# Patient Record
Sex: Female | Born: 1935 | Race: White | Hispanic: No | Smoking: Never smoker
Health system: Southern US, Community
[De-identification: ages and names within clinical notes are randomized; demographics above are authoritative.]

## PROBLEM LIST (undated history)

## (undated) DIAGNOSIS — Z952 Presence of prosthetic heart valve: Secondary | ICD-10-CM

## (undated) DIAGNOSIS — Z950 Presence of cardiac pacemaker: Secondary | ICD-10-CM

## (undated) DIAGNOSIS — Z8673 Personal history of transient ischemic attack (TIA), and cerebral infarction without residual deficits: Secondary | ICD-10-CM

## (undated) DIAGNOSIS — E785 Hyperlipidemia, unspecified: Secondary | ICD-10-CM

## (undated) DIAGNOSIS — I10 Essential (primary) hypertension: Secondary | ICD-10-CM

## (undated) HISTORY — PX: BREAST BIOPSY: SHX20

## (undated) HISTORY — PX: TONSILLECTOMY: SUR1361

## (undated) HISTORY — DX: Essential (primary) hypertension: I10

---

## 2014-01-18 DIAGNOSIS — M76899 Other specified enthesopathies of unspecified lower limb, excluding foot: Secondary | ICD-10-CM | POA: Diagnosis not present

## 2014-03-09 DIAGNOSIS — D259 Leiomyoma of uterus, unspecified: Secondary | ICD-10-CM | POA: Diagnosis not present

## 2014-04-21 DIAGNOSIS — E785 Hyperlipidemia, unspecified: Secondary | ICD-10-CM | POA: Diagnosis not present

## 2014-04-21 DIAGNOSIS — Z8673 Personal history of transient ischemic attack (TIA), and cerebral infarction without residual deficits: Secondary | ICD-10-CM | POA: Diagnosis not present

## 2014-04-21 DIAGNOSIS — Z79899 Other long term (current) drug therapy: Secondary | ICD-10-CM | POA: Diagnosis not present

## 2014-04-21 DIAGNOSIS — E559 Vitamin D deficiency, unspecified: Secondary | ICD-10-CM | POA: Diagnosis not present

## 2014-04-21 DIAGNOSIS — I129 Hypertensive chronic kidney disease with stage 1 through stage 4 chronic kidney disease, or unspecified chronic kidney disease: Secondary | ICD-10-CM | POA: Diagnosis not present

## 2014-04-21 DIAGNOSIS — D649 Anemia, unspecified: Secondary | ICD-10-CM | POA: Diagnosis not present

## 2014-04-21 DIAGNOSIS — N183 Chronic kidney disease, stage 3 (moderate): Secondary | ICD-10-CM | POA: Diagnosis not present

## 2014-07-07 DIAGNOSIS — Z1231 Encounter for screening mammogram for malignant neoplasm of breast: Secondary | ICD-10-CM | POA: Diagnosis not present

## 2014-07-13 DIAGNOSIS — R922 Inconclusive mammogram: Secondary | ICD-10-CM | POA: Diagnosis not present

## 2014-08-18 DIAGNOSIS — R110 Nausea: Secondary | ICD-10-CM | POA: Diagnosis not present

## 2014-08-18 DIAGNOSIS — K5900 Constipation, unspecified: Secondary | ICD-10-CM | POA: Diagnosis not present

## 2014-08-18 DIAGNOSIS — S80862A Insect bite (nonvenomous), left lower leg, initial encounter: Secondary | ICD-10-CM | POA: Diagnosis not present

## 2014-08-18 DIAGNOSIS — R109 Unspecified abdominal pain: Secondary | ICD-10-CM | POA: Diagnosis not present

## 2014-08-18 DIAGNOSIS — R1084 Generalized abdominal pain: Secondary | ICD-10-CM | POA: Diagnosis not present

## 2014-08-18 DIAGNOSIS — S80861A Insect bite (nonvenomous), right lower leg, initial encounter: Secondary | ICD-10-CM | POA: Diagnosis not present

## 2014-08-18 DIAGNOSIS — W57XXXA Bitten or stung by nonvenomous insect and other nonvenomous arthropods, initial encounter: Secondary | ICD-10-CM | POA: Diagnosis not present

## 2014-10-24 DIAGNOSIS — Z79899 Other long term (current) drug therapy: Secondary | ICD-10-CM | POA: Diagnosis not present

## 2014-10-24 DIAGNOSIS — E559 Vitamin D deficiency, unspecified: Secondary | ICD-10-CM | POA: Diagnosis not present

## 2014-10-24 DIAGNOSIS — Z6832 Body mass index (BMI) 32.0-32.9, adult: Secondary | ICD-10-CM | POA: Diagnosis not present

## 2014-10-24 DIAGNOSIS — Z0000 Encounter for general adult medical examination without abnormal findings: Secondary | ICD-10-CM | POA: Diagnosis not present

## 2014-10-24 DIAGNOSIS — R7309 Other abnormal glucose: Secondary | ICD-10-CM | POA: Diagnosis not present

## 2014-10-24 DIAGNOSIS — Z23 Encounter for immunization: Secondary | ICD-10-CM | POA: Diagnosis not present

## 2014-10-24 DIAGNOSIS — Z136 Encounter for screening for cardiovascular disorders: Secondary | ICD-10-CM | POA: Diagnosis not present

## 2014-10-24 DIAGNOSIS — N183 Chronic kidney disease, stage 3 (moderate): Secondary | ICD-10-CM | POA: Diagnosis not present

## 2014-10-24 DIAGNOSIS — E669 Obesity, unspecified: Secondary | ICD-10-CM | POA: Diagnosis not present

## 2014-10-24 DIAGNOSIS — I129 Hypertensive chronic kidney disease with stage 1 through stage 4 chronic kidney disease, or unspecified chronic kidney disease: Secondary | ICD-10-CM | POA: Diagnosis not present

## 2014-10-24 DIAGNOSIS — E785 Hyperlipidemia, unspecified: Secondary | ICD-10-CM | POA: Diagnosis not present

## 2014-10-24 DIAGNOSIS — D649 Anemia, unspecified: Secondary | ICD-10-CM | POA: Diagnosis not present

## 2014-10-26 DIAGNOSIS — H6123 Impacted cerumen, bilateral: Secondary | ICD-10-CM | POA: Diagnosis not present

## 2014-10-27 DIAGNOSIS — Z1211 Encounter for screening for malignant neoplasm of colon: Secondary | ICD-10-CM | POA: Diagnosis not present

## 2015-03-13 DIAGNOSIS — N952 Postmenopausal atrophic vaginitis: Secondary | ICD-10-CM | POA: Diagnosis not present

## 2015-03-13 DIAGNOSIS — R938 Abnormal findings on diagnostic imaging of other specified body structures: Secondary | ICD-10-CM | POA: Diagnosis not present

## 2015-03-13 DIAGNOSIS — D259 Leiomyoma of uterus, unspecified: Secondary | ICD-10-CM | POA: Diagnosis not present

## 2015-03-13 DIAGNOSIS — D252 Subserosal leiomyoma of uterus: Secondary | ICD-10-CM | POA: Diagnosis not present

## 2015-04-13 DIAGNOSIS — I6523 Occlusion and stenosis of bilateral carotid arteries: Secondary | ICD-10-CM | POA: Diagnosis not present

## 2015-04-19 DIAGNOSIS — I6523 Occlusion and stenosis of bilateral carotid arteries: Secondary | ICD-10-CM

## 2015-04-19 DIAGNOSIS — Z7982 Long term (current) use of aspirin: Secondary | ICD-10-CM | POA: Diagnosis not present

## 2015-04-19 DIAGNOSIS — E782 Mixed hyperlipidemia: Secondary | ICD-10-CM

## 2015-04-19 DIAGNOSIS — I10 Essential (primary) hypertension: Secondary | ICD-10-CM | POA: Diagnosis not present

## 2015-04-19 DIAGNOSIS — Z8673 Personal history of transient ischemic attack (TIA), and cerebral infarction without residual deficits: Secondary | ICD-10-CM

## 2015-04-19 HISTORY — DX: Personal history of transient ischemic attack (TIA), and cerebral infarction without residual deficits: Z86.73

## 2015-04-19 HISTORY — DX: Occlusion and stenosis of bilateral carotid arteries: I65.23

## 2015-04-19 HISTORY — DX: Mixed hyperlipidemia: E78.2

## 2015-04-25 DIAGNOSIS — Z9181 History of falling: Secondary | ICD-10-CM | POA: Diagnosis not present

## 2015-04-25 DIAGNOSIS — I129 Hypertensive chronic kidney disease with stage 1 through stage 4 chronic kidney disease, or unspecified chronic kidney disease: Secondary | ICD-10-CM | POA: Diagnosis not present

## 2015-04-25 DIAGNOSIS — Z1389 Encounter for screening for other disorder: Secondary | ICD-10-CM | POA: Diagnosis not present

## 2015-04-25 DIAGNOSIS — Z6832 Body mass index (BMI) 32.0-32.9, adult: Secondary | ICD-10-CM | POA: Diagnosis not present

## 2015-04-25 DIAGNOSIS — N183 Chronic kidney disease, stage 3 (moderate): Secondary | ICD-10-CM | POA: Diagnosis not present

## 2015-04-25 DIAGNOSIS — E559 Vitamin D deficiency, unspecified: Secondary | ICD-10-CM | POA: Diagnosis not present

## 2015-04-25 DIAGNOSIS — Z0000 Encounter for general adult medical examination without abnormal findings: Secondary | ICD-10-CM | POA: Diagnosis not present

## 2015-05-09 DIAGNOSIS — K219 Gastro-esophageal reflux disease without esophagitis: Secondary | ICD-10-CM | POA: Diagnosis not present

## 2015-05-09 DIAGNOSIS — N183 Chronic kidney disease, stage 3 (moderate): Secondary | ICD-10-CM | POA: Diagnosis not present

## 2015-05-09 DIAGNOSIS — I639 Cerebral infarction, unspecified: Secondary | ICD-10-CM | POA: Diagnosis not present

## 2015-05-09 DIAGNOSIS — I10 Essential (primary) hypertension: Secondary | ICD-10-CM | POA: Diagnosis not present

## 2015-05-09 DIAGNOSIS — R011 Cardiac murmur, unspecified: Secondary | ICD-10-CM | POA: Diagnosis not present

## 2015-05-09 DIAGNOSIS — E669 Obesity, unspecified: Secondary | ICD-10-CM | POA: Diagnosis not present

## 2015-05-09 DIAGNOSIS — I129 Hypertensive chronic kidney disease with stage 1 through stage 4 chronic kidney disease, or unspecified chronic kidney disease: Secondary | ICD-10-CM | POA: Diagnosis not present

## 2015-05-09 DIAGNOSIS — E785 Hyperlipidemia, unspecified: Secondary | ICD-10-CM | POA: Diagnosis not present

## 2015-05-11 DIAGNOSIS — R011 Cardiac murmur, unspecified: Secondary | ICD-10-CM | POA: Diagnosis not present

## 2015-05-11 DIAGNOSIS — I350 Nonrheumatic aortic (valve) stenosis: Secondary | ICD-10-CM | POA: Diagnosis not present

## 2015-05-11 DIAGNOSIS — I351 Nonrheumatic aortic (valve) insufficiency: Secondary | ICD-10-CM | POA: Diagnosis not present

## 2015-07-10 DIAGNOSIS — Z1231 Encounter for screening mammogram for malignant neoplasm of breast: Secondary | ICD-10-CM | POA: Diagnosis not present

## 2015-10-10 DIAGNOSIS — Z1389 Encounter for screening for other disorder: Secondary | ICD-10-CM | POA: Diagnosis not present

## 2015-10-10 DIAGNOSIS — N183 Chronic kidney disease, stage 3 (moderate): Secondary | ICD-10-CM | POA: Diagnosis not present

## 2015-10-10 DIAGNOSIS — I639 Cerebral infarction, unspecified: Secondary | ICD-10-CM | POA: Diagnosis not present

## 2015-10-10 DIAGNOSIS — E559 Vitamin D deficiency, unspecified: Secondary | ICD-10-CM | POA: Diagnosis not present

## 2015-10-10 DIAGNOSIS — Z79899 Other long term (current) drug therapy: Secondary | ICD-10-CM | POA: Diagnosis not present

## 2015-10-10 DIAGNOSIS — R5383 Other fatigue: Secondary | ICD-10-CM | POA: Diagnosis not present

## 2015-10-10 DIAGNOSIS — R8290 Unspecified abnormal findings in urine: Secondary | ICD-10-CM | POA: Diagnosis not present

## 2015-10-10 DIAGNOSIS — E785 Hyperlipidemia, unspecified: Secondary | ICD-10-CM | POA: Diagnosis not present

## 2015-10-10 DIAGNOSIS — R269 Unspecified abnormalities of gait and mobility: Secondary | ICD-10-CM | POA: Diagnosis not present

## 2015-10-10 DIAGNOSIS — M810 Age-related osteoporosis without current pathological fracture: Secondary | ICD-10-CM | POA: Diagnosis not present

## 2015-10-10 DIAGNOSIS — K219 Gastro-esophageal reflux disease without esophagitis: Secondary | ICD-10-CM | POA: Diagnosis not present

## 2015-10-10 DIAGNOSIS — I129 Hypertensive chronic kidney disease with stage 1 through stage 4 chronic kidney disease, or unspecified chronic kidney disease: Secondary | ICD-10-CM | POA: Diagnosis not present

## 2015-10-10 DIAGNOSIS — Z23 Encounter for immunization: Secondary | ICD-10-CM | POA: Diagnosis not present

## 2015-10-23 DIAGNOSIS — R2689 Other abnormalities of gait and mobility: Secondary | ICD-10-CM | POA: Diagnosis not present

## 2015-10-23 DIAGNOSIS — M6281 Muscle weakness (generalized): Secondary | ICD-10-CM | POA: Diagnosis not present

## 2015-10-25 DIAGNOSIS — M6281 Muscle weakness (generalized): Secondary | ICD-10-CM | POA: Diagnosis not present

## 2015-10-25 DIAGNOSIS — R2689 Other abnormalities of gait and mobility: Secondary | ICD-10-CM | POA: Diagnosis not present

## 2015-10-30 DIAGNOSIS — M6281 Muscle weakness (generalized): Secondary | ICD-10-CM | POA: Diagnosis not present

## 2015-10-30 DIAGNOSIS — R8290 Unspecified abnormal findings in urine: Secondary | ICD-10-CM | POA: Diagnosis not present

## 2015-10-30 DIAGNOSIS — R2689 Other abnormalities of gait and mobility: Secondary | ICD-10-CM | POA: Diagnosis not present

## 2015-10-31 DIAGNOSIS — Z6834 Body mass index (BMI) 34.0-34.9, adult: Secondary | ICD-10-CM | POA: Diagnosis not present

## 2015-10-31 DIAGNOSIS — E785 Hyperlipidemia, unspecified: Secondary | ICD-10-CM | POA: Diagnosis not present

## 2015-10-31 DIAGNOSIS — Z0000 Encounter for general adult medical examination without abnormal findings: Secondary | ICD-10-CM | POA: Diagnosis not present

## 2015-10-31 DIAGNOSIS — Z136 Encounter for screening for cardiovascular disorders: Secondary | ICD-10-CM | POA: Diagnosis not present

## 2015-10-31 DIAGNOSIS — I10 Essential (primary) hypertension: Secondary | ICD-10-CM | POA: Diagnosis not present

## 2015-11-01 DIAGNOSIS — M6281 Muscle weakness (generalized): Secondary | ICD-10-CM | POA: Diagnosis not present

## 2015-11-01 DIAGNOSIS — R2689 Other abnormalities of gait and mobility: Secondary | ICD-10-CM | POA: Diagnosis not present

## 2015-11-06 DIAGNOSIS — M6281 Muscle weakness (generalized): Secondary | ICD-10-CM | POA: Diagnosis not present

## 2015-11-06 DIAGNOSIS — N390 Urinary tract infection, site not specified: Secondary | ICD-10-CM | POA: Diagnosis not present

## 2015-11-06 DIAGNOSIS — R2689 Other abnormalities of gait and mobility: Secondary | ICD-10-CM | POA: Diagnosis not present

## 2015-11-08 DIAGNOSIS — R2689 Other abnormalities of gait and mobility: Secondary | ICD-10-CM | POA: Diagnosis not present

## 2015-11-08 DIAGNOSIS — M6281 Muscle weakness (generalized): Secondary | ICD-10-CM | POA: Diagnosis not present

## 2015-12-08 DIAGNOSIS — M85851 Other specified disorders of bone density and structure, right thigh: Secondary | ICD-10-CM | POA: Diagnosis not present

## 2015-12-08 DIAGNOSIS — M810 Age-related osteoporosis without current pathological fracture: Secondary | ICD-10-CM | POA: Diagnosis not present

## 2016-03-01 DIAGNOSIS — N183 Chronic kidney disease, stage 3 (moderate): Secondary | ICD-10-CM | POA: Diagnosis not present

## 2016-03-01 DIAGNOSIS — K219 Gastro-esophageal reflux disease without esophagitis: Secondary | ICD-10-CM | POA: Diagnosis not present

## 2016-03-01 DIAGNOSIS — M810 Age-related osteoporosis without current pathological fracture: Secondary | ICD-10-CM | POA: Diagnosis not present

## 2016-03-01 DIAGNOSIS — E559 Vitamin D deficiency, unspecified: Secondary | ICD-10-CM | POA: Diagnosis not present

## 2016-03-01 DIAGNOSIS — I639 Cerebral infarction, unspecified: Secondary | ICD-10-CM | POA: Diagnosis not present

## 2016-03-01 DIAGNOSIS — E785 Hyperlipidemia, unspecified: Secondary | ICD-10-CM | POA: Diagnosis not present

## 2016-03-01 DIAGNOSIS — I129 Hypertensive chronic kidney disease with stage 1 through stage 4 chronic kidney disease, or unspecified chronic kidney disease: Secondary | ICD-10-CM | POA: Diagnosis not present

## 2016-03-06 DIAGNOSIS — E875 Hyperkalemia: Secondary | ICD-10-CM | POA: Diagnosis not present

## 2016-04-18 DIAGNOSIS — I6523 Occlusion and stenosis of bilateral carotid arteries: Secondary | ICD-10-CM | POA: Diagnosis not present

## 2016-04-29 DIAGNOSIS — E785 Hyperlipidemia, unspecified: Secondary | ICD-10-CM | POA: Diagnosis not present

## 2016-04-29 DIAGNOSIS — I10 Essential (primary) hypertension: Secondary | ICD-10-CM | POA: Diagnosis not present

## 2016-05-09 DIAGNOSIS — Z8673 Personal history of transient ischemic attack (TIA), and cerebral infarction without residual deficits: Secondary | ICD-10-CM | POA: Diagnosis not present

## 2016-05-09 DIAGNOSIS — I6523 Occlusion and stenosis of bilateral carotid arteries: Secondary | ICD-10-CM | POA: Diagnosis not present

## 2016-06-06 DIAGNOSIS — E559 Vitamin D deficiency, unspecified: Secondary | ICD-10-CM | POA: Diagnosis not present

## 2016-06-06 DIAGNOSIS — R0602 Shortness of breath: Secondary | ICD-10-CM | POA: Diagnosis not present

## 2016-06-06 DIAGNOSIS — R5383 Other fatigue: Secondary | ICD-10-CM | POA: Diagnosis not present

## 2016-06-06 DIAGNOSIS — R7303 Prediabetes: Secondary | ICD-10-CM | POA: Diagnosis not present

## 2016-06-06 DIAGNOSIS — N183 Chronic kidney disease, stage 3 (moderate): Secondary | ICD-10-CM | POA: Diagnosis not present

## 2016-06-06 DIAGNOSIS — I129 Hypertensive chronic kidney disease with stage 1 through stage 4 chronic kidney disease, or unspecified chronic kidney disease: Secondary | ICD-10-CM | POA: Diagnosis not present

## 2016-06-06 DIAGNOSIS — I350 Nonrheumatic aortic (valve) stenosis: Secondary | ICD-10-CM | POA: Diagnosis not present

## 2016-06-06 DIAGNOSIS — E785 Hyperlipidemia, unspecified: Secondary | ICD-10-CM | POA: Diagnosis not present

## 2016-07-09 ENCOUNTER — Encounter: Payer: Self-pay | Admitting: Cardiology

## 2016-07-09 ENCOUNTER — Ambulatory Visit (INDEPENDENT_AMBULATORY_CARE_PROVIDER_SITE_OTHER): Payer: Medicare Other | Admitting: Cardiology

## 2016-07-09 DIAGNOSIS — I6930 Unspecified sequelae of cerebral infarction: Secondary | ICD-10-CM

## 2016-07-09 DIAGNOSIS — E785 Hyperlipidemia, unspecified: Secondary | ICD-10-CM

## 2016-07-09 DIAGNOSIS — I350 Nonrheumatic aortic (valve) stenosis: Secondary | ICD-10-CM | POA: Diagnosis not present

## 2016-07-09 DIAGNOSIS — I10 Essential (primary) hypertension: Secondary | ICD-10-CM | POA: Diagnosis not present

## 2016-07-09 DIAGNOSIS — R0609 Other forms of dyspnea: Secondary | ICD-10-CM | POA: Diagnosis not present

## 2016-07-09 HISTORY — DX: Nonrheumatic aortic (valve) stenosis: I35.0

## 2016-07-09 HISTORY — DX: Essential (primary) hypertension: I10

## 2016-07-09 HISTORY — DX: Hyperlipidemia, unspecified: E78.5

## 2016-07-09 NOTE — Patient Instructions (Addendum)
Medication Instructions:  Your physician recommends that you continue on your current medications as directed. Please refer to the Current Medication list given to you today.   Labwork: None     Testing/Procedures: Your physician has requested that you have an echocardiogram. Echocardiography is a painless test that uses sound waves to create images of your heart. It provides your doctor with information about the size and shape of your heart and how well your heart's chambers and valves are working. This procedure takes approximately one hour. There are no restrictions for this procedure.    Follow-Up: Your physician recommends that you schedule a follow-up appointment in: 3 weeks     Any Other Special Instructions Will Be Listed Below (If Applicable).     If you need a refill on your cardiac medications before your next appointment, please call your pharmacy.  Echocardiogram An echocardiogram, or echocardiography, uses sound waves (ultrasound) to produce an image of your heart. The echocardiogram is simple, painless, obtained within a short period of time, and offers valuable information to your health care provider. The images from an echocardiogram can provide information such as:  Evidence of coronary artery disease (CAD).  Heart size.  Heart muscle function.  Heart valve function.  Aneurysm detection.  Evidence of a past heart attack.  Fluid buildup around the heart.  Heart muscle thickening.  Assess heart valve function.  Tell a health care provider about:  Any allergies you have.  All medicines you are taking, including vitamins, herbs, eye drops, creams, and over-the-counter medicines.  Any problems you or family members have had with anesthetic medicines.  Any blood disorders you have.  Any surgeries you have had.  Any medical conditions you have.  Whether you are pregnant or may be pregnant. What happens before the procedure? No special  preparation is needed. Eat and drink normally. What happens during the procedure?  In order to produce an image of your heart, gel will be applied to your chest and a wand-like tool (transducer) will be moved over your chest. The gel will help transmit the sound waves from the transducer. The sound waves will harmlessly bounce off your heart to allow the heart images to be captured in real-time motion. These images will then be recorded.  You may need an IV to receive a medicine that improves the quality of the pictures. What happens after the procedure? You may return to your normal schedule including diet, activities, and medicines, unless your health care provider tells you otherwise. This information is not intended to replace advice given to you by your health care provider. Make sure you discuss any questions you have with your health care provider. Document Released: 12/22/1999 Document Revised: 08/12/2015 Document Reviewed: 08/31/2012 Elsevier Interactive Patient Education  2017 [employer].

## 2016-07-09 NOTE — Progress Notes (Signed)
Cardiology Consultation:    Date:  07/09/2016   ID:  Trish Fountain, DOB 1936/01/07, MRN [id number]  PCP:  Maylon Cos, NP  Cardiologist:  Jenne Campus, MD   Referring MD: Maylon Cos, NP   Chief Complaint  Patient presents with  . Fatigue  . Shortness of Breath  Increased shortness of breath, history of aortic stenosis  History of Present Illness:    Robin Sutton is a 81 y.o. female who is being seen today for the evaluation of Shortness of breath at the request of Maylon Cos, NP. She's been complaining of having some shortness of breath that is gradually getting worse for the last 6 months. She is down grading no symptoms over her daughter was with her in the room clearly stated that her shortness of breath significantly worsened. As having the chest pain, tightness, squeezing, pressure burning in the chest, no dizziness or passing out. She can walk in [place] from front to the back very slowly but without any symptoms. Apparently she was diagnosed with aortic stenosis last year and estimation was moderate at the time. Obviously now concern is about potentially having severe aortic stenosis. I asked her to avoid any strenuous exercise. Will get echocardiogram to assess that.  Past Medical History:  Diagnosis Date  . Hypertension     Past Surgical History:  Procedure Laterality Date  . BREAST BIOPSY    . TONSILLECTOMY      Current Medications: Current Meds  Medication Sig  . aspirin 325 MG tablet Take 325 mg by mouth daily.  Marland Kitchen lisinopril (PRINIVIL,ZESTRIL) 10 MG tablet Take 10 mg by mouth daily.  Marland Kitchen omeprazole (PRILOSEC) 20 MG capsule Take 20 mg by mouth daily.  . polyethylene glycol powder (GLYCOLAX/MIRALAX) powder Take 17 g by mouth as needed for constipation.  . pravastatin (PRAVACHOL) 40 MG tablet Take 40 mg by mouth daily.  . Vitamin D, Ergocalciferol, (DRISDOL) 50000 units CAPS capsule Take 50,000 Units by mouth once a week.     Allergies:    Patient has no known allergies.   Social History   Social History  . Marital status: Widowed    Spouse name: N/A  . Number of children: N/A  . Years of education: N/A   Social History Main Topics  . Smoking status: Never Smoker  . Smokeless tobacco: Never Used  . Alcohol use No  . Drug use: No  . Sexual activity: Not Asked   Other Topics Concern  . None   Social History Narrative  . None     Family History: The patient's family history includes Diabetes in her maternal grandfather; Heart disease in her brother, maternal grandfather, and mother; Hypertension in her brother and mother; Kidney failure in her father; Stroke in her mother. ROS:   Please see the history of present illness.     All other systems reviewed and are negative.    Recent Labs: No results found for requested labs within last 8760 hours.  Recent Lipid Panel No results found for: CHOL, TRIG, HDL, CHOLHDL, VLDL, LDLCALC, LDLDIRECT  Physical Exam:    VS:  BP 120/64   Pulse 76   Resp 12   Ht 5\' 3"  (1.6 m)   Wt 187 lb (84.8 kg)   BMI 33.13 kg/m     Wt Readings from Last 3 Encounters:  07/09/16 187 lb (84.8 kg)     GEN:  Well nourished, well developed in no acute distress HEENT: Normal NECK: No JVD;  No carotid bruits LYMPHATICS: No lymphadenopathy CARDIAC: RRR, There is systolic ejection murmur grade 3/6 best heard at the right upper portion of the sternum, S3 still present, radiation is to worsen next, there is also a holosystolic murmur best heart left border disturbance grade 1-2/6., no rubs, no gallops RESPIRATORY:  Clear to auscultation without rales, wheezing or rhonchi  ABDOMEN: Soft, non-tender, non-distended MUSCULOSKELETAL:  No edema; No deformity  SKIN: Warm and dry NEUROLOGIC:  Alert and oriented x 3 PSYCHIATRIC:  Normal affect   ASSESSMENT:    1. Nonrheumatic aortic valve stenosis   2. Dyspnea on exertion   3. Dyslipidemia   4. Essential hypertension   5. Late effect of  cerebrovascular accident (CVA)    PLAN:    In order of problems listed above:  1. Aortic stenosis: Was moderate last year S2 is still present however her symptoms that she develops a really very concerning. I asked her to avoid any strenuous exercise. Will get echocardiogram to assess the valve. I'm oriented to ridge significant aortic stenosis and she will require some intervention. 2. Essential hypertension: On appropriate medications which I will continue. 3. Dyslipidemia. LDL last assessment was 06/06/2016 was 117. She is ulnar pravastatin most likely will increase the dose of the medications which were tomorrow fact stopped. 4. Late effect of CVA she did have a stroke about 6 years ago affected the right side of her body. Her however quite nicely. The etiology of CVAs and clear. Oversee atrial fibrillation should be considered. Will look at the size of the left atrium and see if she will quite any monitoring device trended them and if she does have any evidence of atrial fibrillation.  Lytic into my office referral because of increased shortness of breath she does have aortic stenosis more she may have significant aortic stenosis obviously echocardiogram will be down to assess this issue. I will see her back in THE next 3-4 weeks. Office leave her aortic stenosis critical will see her much quicker.   Medication Adjustments/Labs and Tests Ordered: Current medicines are reviewed at length with the patient today.  Concerns regarding medicines are outlined above.  No orders of the defined types were placed in this encounter.  No orders of the defined types were placed in this encounter.   Signed, Jenne Campus, MD  07/09/2016 10:38 AM    [facility]

## 2016-07-16 DIAGNOSIS — Z1231 Encounter for screening mammogram for malignant neoplasm of breast: Secondary | ICD-10-CM | POA: Diagnosis not present

## 2016-07-18 ENCOUNTER — Ambulatory Visit (HOSPITAL_BASED_OUTPATIENT_CLINIC_OR_DEPARTMENT_OTHER)
Admission: RE | Admit: 2016-07-18 | Discharge: 2016-07-18 | Disposition: A | Discharge status: Home / Self Care | Payer: Medicare Other | Source: Ambulatory Visit | Attending: Cardiology | Admitting: Cardiology

## 2016-07-18 DIAGNOSIS — I351 Nonrheumatic aortic (valve) insufficiency: Secondary | ICD-10-CM | POA: Diagnosis not present

## 2016-07-18 DIAGNOSIS — I350 Nonrheumatic aortic (valve) stenosis: Secondary | ICD-10-CM | POA: Diagnosis not present

## 2016-07-18 NOTE — Progress Notes (Signed)
  Echocardiogram 2D Echocardiogram has been performed.  Donata Clay 07/18/2016, 2:13 PM

## 2016-07-31 ENCOUNTER — Ambulatory Visit (INDEPENDENT_AMBULATORY_CARE_PROVIDER_SITE_OTHER): Payer: Medicare Other | Admitting: Cardiology

## 2016-07-31 ENCOUNTER — Encounter: Payer: Self-pay | Admitting: Cardiology

## 2016-07-31 VITALS — BP 110/60 | HR 60 | Resp 10 | Ht 63.0 in | Wt 187.8 lb

## 2016-07-31 DIAGNOSIS — I10 Essential (primary) hypertension: Secondary | ICD-10-CM

## 2016-07-31 DIAGNOSIS — R0609 Other forms of dyspnea: Secondary | ICD-10-CM

## 2016-07-31 DIAGNOSIS — I350 Nonrheumatic aortic (valve) stenosis: Secondary | ICD-10-CM

## 2016-07-31 DIAGNOSIS — I6930 Unspecified sequelae of cerebral infarction: Secondary | ICD-10-CM | POA: Diagnosis not present

## 2016-07-31 NOTE — Patient Instructions (Signed)
Medication Instructions:  Your physician recommends that you continue on your current medications as directed. Please refer to the Current Medication list given to you today.  Labwork: None   Testing/Procedures: None   Follow-Up: Your physician recommends that you schedule a follow-up appointment in: 1 month with Dr. Agustin Cree.   Any Other Special Instructions Will Be Listed Below (If Applicable).     If you need a refill on your cardiac medications before your next appointment, please call your pharmacy.

## 2016-07-31 NOTE — Progress Notes (Signed)
Cardiology Office Note:    Date:  07/31/2016   ID:  Robin Sutton, DOB 1935-07-20, MRN [id number]  PCP:  Maylon Cos, NP  Cardiologist:  Jenne Campus, MD    Referring MD: Maylon Cos, NP   Chief Complaint  Patient presents with  . 3 week follow up  I'm still having shortness of breath  History of Present Illness:    Robin Sutton is a 81 y.o. female  with severe aortic stenosis. She did have echocardiogram which confirmed the diagnosis. She still does have exertional shortness of breath but no chest pain no passing out. She was in the room with her daughter. I explained to her what the options are option being open heart surgery to fix the valve, potentially trans-angiographic valve replacement, she told me straight that she does not want to have anything done about that. I told her that survival of people with aortic stenosis which is critical is about 3-5 years from the moment that the developed symptoms. She told me that she's had to reduce outcome. I asked her to think about it and let me know in about a month how she wanted to proceed. I also told her there is not much medication Y secondary to help her with this.  Past Medical History:  Diagnosis Date  . Hypertension     Past Surgical History:  Procedure Laterality Date  . BREAST BIOPSY    . TONSILLECTOMY      Current Medications: Current Meds  Medication Sig  . aspirin 325 MG tablet Take 325 mg by mouth daily.  Marland Kitchen lisinopril (PRINIVIL,ZESTRIL) 10 MG tablet Take 10 mg by mouth daily.  Marland Kitchen omeprazole (PRILOSEC) 20 MG capsule Take 20 mg by mouth daily.  . polyethylene glycol powder (GLYCOLAX/MIRALAX) powder Take 17 g by mouth as needed for constipation.  . pravastatin (PRAVACHOL) 40 MG tablet Take 40 mg by mouth daily.  . Vitamin D, Ergocalciferol, (DRISDOL) 50000 units CAPS capsule Take 50,000 Units by mouth once a week.     Allergies:   Patient has no known allergies.   Social History   Social  History  . Marital status: Widowed    Spouse name: N/A  . Number of children: N/A  . Years of education: N/A   Social History Main Topics  . Smoking status: Never Smoker  . Smokeless tobacco: Never Used  . Alcohol use No  . Drug use: No  . Sexual activity: Not Asked   Other Topics Concern  . None   Social History Narrative  . None     Family History: The patient's family history includes Diabetes in her maternal grandfather; Heart disease in her brother, maternal grandfather, and mother; Hypertension in her brother and mother; Kidney failure in her father; Stroke in her mother. ROS:   Please see the history of present illness.    All 14 point review of systems negative except as described per history of present illness  EKGs/Labs/Other Studies Reviewed:      Recent Labs: No results found for requested labs within last 8760 hours.  Recent Lipid Panel No results found for: CHOL, TRIG, HDL, CHOLHDL, VLDL, LDLCALC, LDLDIRECT  Physical Exam:    VS:  BP 110/60   Pulse 60   Resp 10   Ht 5\' 3"  (1.6 m)   Wt 187 lb 12.8 oz (85.2 kg)   BMI 33.27 kg/m     Wt Readings from Last 3 Encounters:  07/31/16 187 lb 12.8 oz (85.2 kg)  07/09/16 187 lb (84.8 kg)     GEN:  Well nourished, well developed in no acute distress HEENT: Normal NECK: No JVD; No carotid bruits LYMPHATICS: No lymphadenopathy CARDIAC: RRR, Systolic ejection murmur grade 3/6 best heard right upper portion of the sternum, no rubs, no gallops RESPIRATORY:  Clear to auscultation without rales, wheezing or rhonchi  ABDOMEN: Soft, non-tender, non-distended MUSCULOSKELETAL:  No edema; No deformity  SKIN: Warm and dry LOWER EXTREMITIES: no swelling NEUROLOGIC:  Alert and oriented x 3 PSYCHIATRIC:  Normal affect   ASSESSMENT:    1. Nonrheumatic aortic valve stenosis    PLAN:    In order of problems listed above:  1. Severe aortic stenosis: Patient refusing surgery. Again I will continue conversation about  this. I'll bring bring her back in my office in about one month. That she will avoid excess exercise. 2. Essential hypertension, on appropriate medications which I'll continue. 3. Dyspnea on exertion most likely caused by severe aortic stenosis. 4. Late effects of CVA. No new findings.   Medication Adjustments/Labs and Tests Ordered: Current medicines are reviewed at length with the patient today.  Concerns regarding medicines are outlined above.  No orders of the defined types were placed in this encounter.  Medication changes: No orders of the defined types were placed in this encounter.   Signed, Park Liter, MD, [facility] 07/31/2016 1:53 PM    [place]

## 2016-08-19 ENCOUNTER — Telehealth: Payer: Self-pay

## 2016-08-19 NOTE — Telephone Encounter (Signed)
Advised pt we would discuss at the upcoming f/u.

## 2016-08-19 NOTE — Telephone Encounter (Signed)
Patient called to set up operation discussed at last ov , please call back to do so.cn

## 2016-09-02 ENCOUNTER — Ambulatory Visit (INDEPENDENT_AMBULATORY_CARE_PROVIDER_SITE_OTHER): Payer: Medicare Other | Admitting: Cardiology

## 2016-09-02 ENCOUNTER — Encounter: Payer: Self-pay | Admitting: Cardiology

## 2016-09-02 ENCOUNTER — Ambulatory Visit (HOSPITAL_BASED_OUTPATIENT_CLINIC_OR_DEPARTMENT_OTHER)
Admission: RE | Admit: 2016-09-02 | Discharge: 2016-09-02 | Disposition: A | Discharge status: Home / Self Care | Payer: Medicare Other | Source: Ambulatory Visit | Attending: Cardiology | Admitting: Cardiology

## 2016-09-02 VITALS — BP 116/64 | HR 84 | Resp 14 | Ht 63.0 in | Wt 186.0 lb

## 2016-09-02 DIAGNOSIS — I350 Nonrheumatic aortic (valve) stenosis: Secondary | ICD-10-CM

## 2016-09-02 DIAGNOSIS — E785 Hyperlipidemia, unspecified: Secondary | ICD-10-CM

## 2016-09-02 DIAGNOSIS — R0602 Shortness of breath: Secondary | ICD-10-CM | POA: Diagnosis not present

## 2016-09-02 DIAGNOSIS — R0609 Other forms of dyspnea: Secondary | ICD-10-CM | POA: Diagnosis not present

## 2016-09-02 DIAGNOSIS — I10 Essential (primary) hypertension: Secondary | ICD-10-CM | POA: Diagnosis not present

## 2016-09-02 DIAGNOSIS — I6930 Unspecified sequelae of cerebral infarction: Secondary | ICD-10-CM | POA: Diagnosis not present

## 2016-09-02 DIAGNOSIS — R918 Other nonspecific abnormal finding of lung field: Secondary | ICD-10-CM | POA: Insufficient documentation

## 2016-09-02 DIAGNOSIS — Z01810 Encounter for preprocedural cardiovascular examination: Secondary | ICD-10-CM | POA: Insufficient documentation

## 2016-09-02 DIAGNOSIS — I700 Atherosclerosis of aorta: Secondary | ICD-10-CM | POA: Insufficient documentation

## 2016-09-02 NOTE — Patient Instructions (Addendum)
Medication Instructions:  Your physician recommends that you continue on your current medications as directed. Please refer to the Current Medication list given to you today.   Labwork: Your physician recommends that you return for lab work in: today. CBC, CMP, pt/inr   Testing/Procedures: A chest x-ray takes a picture of the organs and structures inside the chest, including the heart, lungs, and blood vessels. This test can show several things, including, whether the heart is enlarges; whether fluid is building up in the lungs; and whether pacemaker / defibrillator leads are still in place.  Your physician has requested that you have a cardiac catheterization. Cardiac catheterization is used to diagnose and/or treat various heart conditions. Doctors may recommend this procedure for a number of different reasons. The most common reason is to evaluate chest pain. Chest pain can be a symptom of coronary artery disease (CAD), and cardiac catheterization can show whether plaque is narrowing or blocking your heart's arteries. This procedure is also used to evaluate the valves, as well as measure the blood flow and oxygen levels in different parts of your heart. For further information please visit [facility]. Please follow instruction sheet, as given.   Follow-Up: Your physician recommends that you schedule a follow-up appointment in: 1 month.  Any Other Special Instructions Will Be Listed Below (If Applicable).     If you need a refill on your cardiac medications before your next appointment, please call your pharmacy.

## 2016-09-02 NOTE — Progress Notes (Signed)
Cardiology Office Note:    Date:  09/02/2016   ID:  Marisa Hage, DOB [birth year], MRN [id number]  PCP:  Maylon Cos, NP  Cardiologist:  Jenne Campus, MD    Referring MD: Maylon Cos, NP   Chief Complaint  Patient presents with  . 1 month follow up  She's here to tell about the aortic stenosis  History of Present Illness:    Robin Sutton is a 81 y.o. female  with significant aortic stenosis, latest echocardiogram showed mean gradient of 47. I met with her month ago we talked about options in this situation. She was unable to make a decision last time therefore I wanted to go home and think about it. Today she is coming to the decision. She would like to proceed with evaluation for aortic valve replacement. Told her that the first step is to cardiac catheterization which we will remeasure gradient across the aortic valve and more importantly check her coronary arteries. Based on that will know what her options will have. We talked about mechanical versus biological  Valve. We talked about cardiac catheterization, I expected all risks benefits as well as alternatives. We also talked about TAVR and hybrid procedure.  Past Medical History:  Diagnosis Date  . Hypertension     Past Surgical History:  Procedure Laterality Date  . BREAST BIOPSY    . TONSILLECTOMY      Current Medications: Current Meds  Medication Sig  . aspirin 325 MG tablet Take 325 mg by mouth daily.  Marland Kitchen lisinopril (PRINIVIL,ZESTRIL) 10 MG tablet Take 10 mg by mouth daily.  Marland Kitchen omeprazole (PRILOSEC) 20 MG capsule Take 20 mg by mouth daily.  . polyethylene glycol powder (GLYCOLAX/MIRALAX) powder Take 17 g by mouth as needed for constipation.  . pravastatin (PRAVACHOL) 40 MG tablet Take 40 mg by mouth daily.  . Vitamin D, Ergocalciferol, (DRISDOL) 50000 units CAPS capsule Take 50,000 Units by mouth once a week.     Allergies:   Patient has no known allergies.   Social History   Social History    . Marital status: Widowed    Spouse name: N/A  . Number of children: N/A  . Years of education: N/A   Social History Main Topics  . Smoking status: Never Smoker  . Smokeless tobacco: Never Used  . Alcohol use No  . Drug use: No  . Sexual activity: Not Asked   Other Topics Concern  . None   Social History Narrative  . None     Family History: The patient's family history includes Diabetes in her maternal grandfather; Heart disease in her brother, maternal grandfather, and mother; Hypertension in her brother and mother; Kidney failure in her father; Stroke in her mother. ROS:   Please see the history of present illness.    All 14 point review of systems negative except as described per history of present illness  EKGs/Labs/Other Studies Reviewed:      Recent Labs: No results found for requested labs within last 8760 hours.  Recent Lipid Panel No results found for: CHOL, TRIG, HDL, CHOLHDL, VLDL, LDLCALC, LDLDIRECT  Physical Exam:    VS:  BP 116/64   Pulse 84   Resp 14   Ht _0  (1.6 m)   Wt 186 lb (84.4 kg)   BMI 32.95 kg/m     Wt Readings from Last 3 Encounters:  09/02/16 186 lb (84.4 kg)  07/31/16 187 lb 12.8 oz (85.2 kg)  07/09/16 187 lb (84.8 kg)  GEN:  Well nourished, well developed in no acute distress HEENT: Normal NECK: No JVD; No carotid bruits LYMPHATICS: No lymphadenopathy CARDIAC: RRR, Systolic murmur grade 3/6 with radiation towards the neck, no rubs, no gallops RESPIRATORY:  Clear to auscultation without rales, wheezing or rhonchi  ABDOMEN: Soft, non-tender, non-distended MUSCULOSKELETAL:  No edema; No deformity  SKIN: Warm and dry LOWER EXTREMITIES: no swelling NEUROLOGIC:  Alert and oriented x 3 PSYCHIATRIC:  Normal affect   ASSESSMENT:    1. Nonrheumatic aortic valve stenosis   2. Essential hypertension   3. Dyslipidemia   4. Late effect of cerebrovascular accident (CVA)   5. Dyspnea on exertion    PLAN:    In order of  problems listed above:  1. Nonrheumatic aortic stenosis which appears to be severe. She agreed to have a cardiac catheterization to be evaluated for aortic valve replacement. 2. Essential hypertension: Blood pressure well-controlled continue present management. 3. Dyslipidemia: On pravastatin which I'll continue. 4. Late effects of CVA only minimal changes. 5. Dyspnea on exertion most likely related to aortic stenosis   Medication Adjustments/Labs and Tests Ordered: Current medicines are reviewed at length with the patient today.  Concerns regarding medicines are outlined above.  No orders of the defined types were placed in this encounter.  Medication changes: No orders of the defined types were placed in this encounter.   Signed, Park Liter, MD, [facility] 09/02/2016 1:54 PM    [place]

## 2016-09-03 ENCOUNTER — Telehealth: Payer: Self-pay

## 2016-09-03 DIAGNOSIS — R9389 Abnormal findings on diagnostic imaging of other specified body structures: Secondary | ICD-10-CM

## 2016-09-03 LAB — CBC WITH DIFFERENTIAL/PLATELET
BASOS ABS: 0 10*3/uL (ref 0.0–0.2)
Basos: 0 %
EOS (ABSOLUTE): 0.3 10*3/uL (ref 0.0–0.4)
Eos: 5 %
HEMOGLOBIN: 13.2 g/dL (ref 11.1–15.9)
Hematocrit: 39.3 % (ref 34.0–46.6)
IMMATURE GRANS (ABS): 0 10*3/uL (ref 0.0–0.1)
Immature Granulocytes: 0 %
LYMPHS ABS: 1.4 10*3/uL (ref 0.7–3.1)
LYMPHS: 26 %
MCH: 31.1 pg (ref 26.6–33.0)
MCHC: 33.6 g/dL (ref 31.5–35.7)
MCV: 93 fL (ref 79–97)
MONOCYTES: 11 %
Monocytes Absolute: 0.6 10*3/uL (ref 0.1–0.9)
Neutrophils Absolute: 3.1 10*3/uL (ref 1.4–7.0)
Neutrophils: 58 %
Platelets: 184 10*3/uL (ref 150–379)
RBC: 4.25 x10E6/uL (ref 3.77–5.28)
RDW: 13.8 % (ref 12.3–15.4)
WBC: 5.4 10*3/uL (ref 3.4–10.8)

## 2016-09-03 LAB — COMPREHENSIVE METABOLIC PANEL
A/G RATIO: 2.1 (ref 1.2–2.2)
ALBUMIN: 4.5 g/dL (ref 3.5–4.8)
ALT: 19 IU/L (ref 0–32)
AST: 24 IU/L (ref 0–40)
Alkaline Phosphatase: 70 IU/L (ref 39–117)
BUN / CREAT RATIO: 19 (ref 12–28)
BUN: 18 mg/dL (ref 8–27)
Bilirubin Total: 0.4 mg/dL (ref 0.0–1.2)
CALCIUM: 9.4 mg/dL (ref 8.7–10.3)
CO2: 25 mmol/L (ref 20–29)
CREATININE: 0.94 mg/dL (ref 0.57–1.00)
Chloride: 102 mmol/L (ref 96–106)
GFR calc Af Amer: 67 mL/min/{1.73_m2} (ref >59)
GFR, EST NON AFRICAN AMERICAN: 58 mL/min/{1.73_m2} — AB (ref >59)
GLOBULIN, TOTAL: 2.1 g/dL (ref 1.5–4.5)
Glucose: 119 mg/dL — ABNORMAL HIGH (ref 65–99)
Potassium: 4.9 mmol/L (ref 3.5–5.2)
SODIUM: 142 mmol/L (ref 134–144)
Total Protein: 6.6 g/dL (ref 6.0–8.5)

## 2016-09-03 NOTE — Telephone Encounter (Signed)
Advised pt that Dr. Agustin Cree would like repeat C-Xray on Friday of this week.

## 2016-09-03 NOTE — Telephone Encounter (Signed)
-----   Message from Park Liter, MD sent at 09/03/2016 11:30 AM EDT ----- cxr showed round density in rt base, needs to have cxr repeated in few days

## 2016-09-06 ENCOUNTER — Ambulatory Visit (HOSPITAL_BASED_OUTPATIENT_CLINIC_OR_DEPARTMENT_OTHER)
Admission: RE | Admit: 2016-09-06 | Discharge: 2016-09-06 | Disposition: A | Discharge status: Home / Self Care | Payer: Medicare Other | Source: Ambulatory Visit | Attending: Cardiology | Admitting: Cardiology

## 2016-09-06 DIAGNOSIS — E785 Hyperlipidemia, unspecified: Secondary | ICD-10-CM | POA: Diagnosis not present

## 2016-09-06 DIAGNOSIS — R938 Abnormal findings on diagnostic imaging of other specified body structures: Secondary | ICD-10-CM | POA: Diagnosis not present

## 2016-09-06 DIAGNOSIS — R9389 Abnormal findings on diagnostic imaging of other specified body structures: Secondary | ICD-10-CM

## 2016-09-06 DIAGNOSIS — E559 Vitamin D deficiency, unspecified: Secondary | ICD-10-CM | POA: Diagnosis not present

## 2016-09-06 DIAGNOSIS — Z6832 Body mass index (BMI) 32.0-32.9, adult: Secondary | ICD-10-CM | POA: Diagnosis not present

## 2016-09-06 DIAGNOSIS — I129 Hypertensive chronic kidney disease with stage 1 through stage 4 chronic kidney disease, or unspecified chronic kidney disease: Secondary | ICD-10-CM | POA: Diagnosis not present

## 2016-09-06 DIAGNOSIS — N183 Chronic kidney disease, stage 3 (moderate): Secondary | ICD-10-CM | POA: Diagnosis not present

## 2016-09-06 DIAGNOSIS — Z9181 History of falling: Secondary | ICD-10-CM | POA: Diagnosis not present

## 2016-09-06 DIAGNOSIS — R0602 Shortness of breath: Secondary | ICD-10-CM | POA: Diagnosis not present

## 2016-09-06 DIAGNOSIS — Z1211 Encounter for screening for malignant neoplasm of colon: Secondary | ICD-10-CM | POA: Diagnosis not present

## 2016-09-06 DIAGNOSIS — I350 Nonrheumatic aortic (valve) stenosis: Secondary | ICD-10-CM | POA: Diagnosis not present

## 2016-09-06 DIAGNOSIS — Z1389 Encounter for screening for other disorder: Secondary | ICD-10-CM | POA: Diagnosis not present

## 2016-09-06 DIAGNOSIS — R7303 Prediabetes: Secondary | ICD-10-CM | POA: Diagnosis not present

## 2016-09-07 DIAGNOSIS — Z1211 Encounter for screening for malignant neoplasm of colon: Secondary | ICD-10-CM | POA: Diagnosis not present

## 2016-09-10 ENCOUNTER — Telehealth: Payer: Self-pay

## 2016-09-10 NOTE — Telephone Encounter (Signed)
Left detailed message per DPR.    Patient contacted pre-catheterization at [facility] scheduled for:  09/11/2016 @ 1300 Verified arrival time and place:  NT @ 1100 Confirmed AM meds to be taken pre-cath with sip of water: Take ASA 325  Patient must have responsible person to drive home post procedure and observe patient for 24 hours Addl concerns:  Left this nurse name and # if any further questions

## 2016-09-11 ENCOUNTER — Encounter (HOSPITAL_COMMUNITY)
Admission: RE | Disposition: A | Discharge status: Home / Self Care | Payer: Self-pay | Source: Ambulatory Visit | Attending: Cardiovascular Disease

## 2016-09-11 ENCOUNTER — Ambulatory Visit (HOSPITAL_COMMUNITY)
Admission: RE | Admit: 2016-09-11 | Discharge: 2016-09-11 | Disposition: A | Discharge status: Home / Self Care | Payer: Medicare Other | Source: Ambulatory Visit | Attending: Cardiovascular Disease | Admitting: Cardiovascular Disease

## 2016-09-11 DIAGNOSIS — I10 Essential (primary) hypertension: Secondary | ICD-10-CM | POA: Diagnosis not present

## 2016-09-11 DIAGNOSIS — I350 Nonrheumatic aortic (valve) stenosis: Secondary | ICD-10-CM | POA: Diagnosis not present

## 2016-09-11 DIAGNOSIS — Z8249 Family history of ischemic heart disease and other diseases of the circulatory system: Secondary | ICD-10-CM | POA: Insufficient documentation

## 2016-09-11 DIAGNOSIS — E785 Hyperlipidemia, unspecified: Secondary | ICD-10-CM | POA: Insufficient documentation

## 2016-09-11 DIAGNOSIS — I2584 Coronary atherosclerosis due to calcified coronary lesion: Secondary | ICD-10-CM | POA: Diagnosis not present

## 2016-09-11 DIAGNOSIS — Z7982 Long term (current) use of aspirin: Secondary | ICD-10-CM | POA: Diagnosis not present

## 2016-09-11 DIAGNOSIS — I2510 Atherosclerotic heart disease of native coronary artery without angina pectoris: Secondary | ICD-10-CM | POA: Insufficient documentation

## 2016-09-11 HISTORY — PX: RIGHT/LEFT HEART CATH AND CORONARY ANGIOGRAPHY: CATH118266

## 2016-09-11 LAB — POCT I-STAT 3, ART BLOOD GAS (G3+)
Bicarbonate: 24.3 mmol/L (ref 20.0–28.0)
O2 Saturation: 91 %
PCO2 ART: 38.8 mmHg (ref 32.0–48.0)
PH ART: 7.404 (ref 7.350–7.450)
TCO2: 25 mmol/L (ref 22–32)
pO2, Arterial: 61 mmHg — ABNORMAL LOW (ref 83.0–108.0)

## 2016-09-11 LAB — POCT I-STAT 3, VENOUS BLOOD GAS (G3P V)
ACID-BASE DEFICIT: 1 mmol/L (ref 0.0–2.0)
Bicarbonate: 23.3 mmol/L (ref 20.0–28.0)
O2 SAT: 66 %
PCO2 VEN: 36.7 mmHg — AB (ref 44.0–60.0)
PH VEN: 7.411 (ref 7.250–7.430)
PO2 VEN: 34 mmHg (ref 32.0–45.0)
TCO2: 24 mmol/L (ref 22–32)

## 2016-09-11 LAB — PROTIME-INR
INR: 1.11
PROTHROMBIN TIME: 14.2 s (ref 11.4–15.2)

## 2016-09-11 SURGERY — RIGHT/LEFT HEART CATH AND CORONARY ANGIOGRAPHY
Anesthesia: LOCAL

## 2016-09-11 MED ORDER — HEPARIN SODIUM (PORCINE) 1000 UNIT/ML IJ SOLN
INTRAMUSCULAR | Status: AC
  Filled 2016-09-11: qty 1

## 2016-09-11 MED ORDER — LIDOCAINE HCL (PF) 1 % IJ SOLN
INTRAMUSCULAR | Status: AC
  Filled 2016-09-11: qty 30

## 2016-09-11 MED ORDER — MIDAZOLAM HCL 2 MG/2ML IJ SOLN
INTRAMUSCULAR | Status: DC | PRN
  Administered 2016-09-11: 1 mg via INTRAVENOUS

## 2016-09-11 MED ORDER — HEPARIN (PORCINE) IN NACL 2-0.9 UNIT/ML-% IJ SOLN
INTRAMUSCULAR | Status: AC | PRN
  Administered 2016-09-11: 1000 mL

## 2016-09-11 MED ORDER — HEPARIN SODIUM (PORCINE) 1000 UNIT/ML IJ SOLN
INTRAMUSCULAR | Status: DC | PRN
  Administered 2016-09-11: 4000 [IU] via INTRAVENOUS

## 2016-09-11 MED ORDER — SODIUM CHLORIDE 0.9 % WEIGHT BASED INFUSION: 1.0000 mL/kg/h | INTRAVENOUS | Status: DC

## 2016-09-11 MED ORDER — ASPIRIN 81 MG PO CHEW: 324.0000 mg | CHEWABLE_TABLET | ORAL | Status: DC

## 2016-09-11 MED ORDER — VERAPAMIL HCL 2.5 MG/ML IV SOLN
INTRAVENOUS | Status: AC
  Filled 2016-09-11: qty 2

## 2016-09-11 MED ORDER — ONDANSETRON HCL 4 MG/2ML IJ SOLN: 4.0000 mg | Freq: Four times a day (QID) | INTRAMUSCULAR | Status: DC | PRN

## 2016-09-11 MED ORDER — SODIUM CHLORIDE 0.9 % IV SOLN: 250.0000 mL | INTRAVENOUS | Status: DC | PRN

## 2016-09-11 MED ORDER — IODIXANOL 320 MG/ML IV SOLN
INTRAVENOUS | Status: DC | PRN
  Administered 2016-09-11: 60 mL via INTRA_ARTERIAL

## 2016-09-11 MED ORDER — SODIUM CHLORIDE 0.9% FLUSH: 3.0000 mL | INTRAVENOUS | Status: DC | PRN

## 2016-09-11 MED ORDER — HEPARIN (PORCINE) IN NACL 2-0.9 UNIT/ML-% IJ SOLN
INTRAMUSCULAR | Status: AC
  Filled 2016-09-11: qty 1000

## 2016-09-11 MED ORDER — LIDOCAINE HCL (PF) 1 % IJ SOLN
INTRAMUSCULAR | Status: DC | PRN
  Administered 2016-09-11 (×2): 2 mL

## 2016-09-11 MED ORDER — IOPAMIDOL (ISOVUE-370) INJECTION 76%
INTRAVENOUS | Status: AC
  Filled 2016-09-11: qty 100

## 2016-09-11 MED ORDER — MIDAZOLAM HCL 2 MG/2ML IJ SOLN
INTRAMUSCULAR | Status: AC
  Filled 2016-09-11: qty 2

## 2016-09-11 MED ORDER — SODIUM CHLORIDE 0.9 % WEIGHT BASED INFUSION
3.0000 mL/kg/h | INTRAVENOUS | Status: AC
  Administered 2016-09-11: 3 mL/kg/h via INTRAVENOUS

## 2016-09-11 MED ORDER — FENTANYL CITRATE (PF) 100 MCG/2ML IJ SOLN
INTRAMUSCULAR | Status: DC | PRN
  Administered 2016-09-11: 25 ug via INTRAVENOUS

## 2016-09-11 MED ORDER — ACETAMINOPHEN 325 MG PO TABS: 650.0000 mg | ORAL_TABLET | ORAL | Status: DC | PRN

## 2016-09-11 MED ORDER — FENTANYL CITRATE (PF) 100 MCG/2ML IJ SOLN
INTRAMUSCULAR | Status: AC
  Filled 2016-09-11: qty 2

## 2016-09-11 MED ORDER — SODIUM CHLORIDE 0.9% FLUSH: 3.0000 mL | Freq: Two times a day (BID) | INTRAVENOUS | Status: DC

## 2016-09-11 MED ORDER — VERAPAMIL HCL 2.5 MG/ML IV SOLN
INTRAVENOUS | Status: DC | PRN
  Administered 2016-09-11: 8 mL via INTRA_ARTERIAL

## 2016-09-11 SURGICAL SUPPLY — 13 items
CATH BALLN WEDGE 5F 110CM (CATHETERS) ×2 IMPLANT
CATH IMPULSE 5F ANG/FL3.5 (CATHETERS) ×2 IMPLANT
CATH LAUNCHER 5F RADR (CATHETERS) ×1 IMPLANT
CATHETER LAUNCHER 5F RADR (CATHETERS) ×2
DEVICE RAD COMP TR BAND LRG (VASCULAR PRODUCTS) ×2 IMPLANT
GLIDESHEATH SLEND SS 6F .021 (SHEATH) ×2 IMPLANT
GUIDEWIRE INQWIRE 1.5J.035X260 (WIRE) ×1 IMPLANT
INQWIRE 1.5J .035X260CM (WIRE) ×2
KIT HEART LEFT (KITS) ×2 IMPLANT
PACK CARDIAC CATHETERIZATION (CUSTOM PROCEDURE TRAY) ×2 IMPLANT
SHEATH GLIDE SLENDER 4/5FR (SHEATH) ×2 IMPLANT
TRANSDUCER W/STOPCOCK (MISCELLANEOUS) ×2 IMPLANT
TUBING CIL FLEX 10 FLL-RA (TUBING) ×2 IMPLANT

## 2016-09-11 NOTE — Discharge Instructions (Signed)

## 2016-09-11 NOTE — Interval H&P Note (Signed)
History and Physical Interval Note:  09/11/2016 2:54 PM  Robin Sutton  has presented today for surgery, with the diagnosis of aortic stenosis  The various methods of treatment have been discussed with the patient and family. After consideration of risks, benefits and other options for treatment, the patient has consented to  Procedure(s): RIGHT/LEFT HEART CATH AND CORONARY ANGIOGRAPHY (N/A) as a surgical intervention .  The patient's history has been reviewed, patient examined, no change in status, stable for surgery.  I have reviewed the patient's chart and labs.  Questions were answered to the patient's satisfaction.     Sherren Mocha

## 2016-09-11 NOTE — H&P (View-Only) (Signed)
Cardiology Office Note:    Date:  09/02/2016   ID:  Robin Sutton, DOB [birth year], MRN [id number]  PCP:  Maylon Cos, NP  Cardiologist:  Jenne Campus, MD    Referring MD: Maylon Cos, NP   Chief Complaint  Patient presents with  . 1 month follow up  She's here to tell about the aortic stenosis  History of Present Illness:    Robin Sutton is a 81 y.o. female  with significant aortic stenosis, latest echocardiogram showed mean gradient of 47. I met with her month ago we talked about options in this situation. She was unable to make a decision last time therefore I wanted to go home and think about it. Today she is coming to the decision. She would like to proceed with evaluation for aortic valve replacement. Told her that the first step is to cardiac catheterization which we will remeasure gradient across the aortic valve and more importantly check her coronary arteries. Based on that will know what her options will have. We talked about mechanical versus biological  Valve. We talked about cardiac catheterization, I expected all risks benefits as well as alternatives. We also talked about TAVR and hybrid procedure.  Past Medical History:  Diagnosis Date  . Hypertension     Past Surgical History:  Procedure Laterality Date  . BREAST BIOPSY    . TONSILLECTOMY      Current Medications: Current Meds  Medication Sig  . aspirin 325 MG tablet Take 325 mg by mouth daily.  Marland Kitchen lisinopril (PRINIVIL,ZESTRIL) 10 MG tablet Take 10 mg by mouth daily.  Marland Kitchen omeprazole (PRILOSEC) 20 MG capsule Take 20 mg by mouth daily.  . polyethylene glycol powder (GLYCOLAX/MIRALAX) powder Take 17 g by mouth as needed for constipation.  . pravastatin (PRAVACHOL) 40 MG tablet Take 40 mg by mouth daily.  . Vitamin D, Ergocalciferol, (DRISDOL) 50000 units CAPS capsule Take 50,000 Units by mouth once a week.     Allergies:   Patient has no known allergies.   Social History   Social History    . Marital status: Widowed    Spouse name: N/A  . Number of children: N/A  . Years of education: N/A   Social History Main Topics  . Smoking status: Never Smoker  . Smokeless tobacco: Never Used  . Alcohol use No  . Drug use: No  . Sexual activity: Not Asked   Other Topics Concern  . None   Social History Narrative  . None     Family History: The patient's family history includes Diabetes in her maternal grandfather; Heart disease in her brother, maternal grandfather, and mother; Hypertension in her brother and mother; Kidney failure in her father; Stroke in her mother. ROS:   Please see the history of present illness.    All 14 point review of systems negative except as described per history of present illness  EKGs/Labs/Other Studies Reviewed:      Recent Labs: No results found for requested labs within last 8760 hours.  Recent Lipid Panel No results found for: CHOL, TRIG, HDL, CHOLHDL, VLDL, LDLCALC, LDLDIRECT  Physical Exam:    VS:  BP 116/64   Pulse 84   Resp 14   Ht _0  (1.6 m)   Wt 186 lb (84.4 kg)   BMI 32.95 kg/m     Wt Readings from Last 3 Encounters:  09/02/16 186 lb (84.4 kg)  07/31/16 187 lb 12.8 oz (85.2 kg)  07/09/16 187 lb (84.8 kg)  GEN:  Well nourished, well developed in no acute distress HEENT: Normal NECK: No JVD; No carotid bruits LYMPHATICS: No lymphadenopathy CARDIAC: RRR, Systolic murmur grade 3/6 with radiation towards the neck, no rubs, no gallops RESPIRATORY:  Clear to auscultation without rales, wheezing or rhonchi  ABDOMEN: Soft, non-tender, non-distended MUSCULOSKELETAL:  No edema; No deformity  SKIN: Warm and dry LOWER EXTREMITIES: no swelling NEUROLOGIC:  Alert and oriented x 3 PSYCHIATRIC:  Normal affect   ASSESSMENT:    1. Nonrheumatic aortic valve stenosis   2. Essential hypertension   3. Dyslipidemia   4. Late effect of cerebrovascular accident (CVA)   5. Dyspnea on exertion    PLAN:    In order of  problems listed above:  1. Nonrheumatic aortic stenosis which appears to be severe. She agreed to have a cardiac catheterization to be evaluated for aortic valve replacement. 2. Essential hypertension: Blood pressure well-controlled continue present management. 3. Dyslipidemia: On pravastatin which I'll continue. 4. Late effects of CVA only minimal changes. 5. Dyspnea on exertion most likely related to aortic stenosis   Medication Adjustments/Labs and Tests Ordered: Current medicines are reviewed at length with the patient today.  Concerns regarding medicines are outlined above.  No orders of the defined types were placed in this encounter.  Medication changes: No orders of the defined types were placed in this encounter.   Signed, Park Liter, MD, [facility] 09/02/2016 1:54 PM    [place]

## 2016-09-12 ENCOUNTER — Encounter (HOSPITAL_COMMUNITY): Payer: Self-pay | Admitting: Cardiovascular Disease

## 2016-09-12 ENCOUNTER — Other Ambulatory Visit: Payer: Self-pay

## 2016-09-12 DIAGNOSIS — I350 Nonrheumatic aortic (valve) stenosis: Secondary | ICD-10-CM

## 2016-09-19 ENCOUNTER — Encounter (HOSPITAL_COMMUNITY): Payer: Self-pay

## 2016-09-19 ENCOUNTER — Ambulatory Visit (HOSPITAL_COMMUNITY)
Admission: RE | Admit: 2016-09-19 | Discharge: 2016-09-19 | Disposition: A | Discharge status: Home / Self Care | Payer: Medicare Other | Source: Ambulatory Visit | Attending: Cardiovascular Disease | Admitting: Cardiovascular Disease

## 2016-09-19 ENCOUNTER — Ambulatory Visit (HOSPITAL_COMMUNITY): Payer: Medicare Other

## 2016-09-19 ENCOUNTER — Ambulatory Visit (HOSPITAL_COMMUNITY): Admission: RE | Admit: 2016-09-19 | Payer: Medicare Other | Source: Ambulatory Visit

## 2016-09-19 ENCOUNTER — Encounter (HOSPITAL_COMMUNITY): Payer: Medicare Other

## 2016-09-19 DIAGNOSIS — Z952 Presence of prosthetic heart valve: Secondary | ICD-10-CM | POA: Diagnosis not present

## 2016-09-19 DIAGNOSIS — I700 Atherosclerosis of aorta: Secondary | ICD-10-CM | POA: Diagnosis not present

## 2016-09-19 DIAGNOSIS — I6523 Occlusion and stenosis of bilateral carotid arteries: Secondary | ICD-10-CM | POA: Diagnosis not present

## 2016-09-19 DIAGNOSIS — I350 Nonrheumatic aortic (valve) stenosis: Secondary | ICD-10-CM

## 2016-09-19 DIAGNOSIS — D259 Leiomyoma of uterus, unspecified: Secondary | ICD-10-CM | POA: Diagnosis not present

## 2016-09-19 LAB — VAS US CAROTID
LCCAPSYS: 109 cm/s
LEFT ECA DIAS: -12 cm/s
LEFT VERTEBRAL DIAS: 6 cm/s
LICAPSYS: -55 cm/s
Left CCA dist dias: 9 cm/s
Left CCA dist sys: 54 cm/s
Left CCA prox dias: 13 cm/s
Left ICA dist dias: -12 cm/s
Left ICA dist sys: -50 cm/s
Left ICA prox dias: -17 cm/s
RCCADSYS: -60 cm/s
RIGHT CCA MID DIAS: -17 cm/s
RIGHT ECA DIAS: -12 cm/s
RIGHT VERTEBRAL DIAS: 8 cm/s
Right CCA prox dias: -15 cm/s
Right CCA prox sys: -83 cm/s

## 2016-09-19 MED ORDER — IOPAMIDOL (ISOVUE-370) INJECTION 76%
INTRAVENOUS | Status: AC
  Administered 2016-09-19: 100 mL
  Filled 2016-09-19: qty 100

## 2016-09-19 NOTE — Progress Notes (Signed)
VASCULAR LAB PRELIMINARY  PRELIMINARY  PRELIMINARY  PRELIMINARY  Carotid duplex completed.    Preliminary report:  Bilateral:  1-39% ICA stenosis.  Vertebral artery flow is antegrade.     Hairston,Robert, RVT, RDCS 09/19/2016, 4:56 PM

## 2016-09-20 ENCOUNTER — Encounter (HOSPITAL_COMMUNITY): Payer: Medicare Other

## 2016-09-24 ENCOUNTER — Encounter (HOSPITAL_COMMUNITY): Payer: Medicare Other

## 2016-09-24 ENCOUNTER — Other Ambulatory Visit (HOSPITAL_COMMUNITY): Payer: Medicare Other

## 2016-09-25 ENCOUNTER — Institutional Professional Consult (permissible substitution) (INDEPENDENT_AMBULATORY_CARE_PROVIDER_SITE_OTHER): Payer: Medicare Other | Admitting: Thoracic Surgery (Cardiothoracic Vascular Surgery)

## 2016-09-25 ENCOUNTER — Encounter: Payer: Self-pay | Admitting: Physical Therapy

## 2016-09-25 ENCOUNTER — Ambulatory Visit (HOSPITAL_COMMUNITY)
Admission: RE | Admit: 2016-09-25 | Discharge: 2016-09-25 | Disposition: A | Discharge status: Home / Self Care | Payer: Medicare Other | Source: Ambulatory Visit | Attending: Cardiovascular Disease | Admitting: Cardiovascular Disease

## 2016-09-25 ENCOUNTER — Ambulatory Visit: Payer: Medicare Other | Attending: Nurse Practitioner | Admitting: Physical Therapy

## 2016-09-25 ENCOUNTER — Encounter: Payer: Self-pay | Admitting: Thoracic Surgery (Cardiothoracic Vascular Surgery)

## 2016-09-25 VITALS — BP 130/71 | HR 79 | Resp 20 | Ht 63.0 in | Wt 183.0 lb

## 2016-09-25 DIAGNOSIS — R2689 Other abnormalities of gait and mobility: Secondary | ICD-10-CM | POA: Insufficient documentation

## 2016-09-25 DIAGNOSIS — I350 Nonrheumatic aortic (valve) stenosis: Secondary | ICD-10-CM | POA: Diagnosis not present

## 2016-09-25 LAB — PULMONARY FUNCTION TEST
DL/VA % PRED: 91 %
DL/VA: 4.29 ml/min/mmHg/L
DLCO unc % pred: 69 %
DLCO unc: 15.95 ml/min/mmHg
FEF 25-75 POST: 1.92 L/s
FEF 25-75 Pre: 1.59 L/sec
FEF2575-%Change-Post: 21 %
FEF2575-%PRED-POST: 136 %
FEF2575-%Pred-Pre: 112 %
FEV1-%CHANGE-POST: 3 %
FEV1-%PRED-PRE: 101 %
FEV1-%Pred-Post: 105 %
FEV1-PRE: 1.92 L
FEV1-Post: 1.99 L
FEV1FVC-%Change-Post: 7 %
FEV1FVC-%PRED-PRE: 102 %
FEV6-%Change-Post: -2 %
FEV6-%Pred-Post: 101 %
FEV6-%Pred-Pre: 104 %
FEV6-POST: 2.44 L
FEV6-PRE: 2.5 L
FEV6FVC-%Change-Post: 0 %
FEV6FVC-%PRED-POST: 106 %
FEV6FVC-%Pred-Pre: 105 %
FVC-%Change-Post: -3 %
FVC-%PRED-POST: 95 %
FVC-%PRED-PRE: 99 %
FVC-POST: 2.44 L
FVC-PRE: 2.53 L
PRE FEV6/FVC RATIO: 99 %
Post FEV1/FVC ratio: 82 %
Post FEV6/FVC ratio: 100 %
Pre FEV1/FVC ratio: 76 %
RV % pred: 92 %
RV: 2.13 L
TLC % PRED: 93 %
TLC: 4.57 L

## 2016-09-25 MED ORDER — ALBUTEROL SULFATE (2.5 MG/3ML) 0.083% IN NEBU
2.5000 mg | INHALATION_SOLUTION | Freq: Once | RESPIRATORY_TRACT | Status: AC
  Administered 2016-09-25: 2.5 mg via RESPIRATORY_TRACT

## 2016-09-25 NOTE — Therapy (Signed)
[place], [place], [address] Phone: [phone]   Fax:  [phone]  Physical Therapy Evaluation  Patient Details  Name: Robin Sutton MRN: [id number] Date of Birth: [birth year] Referring Provider: Dr. Sherren Mocha  Encounter Date: 09/25/2016      PT End of Session - 09/25/16 1508    Visit Number 1   PT Start Time 1426   PT Stop Time 2706   PT Time Calculation (min) 31 min      Past Medical History:  Diagnosis Date  . Hypertension   . Stroke [facility])     Past Surgical History:  Procedure Laterality Date  . BREAST BIOPSY    . RIGHT/LEFT HEART CATH AND CORONARY ANGIOGRAPHY N/A 09/11/2016   Procedure: RIGHT/LEFT HEART CATH AND CORONARY ANGIOGRAPHY;  Surgeon: Sherren Mocha, MD;  Location: [facility];  Service: Cardiovascular;  Laterality: N/A;  . TONSILLECTOMY      There were no vitals filed for this visit.       Subjective Assessment - 09/25/16 1428    Subjective Pt reports her daughter has noticed increased shortness of breath from the patient over the past 2-3 months. Pt does report noticing more fatigue and difficulty working in the yard and is unable to walk the distance to the mailbox (~2400 ft round trip)   Patient Stated Goals be able to walk to the mailbox and return to yardwork   Currently in Pain? No/denies            [facility] PT Assessment - 09/25/16 0001      Assessment   Medical Diagnosis severe aortic stenosis   Referring Provider Dr. Sherren Mocha   Onset Date/Surgical Date --  last 2-3 months     Precautions   Precautions None     Restrictions   Weight Bearing Restrictions No     Balance Screen   Has the patient fallen in the past 6 months Yes   How many times? 1   Has the patient had a decrease in activity level because of a fear of falling?  No   Is the patient reluctant to leave their home because of a fear of falling?  No     Home Environment   Living Environment Private residence   Living Arrangements Alone   Type of [place] entrance   [place] Two level;Laundry or work area in basement     Prior Function   Level of Independence Independent with community mobility without device     Posture/Postural Control   Posture/Postural Control Postural limitations   Postural Limitations Rounded Shoulders;Forward head  mild     ROM / Strength   AROM / PROM / Strength AROM;Strength     AROM   Overall AROM Comments grossly WNL     Strength   Overall Strength Comments grossly 5/5 except R side mild weakness from past CVA   Strength Assessment Site Hand   Right/Left hand Right;Left   Right Hand Grip (lbs) 47  R hand dominant   Left Hand Grip (lbs) 40     Ambulation/Gait   Gait Comments Pt wearing flats/sandals and not tennis shoes for eval. Pt demonstrates mild lateral trunk sway with gait. Gait distance is limited by 30% for age/gender and pt reports mild SOB with 6 minute walk.           [facility] Pre-Surgical Assessment - 09/25/16 0001    5 Meter Walk  Test- trial 1 6 sec   5 Meter Walk Test- trial 2 7 sec.    5 Meter Walk Test- trial 3 5 sec.   5 meter walk test average 6 sec   4 Stage Balance Test tolerated for:  6 sec.   4 Stage Balance Test Position 4   Sit To Stand Test- trial 1 14 sec.   ADL/IADL Independent with: Bathing;Meal prep;Dressing;Finances   ADL/IADL Needs Assistance with: Yard work   ADL/IADL Fraility Index Vulnerable   6 Minute Walk- Baseline yes   BP (mmHg) 118/70   HR (bpm) 76   02 Sat (%RA) 95 %   Modified Borg Scale for Dyspnea 0- Nothing at all   Perceived Rate of Exertion (Borg) 6-   6 Minute Walk Post Test yes   BP (mmHg) 139/76   HR (bpm) 124   02 Sat (%RA) 95 %   Modified Borg Scale for Dyspnea 2- Mild shortness of breath   Perceived Rate of Exertion (Borg) 12-   Aerobic Endurance Distance Walked 1080          Objective measurements completed on  examination: See above findings.                               Plan - 09/30/16 1508    Clinical Impression Statement see below   PT Frequency One time visit     Clinical Impression Statement: Pt is a 81 yo female presenting to OP PT for evaluation prior to possible TAVR surgery due to severe aortic stenosis. Pt reports onset of shortness of breath with activity approximately 2-3 months ago. Symptoms are limiting her ability to walk to her mailbox and do yardowrk. Pt presents with good ROM and strength, good balance and is not at high fall risk 4 stage balance test, ood walking speed and fair aerobic endurance per 6 minute walk test. Pt ambulated a total of 1080 feet in 6 minute walk. Based on the Short Physical Performance Battery, patient has a frailty rating of 10/12 with </= 5/12 considered frail.   Patient demonstrated the following deficits and impairments:     Visit Diagnosis: Other abnormalities of gait and mobility      G-Codes - 2016/09/30 1508    Functional Assessment Tool Used (Outpatient Only) 6 minute walk 1080'   Functional Limitation Mobility: Walking and moving around   Mobility: Walking and Moving Around Current Status [phone]) At least 20 percent but less than 40 percent impaired, limited or restricted   Mobility: Walking and Moving Around Goal Status [phone]) At least 20 percent but less than 40 percent impaired, limited or restricted   Mobility: Walking and Moving Around Discharge Status [phone]) At least 20 percent but less than 40 percent impaired, limited or restricted       Problem List Patient Active Problem List   Diagnosis Date Noted  . Severe aortic stenosis 07/09/2016  . Dyspnea on exertion 07/09/2016  . Dyslipidemia 07/09/2016  . Essential hypertension 07/09/2016  . Late effect of cerebrovascular accident (CVA) 07/09/2016    Romualdo Bolk, PT September 30, 2016, 3:09 PM  [facility] [address] [place], [place], [address] Phone: [phone]   Fax:  [phone]  Name: Robin Sutton MRN: [id number] Date of Birth: 12-Nov-1935

## 2016-09-25 NOTE — Progress Notes (Addendum)
[facility] CONSULTATION REPORT  Referring Provider is Sherren Mocha, MD  Primary Cardiologist is Park Liter, MD PCP is Maylon Cos, NP  Chief Complaint  Patient presents with  . Aortic Stenosis    Surgical eval for TAVR, review all studies    HPI:  Patient is a 81 year old female with history of aortic stenosis, hypertension, previous stroke, and chronic diastolic congestive heart failure referred for surgical consultation to discuss treatment options for management of severe symptomatic aortic stenosis. The patient states that she has known about the presence of a heart murmur for several years. 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 She still has very mild right-sided weakness but overall recovered uneventfully. She has remained entirely functionally independent although she admits that she lives a somewhat sedentary lifestyle. Over the past several months she has developed symptoms of exertional shortness breath and fatigue. She was seen in follow-up by her primary care physician and noted to have a prominent murmur on physical exam. She was referred to Dr. Agustin Cree and underwent transthoracic echocardiogram that revealed severe aortic stenosis with preserved left ventricular systolic function. She subsequently underwent left and right heart catheterization by Dr. Burt Knack confirming the presence of at least moderate aortic stenosis with mild nonobstructive coronary artery disease. CT angiography was performed and cardiothoracic surgical consultation was requested.  The patient has been widowed for 4 years having lost her husband to Parkinson's disease. She lives alone [place], relatively close to [place], [place].  She has been retired from the [employer] for more than 15 years. She has remained functionally independent  and without significant limitations, although she admits to some mild residual right-sided weakness related to the stroke she suffered 9 years ago.  Over the past 3-6 months she has developed exertional shortness of breath and fatigue. She denies any resting shortness of breath but she does get more short of breath lying flat in bed.  She has never had any chest pain or chest tightness. She denies PND, dizzy spells, or syncope. She has had mild lower extremity edema. She admits to living a fairly sedentary lifestyle. She denies any significant balance issues and she does not use a cane or walker for ambulation, although she admits that she stumbled and had one fall within the past year.  Past Medical History:  Diagnosis Date  . Hypertension   . Stroke [facility])     Past Surgical History:  Procedure Laterality Date  . BREAST BIOPSY    . RIGHT/LEFT HEART CATH AND CORONARY ANGIOGRAPHY N/A 09/11/2016   Procedure: RIGHT/LEFT HEART CATH AND CORONARY ANGIOGRAPHY;  Surgeon: Sherren Mocha, MD;  Location: [place] [facility];  Service: Cardiovascular;  Laterality: N/A;  . TONSILLECTOMY      Family History  Problem Relation Age of Onset  . Hypertension Mother   . Heart disease Mother   . Stroke Mother   . Kidney failure Father   . Hypertension Brother   . Heart disease Brother   . Heart disease Maternal Grandfather   . Diabetes Maternal Grandfather     Social History   Social History  . Marital status: Widowed    Spouse name: N/A  . Number of children: N/A  . Years of education: N/A   Occupational History  . Not on file.   Social History Main Topics  . Smoking status: Never Smoker  . Smokeless  tobacco: Never Used  . Alcohol use No  . Drug use: No  . Sexual activity: Not on file   Other Topics Concern  . Not on file   Social History Narrative  . No narrative on file    Current Outpatient Prescriptions  Medication Sig Dispense Refill  . aspirin 325 MG tablet Take 325 mg by mouth  daily.    Marland Kitchen lisinopril (PRINIVIL,ZESTRIL) 10 MG tablet Take 10 mg by mouth every evening.   1  . polyethylene glycol powder (GLYCOLAX/MIRALAX) powder Take 17 g by mouth as needed for constipation.    . pravastatin (PRAVACHOL) 40 MG tablet Take 40 mg by mouth every evening.   1  . Vitamin D, Ergocalciferol, (DRISDOL) 50000 units CAPS capsule Take 50,000 Units by mouth every Monday.   1   No current facility-administered medications for this visit.     No Known Allergies    Review of Systems:   General:  normal appetite, decreased energy, no weight gain, no weight loss, no fever  Cardiac:  no chest pain with exertion, no chest pain at rest, +SOB with exertion, no resting SOB, no PND, + orthopnea, no palpitations, no arrhythmia, no atrial fibrillation, + LE edema, no dizzy spells, no syncope  Respiratory:  + exertional shortness of breath, no home oxygen, no productive cough, no dry cough, no bronchitis, no wheezing, no hemoptysis, no asthma, no pain with inspiration or cough, no sleep apnea, no CPAP at night  GI:   no difficulty swallowing, no reflux, no frequent heartburn, no hiatal hernia, no abdominal pain, occasional constipation, no diarrhea, no hematochezia, no hematemesis, no melena  GU:   no dysuria,  no frequency, no urinary tract infection, no hematuria, no kidney stones, no kidney disease  Vascular:  no pain suggestive of claudication, no pain in feet, no leg cramps, + varicose veins, no DVT, no non-healing foot ulcer  Neuro:   + stroke, no TIA's, no seizures, no headaches, no temporary blindness one eye,  no slurred speech, no peripheral neuropathy, no chronic pain, very mild instability of gait, no memory/cognitive dysfunction  Musculoskeletal: mild arthritis, no joint swelling, no myalgias, no difficulty walking, normal mobility   Skin:   no rash, no itching, no skin infections, no pressure sores or ulcerations  Psych:   no anxiety, no depression, no nervousness, no unusual recent  stress  Eyes:   no blurry vision, no floaters, no recent vision changes, + wears glasses or contacts  ENT:   no hearing loss, no loose or painful teeth, upper dentures, last saw dentist approx 1 year ago  Hematologic:  no easy bruising, no abnormal bleeding, no clotting disorder, no frequent epistaxis  Endocrine:  no diabetes, does not check CBG's at home           Physical Exam:   BP 130/71   Pulse 79   Resp 20   Ht _0  (1.6 m)   Wt 183 lb (83 kg)   SpO2 97% Comment: RA  BMI 32.42 kg/m   General:  Mildly obese,  well-appearing  HEENT:  Unremarkable   Neck:   no JVD, no bruits, no adenopathy   Chest:   clear to auscultation, symmetrical breath sounds, no wheezes, no rhonchi   CV:   RRR, grade III/VI crescendo/decrescendo murmur heard best at sternal border,  no diastolic murmur  Abdomen:  soft, non-tender, no masses   Extremities:  warm, well-perfused, pulses palpable, trace bilateral LE edema  Rectal/GU  Deferred  Neuro:   Grossly non-focal and symmetrical throughout  Skin:   Clean and dry, no rashes, no breakdown   Diagnostic Tests:  Transthoracic Echocardiography  Patient:    Robin Sutton, Robin Sutton MR #:       [id number] Study Date: 07/18/2016 Gender:     F Age:        75 Height:     160 cm Weight:     84.8 kg BSA:        1.98 m^2 Pt. Status: Room:   ATTENDING    Sherryl Barters     Park Liter  REFERRING    Kras[facility], [place], Inpatient  cc:  ------------------------------------------------------------------- LV EF: 65% -   70%  ------------------------------------------------------------------- Indications:      Aortic stenosis 424.1.  ------------------------------------------------------------------- History:   PMH:  Increased shortness of breath.  Risk factors: Hypertension. Dyslipidemia.  ------------------------------------------------------------------- Study  Conclusions  - Left ventricle: The cavity size was normal. Systolic function was   vigorous. The estimated ejection fraction was in the range of 65%   to 70%. Doppler parameters are consistent with abnormal left   ventricular relaxation (grade 1 diastolic dysfunction). - Aortic valve: AV is thickened, calcified with restricted motion.   Peak and mean gradients through the valve are 81 and 47 mm Hg   respectively consistent with sevrere AS. There was moderate   regurgitation.  ------------------------------------------------------------------- Study data:  No prior study was available for comparison.  Study status:  Routine.  Procedure:  The patient reported no pain pre or post test. Transthoracic echocardiography. Image quality was adequate.  Study completion:  There were no complications. Transthoracic echocardiography.  M-mode, complete 2D, spectral Doppler, and color Doppler.  Birthdate:  Patient birthdate: 1935/07/25.  Age:  Patient is 81 yr old.  Sex:  Gender: female. BMI: 33.1 kg/m^2.  Blood pressure:     120/64  Patient status: Outpatient.  Study date:  Study date: 07/18/2016. Study time: 01:18 PM.  Location:  Echo laboratory.  -------------------------------------------------------------------  ------------------------------------------------------------------- Left ventricle:  The cavity size was normal. Systolic function was vigorous. The estimated ejection fraction was in the range of 65% to 70%. Doppler parameters are consistent with abnormal left ventricular relaxation (grade 1 diastolic dysfunction).  ------------------------------------------------------------------- Aortic valve:  AV is thickened, calcified with restricted motion. Peak and mean gradients through the valve are 81 and 47 mm Hg respectively consistent with sevrere AS.  Doppler:  There was moderate regurgitation.    VTI ratio of LVOT to aortic valve: 0.37. Valve area (VTI): 0.94 cm^2. Indexed  valve area (VTI): 0.48 cm^2/m^2. Peak velocity ratio of LVOT to aortic valve: 0.41. Valve area (Vmax): 1.05 cm^2. Indexed valve area (Vmax): 0.53 cm^2/m^2. Mean velocity ratio of LVOT to aortic valve: 0.35. Valve area (Vmean): 0.9 cm^2. Indexed valve area (Vmean): 0.45 cm^2/m^2. Mean gradient (S): 43 mm Hg. Peak gradient (S): 76 mm Hg.  ------------------------------------------------------------------- Mitral valve:   Mildly thickened leaflets .  Doppler:  There was trivial regurgitation.    Peak gradient (D): 4 mm Hg.  ------------------------------------------------------------------- Left atrium:  The atrium was normal in size.  ------------------------------------------------------------------- Right ventricle:  The cavity size was normal. Wall thickness was normal. Systolic function was normal.  ------------------------------------------------------------------- Pulmonic valve:    Structurally normal valve.   Cusp separation was normal.  Doppler:  Transvalvular velocity was within the normal range. There was trivial regurgitation.  ------------------------------------------------------------------- Tricuspid valve:   Structurally normal valve.  Leaflet separation was normal.  Doppler:  Transvalvular velocity was within the normal range. There was no regurgitation.  ------------------------------------------------------------------- Right atrium:  The atrium was normal in size.  ------------------------------------------------------------------- Measurements   Left ventricle                           Value          Reference  LV ID, ED, PLAX chordal          (L)     40.4  mm       43 - 52  LV ID, ES, PLAX chordal                  26.5  mm       23 - 38  LV fx shortening, PLAX chordal           34    %        >=29  LV PW thickness, ED                      11.5  mm       ----------  IVS/LV PW ratio, ED                      0.91           <=1.3  Stroke volume, 2D                         94    ml       ----------  Stroke volume/bsa, 2D                    48    ml/m^2   ----------  LV e&', lateral                           6.74  cm/s     ----------  LV E/e&', lateral                         14.11          ----------  LV e&', medial                            6.64  cm/s     ----------  LV E/e&', medial                          14.32          ----------  LV e&', average                           6.69  cm/s     ----------  LV E/e&', average                         14.22          ----------    Ventricular septum                       Value          Reference  IVS thickness, ED  10.5  mm       ----------    LVOT                                     Value          Reference  LVOT ID, S                               18    mm       ----------  LVOT area                                2.54  cm^2     ----------  LVOT peak velocity, S                    180   cm/s     ----------  LVOT mean velocity, S                    109   cm/s     ----------  LVOT VTI, S                              37    cm       ----------  LVOT peak gradient, S                    13    mm Hg    ----------    Aortic valve                             Value          Reference  Aortic valve peak velocity, S            435   cm/s     ----------  Aortic valve mean velocity, S            309   cm/s     ----------  Aortic valve VTI, S                      99.7  cm       ----------  Aortic mean gradient, S                  43    mm Hg    ----------  Aortic peak gradient, S                  76    mm Hg    ----------  VTI ratio, LVOT/AV                       0.37           ----------  Aortic valve area, VTI                   0.94  cm^2     ----------  Aortic valve area/bsa, VTI               0.48  cm^2/m^2 ----------  Velocity ratio, peak, LVOT/AV            0.41           ----------  Aortic valve area, peak velocity         1.05  cm^2     ----------  Aortic valve area/bsa,  peak              0.53  cm^2/m^2 ----------  velocity  Velocity ratio, mean, LVOT/AV            0.35           ----------  Aortic valve area, mean velocity         0.9   cm^2     ----------  Aortic valve area/bsa, mean              0.45  cm^2/m^2 ----------  velocity  Aortic regurg pressure half-time         493   ms       ----------    Aorta                                    Value          Reference  Aortic root ID, ED                       29    mm       ----------    Left atrium                              Value          Reference  LA ID, A-P, ES                           44    mm       ----------  LA ID/bsa, A-P                   (H)     2.23  cm/m^2   <=2.2  LA volume, S                             84.1  ml       ----------  LA volume/bsa, S                         42.6  ml/m^2   ----------  LA volume, ES, 1-p A4C                   79.5  ml       ----------  LA volume/bsa, ES, 1-p A4C               40.2  ml/m^2   ----------  LA volume, ES, 1-p A2C                   81.2  ml       ----------  LA volume/bsa, ES, 1-p A2C               41.1  ml/m^2   ----------    Mitral valve                             Value          Reference  Mitral E-wave peak velocity  95.1  cm/s     ----------  Mitral A-wave peak velocity              123   cm/s     ----------  Mitral deceleration time         (H)     320   ms       150 - 230  Mitral peak gradient, D                  4     mm Hg    ----------  Mitral E/A ratio, peak                   0.8            ----------    Pulmonary arteries                       Value          Reference  PA pressure, S, DP                       27    mm Hg    <=30    Tricuspid valve                          Value          Reference  Tricuspid regurg peak velocity           246   cm/s     ----------  Tricuspid peak RV-RA gradient            24    mm Hg    ----------    Right atrium                             Value          Reference  RA ID, S-I, ES,  A4C              (H)     50.9  mm       34 - 49  RA area, ES, A4C                 (H)     20.8  cm^2     8.3 - 19.5  RA volume, ES, A/L                       64.6  ml       ----------  RA volume/bsa, ES, A/L                   32.7  ml/m^2   ----------    Systemic veins                           Value          Reference  Estimated CVP                            3     mm Hg    ----------    Right ventricle                          Value  Reference  RV ID, minor axis, ED, A4C base          38.4  mm       ----------  TAPSE                                    29.1  mm       ----------  RV pressure, S, DP                       27    mm Hg    <=30  RV s&', lateral, S                        15.4  cm/s     ----------  Legend: (L)  and  (H)  mark values outside specified reference range.  ------------------------------------------------------------------- Prepared and Electronically Authenticated by  Dorris Carnes, M.D. 2018-07-12T14:36:38    RIGHT/LEFT HEART CATH AND CORONARY ANGIOGRAPHY  Conclusion   1. Mild-moderate diffuse nonobstructive CAD (left dominant) 2. Moderate aortic stenosis by cath data with a mean transvalvular gradient of 25 mmHg and AVA 1.3 square cm 3. Normal right heart hemodynamics  Plan: echo data reviewed and is diagnostic of severe aortic stenosis with a peak transaortic velocity of 4.5 m/s and mean gradient 47 mmHg. Will refer to cardiac surgery for multidisciplinary valve team evaluation.   Indications   Severe aortic stenosis [I35.0 (ICD-10-CM)]  Procedural Details/Technique   Technical Details INDICATION: Severe symptomatic aortic stenosis  PROCEDURAL DETAILS: There was an indwelling IV in a right antecubital vein. Using normal sterile technique, the IV was changed out for a 5 Fr brachial sheath over a 0.018 inch wire. The right wrist was then prepped, draped, and anesthetized with 1% lidocaine. Using the modified Seldinger technique a 5/6 French  Slender sheath was placed in the right radial artery. Intra-arterial verapamil was administered through the radial artery sheath. IV heparin was administered after a JR4 catheter was advanced into the central aorta. A Swan-Ganz catheter was used for the right heart catheterization. Standard protocol was followed for recording of right heart pressures and sampling of oxygen saturations. Fick cardiac output was calculated. Standard Judkins catheters were used for selective coronary angiography. The right subclavian was very tortuous and catheter manipulation was difficult. The RCA was selectively engaged with a 5 Fr Easy-Rad guide catheter. The LCA was engaged with a JR 3.5 diagnostic catheter. The aortic valve was crossed with a JR4 catheter directing a J-wire into the LV. Pullback pressure was recorded. There were no immediate procedural complications. The patient was transferred to the post catheterization recovery area for further monitoring.     Estimated blood loss <50 mL.  During this procedure the patient was administered the following to achieve and maintain moderate conscious sedation: Versed 1 mg, Fentanyl 25 mcg, while the patient's heart rate, blood pressure, and oxygen saturation were continuously monitored. The period of conscious sedation was 39 minutes, of which I was present face-to-face 100% of this time.    Coronary Findings   Dominance: Left  Left Anterior Descending  Prox LAD lesion, 40% stenosed. The lesion is eccentric. The lesion is calcified. Nonobstructive proximal LAD lesion.  Left Circumflex  Mid Cx lesion, 30% stenosed.  First Obtuse Marginal Branch  1st Mrg lesion, 40% stenosed. The lesion is calcified.  Right Coronary Artery  Vessel is small.  Left  Heart   Aortic Valve There is moderate aortic valve stenosis. The aortic valve is calcified. There is restricted aortic valve motion. Mean gradient 25 mmHg, AVA 1.3 square cm    Coronary Diagrams   Diagnostic Diagram        Implants     No implant documentation for this case.  PACS Images   Show images for CARDIAC CATHETERIZATION   Link to Procedure Log   Procedure Log    Hemo Data    Most Recent Value  Fick Cardiac Output 5.54 L/min  Fick Cardiac Output Index 2.96 (L/min)/BSA  Aortic Mean Gradient 25 mmHg  Aortic Peak Gradient 27 mmHg  Aortic Valve Area 1.30  Aortic Value Area Index 0.7 cm2/BSA  RA A Wave 12 mmHg  RA V Wave 9 mmHg  RA Mean 8 mmHg  RV Systolic Pressure 32 mmHg  RV Diastolic Pressure 5 mmHg  RV EDP 12 mmHg  PA Systolic Pressure 28 mmHg  PA Diastolic Pressure 10 mmHg  PA Mean 19 mmHg  PW A Wave 18 mmHg  PW V Wave 15 mmHg  PW Mean 14 mmHg  AO Systolic Pressure 875 mmHg  AO Diastolic Pressure 59 mmHg  AO Mean 84 mmHg  LV Systolic Pressure 643 mmHg  LV Diastolic Pressure 9 mmHg  LV EDP 17 mmHg  Arterial Occlusion Pressure Extended Systolic Pressure 329 mmHg  Arterial Occlusion Pressure Extended Diastolic Pressure 60 mmHg  Arterial Occlusion Pressure Extended Mean Pressure 87 mmHg  Left Ventricular Apex Extended Systolic Pressure 518 mmHg  Left Ventricular Apex Extended Diastolic Pressure 8 mmHg  Left Ventricular Apex Extended EDP Pressure 19 mmHg  QP/QS 1  TPVR Index 6.41 HRUI  TSVR Index 28.35 HRUI  PVR SVR Ratio 0.07  TPVR/TSVR Ratio 0.23     Cardiac TAVR CT  TECHNIQUE: The patient was scanned on a Siemens 841 slice Force scanner. A 100 kV retrospective scan was triggered in the ascending thoracic aorta at 140 HU's. Gantry rotation speed was 250 msecs and collimation was .6 mm. No beta blockade or nitro were given. The 3D data set was reconstructed in 5% intervals of the R-R cycle. Systolic and diastolic phases were analyzed on a dedicated work station using MPR, MIP and VRT modes. The patient received 80 cc of contrast.  FINDINGS: Aortic Valve: Tri leaflet, thickened and calcified with restricted motion  Aorta: Moderate mixed plaque in the  aortic arch and descending thoracic aorta. Some calcium in the intervalvular fibrosa and MAC  Sinotubular Junction:  25 mm  Ascending Thoracic Aorta:  36 mm  Aortic Arch:  27 mm  Descending Thoracic Aorta:  23 mm  Sinus of Valsalva Measurements:  Non-coronary:  28.4 mm  Right -coronary:  27.5 mm  Left -coronary:  27.4 mm  Coronary Artery Height above Annulus:  Left Main:  10.8 mm above annulus  Right Coronary:  11.9 mm above annulus  Virtual Basal Annulus Measurements:  Maximum/Minimum Diameter:  21.4 mm x 16.8 mm  Perimeter:  62 mm  Area:  308 mm2  Coronary Arteries:  Sufficient height above annulus for deployment  Optimum Fluoroscopic Angle for Delivery: LAO 33 degrees Cranial 5 degrees  IMPRESSION: 1) Calcified tri leaflet aortic valve with annular area of 308 mm2 suitable for a 20 mm Sapien 3 valve and perimeter of 62 mm suitable for a 23 mm Evolut Pro valve  2) Optimum angiographic angle for deployment LAO 33 degrees Cranial 5 degrees  3) No LAA thrombus  4) Aortic root  3.6 cm  5) Coronary arteries sufficient height above annulus for deployment  Jenkins Rouge   Electronically Signed   By: Jenkins Rouge M.D.   On: 09/19/2016 17:38    CT ANGIOGRAPHY CHEST, ABDOMEN AND PELVIS  TECHNIQUE: Multidetector CT imaging through the chest, abdomen and pelvis was performed using the standard protocol during bolus administration of intravenous contrast. Multiplanar reconstructed images and MIPs were obtained and reviewed to evaluate the vascular anatomy.  CONTRAST:  100 mL of Isovue 370.  COMPARISON:  No priors.  FINDINGS: CTA CHEST FINDINGS  Cardiovascular: Heart size is mildly enlarged. There is no significant pericardial fluid, thickening or pericardial calcification. There is aortic atherosclerosis, as well as atherosclerosis of the great vessels of the mediastinum and the coronary arteries, including calcified  atherosclerotic plaque in the left main, left anterior descending and left circumflex coronary arteries. Severe thickening calcification of the aortic valve. Calcification of the mitral annulus.  Mediastinum/Lymph Nodes: No pathologically enlarged mediastinal or hilar lymph nodes. Please note that accurate exclusion of hilar adenopathy is limited on noncontrast CT scans. Small hiatal hernia. No axillary lymphadenopathy.  Lungs/Pleura: 3 x 9 mm (mean diameter of 6 mm) subpleural nodule in the posterior aspect of the right lower lobe (axial image 68 of series 14). No other larger more suspicious appearing pulmonary nodules or masses. No acute consolidative airspace disease. No pleural effusions.  Musculoskeletal/Soft Tissues: There are no aggressive appearing lytic or blastic lesions noted in the visualized portions of the skeleton.  CTA ABDOMEN AND PELVIS FINDINGS  Hepatobiliary: Diffuse low attenuation throughout the hepatic parenchyma, strongly suggestive of hepatic steatosis (difficult to say for certain on today's contrast enhanced examination). No definite cystic or solid hepatic lesions are identified. No intra or extrahepatic biliary ductal dilatation. Gallbladder is unremarkable in appearance.  Pancreas: No pancreatic mass. No pancreatic ductal dilatation. No pancreatic or peripancreatic fluid or inflammatory changes.  Spleen: Unremarkable.  Adrenals/Urinary Tract: Bilateral kidneys and bilateral adrenal glands are normal in appearance. No hydroureteronephrosis. Urinary bladder is normal in appearance.  Stomach/Bowel: Normal appearance of the stomach. No pathologic dilatation of small bowel or colon. A few scattered colonic diverticulae are noted, without surrounding inflammatory changes to suggest an acute diverticulitis at this time. Normal appendix.  Vascular/Lymphatic: Aortic atherosclerosis, with vascular findings and measurements pertinent to  potential TAVR procedure, as detailed below. No aneurysm or dissection noted in the abdominal or pelvic vasculature. The celiac axis, superior mesenteric artery and inferior mesenteric artery are all patent without hemodynamically significant stenosis. Renal arteries are patent bilaterally (2 on the right and 1 on the left) without definite hemodynamically significant stenoses. No lymphadenopathy noted in the abdomen or pelvis.  Reproductive: 4.5 x 4.9 x 4.3 cm heterogeneously enhancing lesion in the right side of the uterine body, incompletely characterized but likely to represent a a leiomyoma. Ovaries are atrophic.  Other: No significant volume of ascites.  No pneumoperitoneum.  Musculoskeletal: There are no aggressive appearing lytic or blastic lesions noted in the visualized portions of the skeleton.  VASCULAR MEASUREMENTS PERTINENT TO TAVR:  AORTA:  Minimal Aortic Diameter -  14 x 14 mm  Severity of Aortic Calcification -  mild  RIGHT PELVIS:  Right Common Iliac Artery -  Minimal Diameter - 100. x 10.4 mm  Tortuosity - mild  Calcification - mild  Right External Iliac Artery -  Minimal Diameter - 8.3 x 7.7 mm  Tortuosity - severe  Calcification - mild  Right Common Femoral Artery -  Minimal Diameter -  7.7 x 8.7 mm  Tortuosity - mild  Calcification - mild  LEFT PELVIS:  Left Common Iliac Artery -  Minimal Diameter - 9.9 x 9.2 mm  Tortuosity - mild  Calcification - mild  Left External Iliac Artery -  Minimal Diameter - 8.2 x 7.9 mm  Tortuosity - severe  Calcification - minimal  Left Common Femoral Artery -  Minimal Diameter - 8.5 x 8.7 mm  Tortuosity - mild  Calcification - none  Review of the MIP images confirms the above findings.  IMPRESSION: 1. Vascular findings and measurements pertinent to potential TAVR procedure, as detailed above. This patient does have suitable pelvic arterial access  bilaterally. 2. Severe thickening calcification of the aortic valve, compatible with the reported clinical history of severe aortic stenosis. 3. Aortic atherosclerosis, in addition to left main and 2 vessel coronary artery disease. Assessment for potential risk factor modification, dietary therapy or pharmacologic therapy may be warranted, if clinically indicated. 4. Probable fibroid in the uterus, as discussed above. 5. Additional incidental findings, as above. Aortic Atherosclerosis (ICD10-I70.0).   Electronically Signed   By: Vinnie Langton M.D.   On: 09/20/2016 08:21     STS Risk Calculator Procedure: AV Replacement  Risk of Mortality: 2.283%  Morbidity or Mortality: 16.689%  Long Length of Stay: 7.878%  Short Length of Stay: 29.93%  Permanent Stroke: 2.871%  Prolonged Ventilation: 11.752%  DSW Infection: 0.252%  Renal Failure: 3.79%  Reoperation: 6.917%      Impression:  Patient has stage D severe symptomatic aortic stenosis. She describes a 3-6 month history of symptoms of exertional shortness of breath and fatigue consistent with chronic diastolic congestive heart failure, New [place] Heart Association functional class II. I personally reviewed the patient's recent transthoracic echocardiogram, diagnostic cardiac catheterization, and CT angiograms. Echocardiogram reveals a trileaflet aortic valve with moderate to severe thickening and calcification involving 2 of the 3 leaflets.  The third leaflet is only mildly thickened with mildly restricted mobility. Peak velocity across the aortic valve measured 4.3 m/s corresponding to mean transvalvular gradient estimated 43 mmHg. The DVI was 0.41, owing to the fact that the patient has relatively small sized aortic root. Left ventricular systolic function is hyperdynamic. Diagnostic cardiac catheterization is notable for the absence of significant coronary artery disease.  Overall I agree the patient would likely benefit from  aortic valve replacement, although the functional significance of her aortic stenosis is compounded by the presence of relatively small aortic root. Risks associated with conventional surgery would likely be at least moderately elevated and likely considerably higher than that predicted using STS risk calculator, because aortic root enlargement or replacement would be necessary to avoid leaving the patient with patient prosthesis mismatch.  Cardiac-gated CTA of the heart reveals anatomical characteristics consistent with aortic stenosis suitable for treatment by transcatheter aortic valve replacement without any significant complicating features other than the relatively small size of her aortic root, and CTA of the aorta and iliac vessels demonstrate what appears to be adequate pelvic vascular access to facilitate a transfemoral approach.  I favor proceeding with transcatheter aortic valve replacement using a supra-annular valve to minimize the chances of leaving the patient with significant patient prosthesis mismatch.    Plan:  The patient and her daughter were counseled at length regarding treatment alternatives for management of severe symptomatic aortic stenosis. Alternative approaches such as conventional aortic valve replacement, transcatheter aortic valve replacement, and palliative medical therapy were compared and contrasted at length.  The risks associated with  conventional surgical aortic valve replacement were been discussed in detail, as were expectations for post-operative convalescence.  Issues specific to transcatheter aortic valve replacement were discussed including questions about long term valve durability, the potential for paravalvular leak, possible increased risk of need for permanent pacemaker placement, and other technical complications related to the procedure itself.  Long-term prognosis with medical therapy was discussed. This discussion was placed in the context of the patient's  own specific clinical presentation and past medical history.  All of their questions been addressed.  The patient hopes to proceed with surgery as soon as practical. We tentatively plan for transcatheter aortic valve replacement via transfemoral approach on 10/08/2016.  Following the decision to proceed with transcatheter aortic valve replacement, a discussion has been held regarding what types of management strategies would be attempted intraoperatively in the event of life-threatening complications, including whether or not the patient would be considered a candidate for the use of cardiopulmonary bypass and/or conversion to open sternotomy for attempted surgical intervention.  The patient would desire to be considered candidate for emergency sternotomy if necessary. The patient has been advised of a variety of complications that might develop including but not limited to risks of death, stroke, paravalvular leak, aortic dissection or other major vascular complications, aortic annulus rupture, device embolization, cardiac rupture or perforation, mitral regurgitation, acute myocardial infarction, arrhythmia, heart block or bradycardia requiring permanent pacemaker placement, congestive heart failure, respiratory failure, renal failure, pneumonia, infection, other late complications related to structural valve deterioration or migration, or other complications that might ultimately cause a temporary or permanent loss of functional independence or other long term morbidity.  The patient provides full informed consent for the procedure as described and all questions were answered.   I spent in excess of 90 minutes during the conduct of this office consultation and >50% of this time involved direct face-to-face encounter with the patient for counseling and/or coordination of their care.   Valentina Gu. Roxy Manns, MD 09/25/2016 3:56 PM

## 2016-09-25 NOTE — Patient Instructions (Signed)
Continue all previous medications without any changes at this time  

## 2016-09-26 ENCOUNTER — Other Ambulatory Visit: Payer: Self-pay

## 2016-09-26 DIAGNOSIS — I350 Nonrheumatic aortic (valve) stenosis: Secondary | ICD-10-CM

## 2016-10-01 ENCOUNTER — Telehealth: Payer: Self-pay | Admitting: Cardiology

## 2016-10-01 NOTE — Telephone Encounter (Signed)
Dr. Agustin Cree has s/w the dentist personally.

## 2016-10-01 NOTE — Telephone Encounter (Signed)
Is having 7 teeth extracted and wants to know if she needs to be on an antibiotic

## 2016-10-02 ENCOUNTER — Encounter (HOSPITAL_COMMUNITY): Payer: Self-pay

## 2016-10-02 ENCOUNTER — Other Ambulatory Visit (HOSPITAL_COMMUNITY): Payer: Medicare Other

## 2016-10-02 ENCOUNTER — Encounter (HOSPITAL_COMMUNITY)
Admission: RE | Admit: 2016-10-02 | Discharge: 2016-10-02 | Disposition: A | Discharge status: Home / Self Care | Payer: Medicare Other | Source: Ambulatory Visit | Attending: Thoracic Surgery (Cardiothoracic Vascular Surgery) | Admitting: Thoracic Surgery (Cardiothoracic Vascular Surgery)

## 2016-10-02 ENCOUNTER — Institutional Professional Consult (permissible substitution) (INDEPENDENT_AMBULATORY_CARE_PROVIDER_SITE_OTHER): Payer: Medicare Other | Admitting: Surgery

## 2016-10-02 ENCOUNTER — Ambulatory Visit (HOSPITAL_COMMUNITY)
Admission: RE | Admit: 2016-10-02 | Discharge: 2016-10-02 | Disposition: A | Discharge status: Home / Self Care | Payer: Medicare Other | Source: Ambulatory Visit | Attending: Cardiovascular Disease | Admitting: Cardiovascular Disease

## 2016-10-02 ENCOUNTER — Encounter: Payer: Self-pay | Admitting: Surgery

## 2016-10-02 VITALS — BP 122/65 | HR 65 | Resp 16 | Ht 63.0 in | Wt 183.0 lb

## 2016-10-02 DIAGNOSIS — Z8673 Personal history of transient ischemic attack (TIA), and cerebral infarction without residual deficits: Secondary | ICD-10-CM | POA: Diagnosis not present

## 2016-10-02 DIAGNOSIS — I5032 Chronic diastolic (congestive) heart failure: Secondary | ICD-10-CM | POA: Insufficient documentation

## 2016-10-02 DIAGNOSIS — I110 Hypertensive heart disease with heart failure: Secondary | ICD-10-CM | POA: Insufficient documentation

## 2016-10-02 DIAGNOSIS — Z79899 Other long term (current) drug therapy: Secondary | ICD-10-CM | POA: Insufficient documentation

## 2016-10-02 DIAGNOSIS — Z0183 Encounter for blood typing: Secondary | ICD-10-CM | POA: Diagnosis not present

## 2016-10-02 DIAGNOSIS — Z01818 Encounter for other preprocedural examination: Secondary | ICD-10-CM | POA: Insufficient documentation

## 2016-10-02 DIAGNOSIS — I350 Nonrheumatic aortic (valve) stenosis: Secondary | ICD-10-CM

## 2016-10-02 DIAGNOSIS — Z01812 Encounter for preprocedural laboratory examination: Secondary | ICD-10-CM | POA: Diagnosis not present

## 2016-10-02 DIAGNOSIS — Z8249 Family history of ischemic heart disease and other diseases of the circulatory system: Secondary | ICD-10-CM | POA: Diagnosis not present

## 2016-10-02 DIAGNOSIS — Z823 Family history of stroke: Secondary | ICD-10-CM | POA: Diagnosis not present

## 2016-10-02 DIAGNOSIS — R05 Cough: Secondary | ICD-10-CM | POA: Diagnosis not present

## 2016-10-02 DIAGNOSIS — Z841 Family history of disorders of kidney and ureter: Secondary | ICD-10-CM | POA: Insufficient documentation

## 2016-10-02 LAB — COMPREHENSIVE METABOLIC PANEL
ALBUMIN: 4.1 g/dL (ref 3.5–5.0)
ALT: 16 U/L (ref 14–54)
ANION GAP: 10 (ref 5–15)
AST: 27 U/L (ref 15–41)
Alkaline Phosphatase: 58 U/L (ref 38–126)
BILIRUBIN TOTAL: 0.9 mg/dL (ref 0.3–1.2)
BUN: 19 mg/dL (ref 6–20)
CHLORIDE: 108 mmol/L (ref 101–111)
CO2: 20 mmol/L — AB (ref 22–32)
Calcium: 9.4 mg/dL (ref 8.9–10.3)
Creatinine, Ser: 0.9 mg/dL (ref 0.44–1.00)
GFR calc Af Amer: >60 mL/min (ref >60)
GFR calc non Af Amer: 60 mL/min — ABNORMAL LOW (ref >60)
GLUCOSE: 210 mg/dL — AB (ref 65–99)
POTASSIUM: 4 mmol/L (ref 3.5–5.1)
SODIUM: 138 mmol/L (ref 135–145)
Total Protein: 6.1 g/dL — ABNORMAL LOW (ref 6.5–8.1)

## 2016-10-02 LAB — BLOOD GAS, ARTERIAL
Acid-base deficit: 0.8 mmol/L (ref 0.0–2.0)
BICARBONATE: 22.7 mmol/L (ref 20.0–28.0)
Drawn by: 421801
FIO2: 21
O2 Saturation: 96.9 %
PATIENT TEMPERATURE: 98.6
PO2 ART: 85.8 mmHg (ref 83.0–108.0)
pCO2 arterial: 33.5 mmHg (ref 32.0–48.0)
pH, Arterial: 7.446 (ref 7.350–7.450)

## 2016-10-02 LAB — PROTIME-INR
INR: 1.09
PROTHROMBIN TIME: 14 s (ref 11.4–15.2)

## 2016-10-02 LAB — CBC
HEMATOCRIT: 39.9 % (ref 36.0–46.0)
Hemoglobin: 12.8 g/dL (ref 12.0–15.0)
MCH: 30.2 pg (ref 26.0–34.0)
MCHC: 32.1 g/dL (ref 30.0–36.0)
MCV: 94.1 fL (ref 78.0–100.0)
Platelets: 121 10*3/uL — ABNORMAL LOW (ref 150–400)
RBC: 4.24 MIL/uL (ref 3.87–5.11)
RDW: 13.4 % (ref 11.5–15.5)
WBC: 5.8 10*3/uL (ref 4.0–10.5)

## 2016-10-02 LAB — URINALYSIS, ROUTINE W REFLEX MICROSCOPIC
Bilirubin Urine: NEGATIVE
GLUCOSE, UA: 150 mg/dL — AB
Hgb urine dipstick: NEGATIVE
KETONES UR: 20 mg/dL — AB
LEUKOCYTES UA: NEGATIVE
NITRITE: NEGATIVE
PROTEIN: NEGATIVE mg/dL
Specific Gravity, Urine: 1.027 (ref 1.005–1.030)
pH: 5 (ref 5.0–8.0)

## 2016-10-02 LAB — ABO/RH: ABO/RH(D): O POS

## 2016-10-02 LAB — SURGICAL PCR SCREEN
MRSA, PCR: NEGATIVE
STAPHYLOCOCCUS AUREUS: NEGATIVE

## 2016-10-02 LAB — TYPE AND SCREEN
ABO/RH(D): O POS
Antibody Screen: NEGATIVE

## 2016-10-02 LAB — APTT: aPTT: 26 seconds (ref 24–36)

## 2016-10-02 LAB — HEMOGLOBIN A1C
Hgb A1c MFr Bld: 5.3 % (ref 4.8–5.6)
Mean Plasma Glucose: 105.41 mg/dL

## 2016-10-02 MED ORDER — CHLORHEXIDINE GLUCONATE 4 % EX LIQD: 60.0000 mL | Freq: Once | CUTANEOUS | Status: DC

## 2016-10-02 NOTE — Progress Notes (Addendum)
TCY:ELYH, Azzie Glatter, NP  Cardiologist: Dr. Burt Knack  EKG: 09/11/2016 in [facility]  Stress test: pt denies   ECHO: 07/18/16 in [facility]  Cardiac Cath: 09/11/16 in [facility]  Chest x-ray: 09/06/16 in [facility] and today

## 2016-10-02 NOTE — Pre-Procedure Instructions (Addendum)
Robin Sutton  10/02/2016      [place], [place] [address] Phone: [phone] Fax: [phone]    Your procedure is scheduled on October 08, 2016.  Report to [facility] Admitting at 800 AM.  Call this number if you have problems the morning of surgery:  [phone]   Remember:  Do not eat food or drink liquids after midnight October 07, 2016.  Take these medicines the morning of surgery with A SIP OF WATER (none).  7 days prior to surgery STOP taking any Aspirin (unless otherwise instructed by your surgeon), Aleve, Naproxen, Ibuprofen, Motrin, Advil, Goody's, BC's, all herbal medications, fish oil, and all vitamins   Do not wear jewelry, make-up or nail polish.  Do not wear lotions, powders, or perfumes, or deoderant.  Do not shave 48 hours prior to surgery.    Do not bring valuables to the hospital.  [facility] is not responsible for any belongings or valuables.  Contacts, dentures or bridgework may not be worn into surgery.  Leave your suitcase in the car.  After surgery it may be brought to your room.  For patients admitted to the hospital, discharge time will be determined by your treatment team.  Patients discharged the day of surgery will not be allowed to drive home.   Special instructions:   [place]- Preparing For Surgery  Before surgery, you can play an important role. Because skin is not sterile, your skin needs to be as free of germs as possible. You can reduce the number of germs on your skin by washing with CHG (chlorahexidine gluconate) Soap before surgery.  CHG is an antiseptic cleaner which kills germs and bonds with the skin to continue killing germs even after washing.  Please do not use if you have an allergy to CHG or antibacterial soaps. If your skin becomes reddened/irritated stop using the CHG.  Do not shave (including legs and underarms) for at least 48  hours prior to first CHG shower. It is OK to shave your face.  Please follow these instructions carefully.   1. Shower the NIGHT BEFORE SURGERY and the MORNING OF SURGERY with CHG.   2. If you chose to wash your hair, wash your hair first as usual with your normal shampoo.  3. After you shampoo, rinse your hair and body thoroughly to remove the shampoo.  4. Use CHG as you would any other liquid soap. You can apply CHG directly to the skin and wash gently with a scrungie or a clean washcloth.   5. Apply the CHG Soap to your body ONLY FROM THE NECK DOWN.  Do not use on open wounds or open sores. Avoid contact with your eyes, ears, mouth and genitals (private parts). Wash genitals (private parts) with your normal soap.  6. Wash thoroughly, paying special attention to the area where your surgery will be performed.  7. Thoroughly rinse your body with warm water from the neck down.  8. DO NOT shower/wash with your normal soap after using and rinsing off the CHG Soap.  9. Pat yourself dry with a CLEAN TOWEL.   10. Wear CLEAN PAJAMAS   11. Place CLEAN SHEETS on your bed the night of your first shower and DO NOT SLEEP WITH PETS.    Day of Surgery: Do not apply any deodorants/lotions. Please wear clean clothes to the hospital/surgery center.  Please read over the following fact sheets that you were given. Pain Booklet, Coughing and Deep Breathing, MRSA Information and Surgical Site Infection Prevention

## 2016-10-02 NOTE — Progress Notes (Signed)
Patient ID: Robin Sutton, female   DOB: October 14, 1935, 81 y.o.   MRN: [id number]  [place] [facility]GERY CONSULTATION REPORT  Referring Provider is Sherren Mocha, MD PCP is Maylon Cos, NP Primary Cardiologist is Jenne Campus, MD  Chief Complaint  Patient presents with  . Aortic Stenosis    2nd TAVR eval...for scheduled 10/09/14 surgery...DENTAL COMPLETE.Marland KitchenON ANTX    HPI:  The patient is a 81 year old woman with hypertension, remote stroke without residual deficit, aortic stenosis and chronic diastolic heart failure. Over the past several months she has developed exertional shortness of breath and fatigue. Her daughter is with her today and has noticed more than the patient has. She says that she has no energy and although she is still independent she can't do much activity. She was seen by her PCP and noted to have a prominent heart murmur and was referred to Dr. Agustin Cree. She had an echo showing severe aortic stenosis with a mean gradient of 47 mm Hg and a peak of 81 mm Hg. There was moderate AI. Cardiac cath showed mild to moderate difffuse non-obstructive CAD with a mean gradient of 25 mm Hg and a peak of 27 mm Hg with a calculated AVA of 1.3 cm2.   Past Medical History:  Diagnosis Date  . Hypertension   . Stroke [facility])     Past Surgical History:  Procedure Laterality Date  . BREAST BIOPSY    . RIGHT/LEFT HEART CATH AND CORONARY ANGIOGRAPHY N/A 09/11/2016   Procedure: RIGHT/LEFT HEART CATH AND CORONARY ANGIOGRAPHY;  Surgeon: Sherren Mocha, MD;  Location: [facility] [facility];  Service: Cardiovascular;  Laterality: N/A;  . TONSILLECTOMY      Family History  Problem Relation Age of Onset  . Hypertension Mother   . Heart disease Mother   . Stroke Mother   . Kidney failure Father   . Hypertension Brother   . Heart disease Brother   . Heart disease Maternal Grandfather   . Diabetes Maternal Grandfather      Social History   Social History  . Marital status: Widowed    Spouse name: N/A  . Number of children: N/A  . Years of education: N/A   Occupational History  . Not on file.   Social History Main Topics  . Smoking status: Never Smoker  . Smokeless tobacco: Never Used  . Alcohol use No  . Drug use: No  . Sexual activity: Not on file   Other Topics Concern  . Not on file   Social History Narrative  . No narrative on file    Current Outpatient Prescriptions  Medication Sig Dispense Refill  . AMOXICILLIN PO Take 1 tablet by mouth 3 (three) times daily. X 10 days    . aspirin 325 MG tablet Take 325 mg by mouth daily.    Marland Kitchen lisinopril (PRINIVIL,ZESTRIL) 10 MG tablet Take 10 mg by mouth every evening.   1  . polyethylene glycol powder (GLYCOLAX/MIRALAX) powder Take 17 g by mouth as needed for constipation.    . pravastatin (PRAVACHOL) 40 MG tablet Take 40 mg by mouth every evening.   1  . Vitamin D, Ergocalciferol, (DRISDOL) 50000 units CAPS capsule Take 50,000 Units by mouth every Monday.   1   No current facility-administered medications for this visit.    Facility-Administered Medications Ordered in Other Visits  Medication Dose Route Frequency Provider Last Rate Last Dose  . chlorhexidine (HIBICLENS) 4 %  liquid 4 application  60 mL Topical Once Sherren Mocha, MD       And  . Derrill Memo ON 10/03/2016] chlorhexidine (HIBICLENS) 4 % liquid 4 application  60 mL Topical Once Sherren Mocha, MD        Allergies  Allergen Reactions  . Adhesive [Tape] Dermatitis    Skin Irritation      Review of Systems:  General:                      normal appetite, decreased energy, no weight gain, no weight loss, no fever             Cardiac:                       no chest pain with exertion, no chest pain at rest, +SOB with exertion, no resting SOB, no PND, + orthopnea, no palpitations, no arrhythmia, no atrial fibrillation, + LE edema, no dizzy spells, no syncope              Respiratory:                 + exertional shortness of breath, no home oxygen, no productive cough, no dry cough, no bronchitis, no wheezing, no hemoptysis, no asthma, no pain with inspiration or cough, no sleep apnea, no CPAP at night             GI:                               no difficulty swallowing, no reflux, no frequent heartburn, no hiatal hernia, no abdominal pain, occasional constipation, no diarrhea, no hematochezia, no hematemesis, no melena             GU:                              no dysuria,  no frequency, no urinary tract infection, no hematuria, no kidney stones, no kidney disease             Vascular:                     no pain suggestive of claudication, no pain in feet, no leg cramps, + varicose veins, no DVT, no non-healing foot ulcer             Neuro:                         + stroke, no TIA's, no seizures, no headaches, no temporary blindness one eye,  no slurred speech, no peripheral neuropathy, no chronic pain, very mild instability of gait, no memory/cognitive dysfunction             Musculoskeletal:         mild arthritis, no joint swelling, no myalgias, no difficulty walking, normal mobility              Skin:                            no rash, no itching, no skin infections, no pressure sores or ulcerations             Psych:  no anxiety, no depression, no nervousness, no unusual recent stress             Eyes:                           no blurry vision, no floaters, no recent vision changes, + wears glasses or contacts             ENT:                            no hearing loss, no loose or painful teeth, upper dentures, just had the remainder of her teeth extracted earlier this week.             Hematologic:               no easy bruising, no abnormal bleeding, no clotting disorder, no frequent epistaxis             Endocrine:                   no diabetes, does not check CBG's at home                             Physical Exam:   BP  122/65 (BP Location: Right Arm, Patient Position: Sitting, Cuff Size: Large)   Pulse 65   Resp 16   Ht _0  (1.6 m)   Wt 183 lb (83 kg)   LMP  (Exact Date)   SpO2 96% Comment: ON RA  BMI 32.42 kg/m   General:  Elderly but  well-appearing  HEENT:  [place]/AT, PERLA, EOMI, lower gums swollen from recent extractions, some ecchymosis of jaw from extractions.  Neck:   no JVD, no bruits, no adenopathy or thyromegaly  Chest:   clear to auscultation, symmetrical breath sounds, no wheezes, no rhonchi   CV:   RRR, grade III/VI crescendo/decrescendo murmur heard best at RSB,  no diastolic murmur  Abdomen:  soft, non-tender, no masses or organomegaly  Extremities:  warm, well-perfused, pulses palpable in feet, no LE edema  Rectal/GU  Deferred  Neuro:   Grossly non-focal and symmetrical throughout  Skin:   Clean and dry, no rashes, no breakdown   Diagnostic Tests:    [facility]                       [place], [place] [address]                            [phone]  ------------------------------------------------------------------- Transthoracic Echocardiography  Patient:    Ayari, Liwanag MR #:       [id number] Study Date: 07/18/2016 Gender:     F Age:        11 Height:     160 cm Weight:     84.8 kg BSA:        1.98 m^2 Pt. Status: Room:   ATTENDING    Sherryl Barters     Krasowski, [place], Inpatient  cc:  ------------------------------------------------------------------- LV  EF: 65% -   70%  ------------------------------------------------------------------- Indications:      Aortic stenosis 424.1.  ------------------------------------------------------------------- History:   PMH:  Increased shortness of breath.  Risk factors: Hypertension.  Dyslipidemia.  ------------------------------------------------------------------- Study Conclusions  - Left ventricle: The cavity size was normal. Systolic function was   vigorous. The estimated ejection fraction was in the range of 65%   to 70%. Doppler parameters are consistent with abnormal left   ventricular relaxation (grade 1 diastolic dysfunction). - Aortic valve: AV is thickened, calcified with restricted motion.   Peak and mean gradients through the valve are 81 and 47 mm Hg   respectively consistent with sevrere AS. There was moderate   regurgitation.  ------------------------------------------------------------------- Study data:  No prior study was available for comparison.  Study status:  Routine.  Procedure:  The patient reported no pain pre or post test. Transthoracic echocardiography. Image quality was adequate.  Study completion:  There were no complications. Transthoracic echocardiography.  M-mode, complete 2D, spectral Doppler, and color Doppler.  Birthdate:  Patient birthdate: 11/29/1935.  Age:  Patient is 81 yr old.  Sex:  Gender: female. BMI: 33.1 kg/m^2.  Blood pressure:     120/64  Patient status: Outpatient.  Study date:  Study date: 07/18/2016. Study time: 01:18 PM.  Location:  Echo laboratory.  -------------------------------------------------------------------  ------------------------------------------------------------------- Left ventricle:  The cavity size was normal. Systolic function was vigorous. The estimated ejection fraction was in the range of 65% to 70%. Doppler parameters are consistent with abnormal left ventricular relaxation (grade 1 diastolic dysfunction).  ------------------------------------------------------------------- Aortic valve:  AV is thickened, calcified with restricted motion. Peak and mean gradients through the valve are 81 and 47 mm Hg respectively consistent with sevrere AS.  Doppler:  There was moderate  regurgitation.    VTI ratio of LVOT to aortic valve: 0.37. Valve area (VTI): 0.94 cm^2. Indexed valve area (VTI): 0.48 cm^2/m^2. Peak velocity ratio of LVOT to aortic valve: 0.41. Valve area (Vmax): 1.05 cm^2. Indexed valve area (Vmax): 0.53 cm^2/m^2. Mean velocity ratio of LVOT to aortic valve: 0.35. Valve area (Vmean): 0.9 cm^2. Indexed valve area (Vmean): 0.45 cm^2/m^2. Mean gradient (S): 43 mm Hg. Peak gradient (S): 76 mm Hg.  ------------------------------------------------------------------- Mitral valve:   Mildly thickened leaflets .  Doppler:  There was trivial regurgitation.    Peak gradient (D): 4 mm Hg.  ------------------------------------------------------------------- Left atrium:  The atrium was normal in size.  ------------------------------------------------------------------- Right ventricle:  The cavity size was normal. Wall thickness was normal. Systolic function was normal.  ------------------------------------------------------------------- Pulmonic valve:    Structurally normal valve.   Cusp separation was normal.  Doppler:  Transvalvular velocity was within the normal range. There was trivial regurgitation.  ------------------------------------------------------------------- Tricuspid valve:   Structurally normal valve.   Leaflet separation was normal.  Doppler:  Transvalvular velocity was within the normal range. There was no regurgitation.  ------------------------------------------------------------------- Right atrium:  The atrium was normal in size.  ------------------------------------------------------------------- Measurements   Left ventricle                           Value          Reference  LV ID, ED, PLAX chordal          (L)     40.4  mm       43 - 52  LV ID, ES, PLAX chordal  26.5  mm       23 - 38  LV fx shortening, PLAX chordal           34    %        >=29  LV PW thickness, ED                      11.5  mm        ----------  IVS/LV PW ratio, ED                      0.91           <=1.3  Stroke volume, 2D                        94    ml       ----------  Stroke volume/bsa, 2D                    48    ml/m^2   ----------  LV e&', lateral                           6.74  cm/s     ----------  LV E/e&', lateral                         14.11          ----------  LV e&', medial                            6.64  cm/s     ----------  LV E/e&', medial                          14.32          ----------  LV e&', average                           6.69  cm/s     ----------  LV E/e&', average                         14.22          ----------    Ventricular septum                       Value          Reference  IVS thickness, ED                        10.5  mm       ----------    LVOT                                     Value          Reference  LVOT ID, S                               18    mm       ----------  LVOT area  2.54  cm^2     ----------  LVOT peak velocity, S                    180   cm/s     ----------  LVOT mean velocity, S                    109   cm/s     ----------  LVOT VTI, S                              37    cm       ----------  LVOT peak gradient, S                    13    mm Hg    ----------    Aortic valve                             Value          Reference  Aortic valve peak velocity, S            435   cm/s     ----------  Aortic valve mean velocity, S            309   cm/s     ----------  Aortic valve VTI, S                      99.7  cm       ----------  Aortic mean gradient, S                  43    mm Hg    ----------  Aortic peak gradient, S                  76    mm Hg    ----------  VTI ratio, LVOT/AV                       0.37           ----------  Aortic valve area, VTI                   0.94  cm^2     ----------  Aortic valve area/bsa, VTI               0.48  cm^2/m^2 ----------  Velocity ratio, peak, LVOT/AV            0.41            ----------  Aortic valve area, peak velocity         1.05  cm^2     ----------  Aortic valve area/bsa, peak              0.53  cm^2/m^2 ----------  velocity  Velocity ratio, mean, LVOT/AV            0.35           ----------  Aortic valve area, mean velocity         0.9   cm^2     ----------  Aortic valve area/bsa, mean              0.45  cm^2/m^2 ----------  velocity  Aortic regurg pressure half-time  493   ms       ----------    Aorta                                    Value          Reference  Aortic root ID, ED                       29    mm       ----------    Left atrium                              Value          Reference  LA ID, A-P, ES                           44    mm       ----------  LA ID/bsa, A-P                   (H)     2.23  cm/m^2   <=2.2  LA volume, S                             84.1  ml       ----------  LA volume/bsa, S                         42.6  ml/m^2   ----------  LA volume, ES, 1-p A4C                   79.5  ml       ----------  LA volume/bsa, ES, 1-p A4C               40.2  ml/m^2   ----------  LA volume, ES, 1-p A2C                   81.2  ml       ----------  LA volume/bsa, ES, 1-p A2C               41.1  ml/m^2   ----------    Mitral valve                             Value          Reference  Mitral E-wave peak velocity              95.1  cm/s     ----------  Mitral A-wave peak velocity              123   cm/s     ----------  Mitral deceleration time         (H)     320   ms       150 - 230  Mitral peak gradient, D                  4     mm Hg    ----------  Mitral E/A ratio, peak  0.8            ----------    Pulmonary arteries                       Value          Reference  PA pressure, S, DP                       27    mm Hg    <=30    Tricuspid valve                          Value          Reference  Tricuspid regurg peak velocity           246   cm/s     ----------  Tricuspid peak RV-RA gradient            24    mm Hg     ----------    Right atrium                             Value          Reference  RA ID, S-I, ES, A4C              (H)     50.9  mm       34 - 49  RA area, ES, A4C                 (H)     20.8  cm^2     8.3 - 19.5  RA volume, ES, A/L                       64.6  ml       ----------  RA volume/bsa, ES, A/L                   32.7  ml/m^2   ----------    Systemic veins                           Value          Reference  Estimated CVP                            3     mm Hg    ----------    Right ventricle                          Value          Reference  RV ID, minor axis, ED, A4C base          38.4  mm       ----------  TAPSE                                    29.1  mm       ----------  RV pressure, S, DP                       27    mm Hg    <=30  RV s&',  lateral, S                        15.4  cm/s     ----------  Legend: (L)  and  (H)  mark values outside specified reference range.  ------------------------------------------------------------------- Prepared and Electronically Authenticated by  Dorris Carnes, M.D. 2018-07-12T14:36:38   Physicians   Panel Physicians Referring Physician Case Authorizing Physician  Sherren Mocha, MD (Primary)    Procedures   RIGHT/LEFT HEART CATH AND CORONARY ANGIOGRAPHY  Conclusion   1. Mild-moderate diffuse nonobstructive CAD (left dominant) 2. Moderate aortic stenosis by cath data with a mean transvalvular gradient of 25 mmHg and AVA 1.3 square cm 3. Normal right heart hemodynamics  Plan: echo data reviewed and is diagnostic of severe aortic stenosis with a peak transaortic velocity of 4.5 m/s and mean gradient 47 mmHg. Will refer to cardiac surgery for multidisciplinary valve team evaluation.   Indications   Severe aortic stenosis [I35.0 (ICD-10-CM)]  Procedural Details/Technique   Technical Details INDICATION: Severe symptomatic aortic stenosis  PROCEDURAL DETAILS: There was an indwelling IV in a right antecubital vein. Using  normal sterile technique, the IV was changed out for a 5 Fr brachial sheath over a 0.018 inch wire. The right wrist was then prepped, draped, and anesthetized with 1% lidocaine. Using the modified Seldinger technique a 5/6 French Slender sheath was placed in the right radial artery. Intra-arterial verapamil was administered through the radial artery sheath. IV heparin was administered after a JR4 catheter was advanced into the central aorta. A Swan-Ganz catheter was used for the right heart catheterization. Standard protocol was followed for recording of right heart pressures and sampling of oxygen saturations. Fick cardiac output was calculated. Standard Judkins catheters were used for selective coronary angiography. The right subclavian was very tortuous and catheter manipulation was difficult. The RCA was selectively engaged with a 5 Fr Easy-Rad guide catheter. The LCA was engaged with a JR 3.5 diagnostic catheter. The aortic valve was crossed with a JR4 catheter directing a J-wire into the LV. Pullback pressure was recorded. There were no immediate procedural complications. The patient was transferred to the post catheterization recovery area for further monitoring.     Estimated blood loss <50 mL.  During this procedure the patient was administered the following to achieve and maintain moderate conscious sedation: Versed 1 mg, Fentanyl 25 mcg, while the patient's heart rate, blood pressure, and oxygen saturation were continuously monitored. The period of conscious sedation was 39 minutes, of which I was present face-to-face 100% of this time.    Coronary Findings   Dominance: Left  Left Anterior Descending  Prox LAD lesion, 40% stenosed. The lesion is eccentric. The lesion is calcified. Nonobstructive proximal LAD lesion.  Left Circumflex  Mid Cx lesion, 30% stenosed.  First Obtuse Marginal Branch  1st Mrg lesion, 40% stenosed. The lesion is calcified.  Right Coronary Artery  Vessel is small.   Left Heart   Aortic Valve There is moderate aortic valve stenosis. The aortic valve is calcified. There is restricted aortic valve motion. Mean gradient 25 mmHg, AVA 1.3 square cm    Coronary Diagrams   Diagnostic Diagram       Implants     No implant documentation for this case.  PACS Images   Show images for CARDIAC CATHETERIZATION   Link to Procedure Log   Procedure Log    Hemo Data    Most Recent Value  Fick Cardiac Output 5.54 L/min  Fick Cardiac Output Index 2.96 (L/min)/BSA  Aortic Mean Gradient 25 mmHg  Aortic Peak Gradient 27 mmHg  Aortic Valve Area 1.30  Aortic Value Area Index 0.7 cm2/BSA  RA A Wave 12 mmHg  RA V Wave 9 mmHg  RA Mean 8 mmHg  RV Systolic Pressure 32 mmHg  RV Diastolic Pressure 5 mmHg  RV EDP 12 mmHg  PA Systolic Pressure 28 mmHg  PA Diastolic Pressure 10 mmHg  PA Mean 19 mmHg  PW A Wave 18 mmHg  PW V Wave 15 mmHg  PW Mean 14 mmHg  AO Systolic Pressure 974 mmHg  AO Diastolic Pressure 59 mmHg  AO Mean 84 mmHg  LV Systolic Pressure 163 mmHg  LV Diastolic Pressure 9 mmHg  LV EDP 17 mmHg  Arterial Occlusion Pressure Extended Systolic Pressure 845 mmHg  Arterial Occlusion Pressure Extended Diastolic Pressure 60 mmHg  Arterial Occlusion Pressure Extended Mean Pressure 87 mmHg  Left Ventricular Apex Extended Systolic Pressure 364 mmHg  Left Ventricular Apex Extended Diastolic Pressure 8 mmHg  Left Ventricular Apex Extended EDP Pressure 19 mmHg  QP/QS 1  TPVR Index 6.41 HRUI  TSVR Index 28.35 HRUI  PVR SVR Ratio 0.07  TPVR/TSVR Ratio 0.23    CT CORONARY MORPH W/CTA COR W/SCORE W/CA W/CM &/OR WO/CM (Accession [id number]) (Order [id number])  Imaging  Date: 09/19/2016 Department: [facility] CT IMAGING Released By: Reggy Eye Authorizing: Sherren Mocha, MD  Exam Information   Status Exam Begun  Exam Ended   Final [99] 09/19/2016 3:10 PM 09/19/2016 3:57 PM  PACS Images   Show images for CT CORONARY MORPH  W/CTA COR W/SCORE W/CA W/CM &/OR WO/CM  Addendum   ADDENDUM REPORT: 09/19/2016 17:38  CLINICAL DATA:  Aortic stenosis  EXAM: Cardiac TAVR CT  TECHNIQUE: The patient was scanned on a Siemens 889 slice Force scanner. A 100 kV retrospective scan was triggered in the ascending thoracic aorta at 140 HU's. Gantry rotation speed was 250 msecs and collimation was .6 mm. No beta blockade or nitro were given. The 3D data set was reconstructed in 5% intervals of the R-R cycle. Systolic and diastolic phases were analyzed on a dedicated work station using MPR, MIP and VRT modes. The patient received 80 cc of contrast.  FINDINGS: Aortic Valve: Tri leaflet, thickened and calcified with restricted motion  Aorta: Moderate mixed plaque in the aortic arch and descending thoracic aorta. Some calcium in the intervalvular fibrosa and MAC  Sinotubular Junction:  25 mm  Ascending Thoracic Aorta:  36 mm  Aortic Arch:  27 mm  Descending Thoracic Aorta:  23 mm  Sinus of Valsalva Measurements:  Non-coronary:  28.4 mm  Right -coronary:  27.5 mm  Left -coronary:  27.4 mm  Coronary Artery Height above Annulus:  Left Main:  10.8 mm above annulus  Right Coronary:  11.9 mm above annulus  Virtual Basal Annulus Measurements:  Maximum/Minimum Diameter:  21.4 mm x 16.8 mm  Perimeter:  62 mm  Area:  308 mm2  Coronary Arteries:  Sufficient height above annulus for deployment  Optimum Fluoroscopic Angle for Delivery: LAO 33 degrees Cranial 5 degrees  IMPRESSION: 1) Calcified tri leaflet aortic valve with annular area of 308 mm2 suitable for a 20 mm Sapien 3 valve and perimeter of 62 mm suitable for a 23 mm Evolut Pro valve  2) Optimum angiographic angle for deployment LAO 33 degrees Cranial 5 degrees  3) No LAA thrombus  4) Aortic root 3.6 cm  5) Coronary arteries  sufficient height above annulus for deployment  Jenkins Rouge   Electronically Signed    By: Jenkins Rouge M.D.   On: 09/19/2016 17:38   Addended by Josue Hector, MD on 09/19/2016 5:41 PM    Study Result   EXAM: OVER-READ INTERPRETATION  CT CHEST  The following report is an over-read performed by radiologist Dr. Rebekah Chesterfield [facility] on 09/19/2016. This over-read does not include interpretation of cardiac or coronary anatomy or pathology. The coronary calcium score/coronary CTA interpretation by the cardiologist is attached.  COMPARISON:  None.  FINDINGS: Extracardiac findings will be described under separate dictation for contemporaneously obtained CTA of the chest, abdomen and pelvis.  IMPRESSION: Please see separate dictation for contemporaneously obtained CTA of the chest, abdomen and pelvis dated 09/19/2016 for full description of relevant extracardiac findings.  Electronically Signed: By: Vinnie Langton M.D. On: 09/19/2016 16:12       CLINICAL DATA:  81 year old female with history of severe aortic stenosis. Preprocedural study prior to potential transcatheter aortic valve replacement (TAVR) procedure.  EXAM: CT ANGIOGRAPHY CHEST, ABDOMEN AND PELVIS  TECHNIQUE: Multidetector CT imaging through the chest, abdomen and pelvis was performed using the standard protocol during bolus administration of intravenous contrast. Multiplanar reconstructed images and MIPs were obtained and reviewed to evaluate the vascular anatomy.  CONTRAST:  100 mL of Isovue 370.  COMPARISON:  No priors.  FINDINGS: CTA CHEST FINDINGS  Cardiovascular: Heart size is mildly enlarged. There is no significant pericardial fluid, thickening or pericardial calcification. There is aortic atherosclerosis, as well as atherosclerosis of the great vessels of the mediastinum and the coronary arteries, including calcified atherosclerotic plaque in the left main, left anterior descending and left circumflex coronary arteries. Severe thickening  calcification of the aortic valve. Calcification of the mitral annulus.  Mediastinum/Lymph Nodes: No pathologically enlarged mediastinal or hilar lymph nodes. Please note that accurate exclusion of hilar adenopathy is limited on noncontrast CT scans. Small hiatal hernia. No axillary lymphadenopathy.  Lungs/Pleura: 3 x 9 mm (mean diameter of 6 mm) subpleural nodule in the posterior aspect of the right lower lobe (axial image 68 of series 14). No other larger more suspicious appearing pulmonary nodules or masses. No acute consolidative airspace disease. No pleural effusions.  Musculoskeletal/Soft Tissues: There are no aggressive appearing lytic or blastic lesions noted in the visualized portions of the skeleton.  CTA ABDOMEN AND PELVIS FINDINGS  Hepatobiliary: Diffuse low attenuation throughout the hepatic parenchyma, strongly suggestive of hepatic steatosis (difficult to say for certain on today's contrast enhanced examination). No definite cystic or solid hepatic lesions are identified. No intra or extrahepatic biliary ductal dilatation. Gallbladder is unremarkable in appearance.  Pancreas: No pancreatic mass. No pancreatic ductal dilatation. No pancreatic or peripancreatic fluid or inflammatory changes.  Spleen: Unremarkable.  Adrenals/Urinary Tract: Bilateral kidneys and bilateral adrenal glands are normal in appearance. No hydroureteronephrosis. Urinary bladder is normal in appearance.  Stomach/Bowel: Normal appearance of the stomach. No pathologic dilatation of small bowel or colon. A few scattered colonic diverticulae are noted, without surrounding inflammatory changes to suggest an acute diverticulitis at this time. Normal appendix.  Vascular/Lymphatic: Aortic atherosclerosis, with vascular findings and measurements pertinent to potential TAVR procedure, as detailed below. No aneurysm or dissection noted in the abdominal or pelvic vasculature. The celiac  axis, superior mesenteric artery and inferior mesenteric artery are all patent without hemodynamically significant stenosis. Renal arteries are patent bilaterally (2 on the right and 1 on the left) without definite hemodynamically significant stenoses. No  lymphadenopathy noted in the abdomen or pelvis.  Reproductive: 4.5 x 4.9 x 4.3 cm heterogeneously enhancing lesion in the right side of the uterine body, incompletely characterized but likely to represent a a leiomyoma. Ovaries are atrophic.  Other: No significant volume of ascites.  No pneumoperitoneum.  Musculoskeletal: There are no aggressive appearing lytic or blastic lesions noted in the visualized portions of the skeleton.  VASCULAR MEASUREMENTS PERTINENT TO TAVR:  AORTA:  Minimal Aortic Diameter -  14 x 14 mm  Severity of Aortic Calcification -  mild  RIGHT PELVIS:  Right Common Iliac Artery -  Minimal Diameter - 100. x 10.4 mm  Tortuosity - mild  Calcification - mild  Right External Iliac Artery -  Minimal Diameter - 8.3 x 7.7 mm  Tortuosity - severe  Calcification - mild  Right Common Femoral Artery -  Minimal Diameter - 7.7 x 8.7 mm  Tortuosity - mild  Calcification - mild  LEFT PELVIS:  Left Common Iliac Artery -  Minimal Diameter - 9.9 x 9.2 mm  Tortuosity - mild  Calcification - mild  Left External Iliac Artery -  Minimal Diameter - 8.2 x 7.9 mm  Tortuosity - severe  Calcification - minimal  Left Common Femoral Artery -  Minimal Diameter - 8.5 x 8.7 mm  Tortuosity - mild  Calcification - none  Review of the MIP images confirms the above findings.  IMPRESSION: 1. Vascular findings and measurements pertinent to potential TAVR procedure, as detailed above. This patient does have suitable pelvic arterial access bilaterally. 2. Severe thickening calcification of the aortic valve, compatible with the reported clinical history of severe aortic  stenosis. 3. Aortic atherosclerosis, in addition to left main and 2 vessel coronary artery disease. Assessment for potential risk factor modification, dietary therapy or pharmacologic therapy may be warranted, if clinically indicated. 4. Probable fibroid in the uterus, as discussed above. 5. Additional incidental findings, as above. Aortic Atherosclerosis (ICD10-I70.0).   Electronically Signed   By: Vinnie Langton M.D.   On: 09/20/2016 08:21  STS Risk Calculator Procedure: AV Replacement  Risk of Mortality: 2.283%  Morbidity or Mortality: 16.689%  Long Length of Stay: 7.878%  Short Length of Stay: 29.93%  Permanent Stroke: 2.871%  Prolonged Ventilation: 11.752%  DSW Infection: 0.252%  Renal Failure: 3.79%  Reoperation: 6.917%    Impression:  The patient is a 81 year old woman with stage D, severe, symptomatic aortic stenosis with NYHA class II symptoms of exertional shortness of breath and fatigue consistent with chronic diastolic heart failure. I have personally reviewed the echo, cath and CTA studies. Echo shows a trileaflet aortic valve with severe thickening and calcification of the leaflets with restricted mobility. The mean gradient is 43 mm Hg consistent with severe AS. Cath shows no significant CAD. I agree that AVR is indicated in this patient. I think the risk of open surgical AVR would be at least moderately increased due to her age and small aortic root that would likely require annular enlargement or root replacement to prevent patient-prosthesis mismatch. I think TAVR would be a reasonable alternative. Her gated cardiac CTA shows a small aortic annulus that is suitable for a 20 mm Sapien 3 valve or a 23 mm Evolut Pro valve. Given her small annular size I think a 23 mm Evolut Pro would be the best choice to give her the lowest gradient. Her abdominal and pelvic CT shows anatomy suitable for transfemoral insertion.   The patient and her daughter were counseled at  length  regarding treatment alternatives for management of severe symptomatic aortic stenosis. The risks and benefits of surgical intervention has been discussed in detail. Long-term prognosis with medical therapy was discussed. Alternative approaches such as conventional surgical aortic valve replacement, transcatheter aortic valve replacement, and palliative medical therapy were compared and contrasted at length. This discussion was placed in the context of the patient's own specific clinical presentation and past medical history. All of their questions have been addressed.    Following the decision to proceed with transcatheter aortic valve replacement, a discussion was held regarding what types of management strategies would be attempted intraoperatively in the event of life-threatening complications, including whether or not the patient would be considered a candidate for the use of cardiopulmonary bypass and/or conversion to open sternotomy for attempted surgical intervention. The patient has been advised of a variety of complications that might develop including but not limited to risks of death, stroke, paravalvular leak, aortic dissection or other major vascular complications, aortic annulus rupture, device embolization, cardiac rupture or perforation, mitral regurgitation, acute myocardial infarction, arrhythmia, heart block or bradycardia requiring permanent pacemaker placement, congestive heart failure, respiratory failure, renal failure, pneumonia, infection, other late complications related to structural valve deterioration or migration, or other complications that might ultimately cause a temporary or permanent loss of functional independence or other long term morbidity. The patient provides full informed consent for the procedure as described and all questions were answered.     Plan:  Transfemoral TAVR using a 23 mm Evolut Pro valve.   I spent 30 minutes performing this consultation and > 50% of  this time was spent face to face counseling and coordinating the care of this patient's severe aortic stenosis.    Gaye Pollack, MD 10/02/2016

## 2016-10-03 ENCOUNTER — Inpatient Hospital Stay (HOSPITAL_COMMUNITY): Payer: Medicare Other | Admitting: Emergency Medicine

## 2016-10-03 ENCOUNTER — Inpatient Hospital Stay (HOSPITAL_COMMUNITY): Payer: Medicare Other | Admitting: Certified Registered"

## 2016-10-03 ENCOUNTER — Encounter: Payer: Self-pay | Admitting: Cardiology

## 2016-10-03 ENCOUNTER — Ambulatory Visit (INDEPENDENT_AMBULATORY_CARE_PROVIDER_SITE_OTHER): Payer: Medicare Other | Admitting: Cardiology

## 2016-10-03 VITALS — BP 120/72 | HR 84 | Resp 14 | Ht 63.0 in | Wt 181.8 lb

## 2016-10-03 DIAGNOSIS — I350 Nonrheumatic aortic (valve) stenosis: Secondary | ICD-10-CM

## 2016-10-03 DIAGNOSIS — I6930 Unspecified sequelae of cerebral infarction: Secondary | ICD-10-CM

## 2016-10-03 DIAGNOSIS — I10 Essential (primary) hypertension: Secondary | ICD-10-CM | POA: Diagnosis not present

## 2016-10-03 DIAGNOSIS — E785 Hyperlipidemia, unspecified: Secondary | ICD-10-CM

## 2016-10-03 DIAGNOSIS — R0609 Other forms of dyspnea: Secondary | ICD-10-CM

## 2016-10-03 NOTE — Progress Notes (Signed)
Cardiology Office Note:    Date:  10/03/2016   ID:  Robin Sutton, DOB 05-Jul-1935, MRN [id number]  PCP:  Maylon Cos, NP  Cardiologist:  Jenne Campus, MD    Referring MD: Maylon Cos, NP   Chief Complaint  Patient presents with  . 1 month follow up  I'm getting ready to have my surgery  History of Present Illness:    Robin Sutton is a 81 y.o. female  With severe aortic valve stenosis. She was referred for potential aortic valve replacement.extension evaluation was performed and she was qualified for TAVR. We spent a long time talking about the procedure she was very well informed very well prepared for the procedure. I wish her lack.  Past Medical History:  Diagnosis Date  . Hypertension   . Stroke [facility])     Past Surgical History:  Procedure Laterality Date  . BREAST BIOPSY    . RIGHT/LEFT HEART CATH AND CORONARY ANGIOGRAPHY N/A 09/11/2016   Procedure: RIGHT/LEFT HEART CATH AND CORONARY ANGIOGRAPHY;  Surgeon: Sherren Mocha, MD;  Location: [facility];  Service: Cardiovascular;  Laterality: N/A;  . TONSILLECTOMY      Current Medications: Current Meds  Medication Sig  . AMOXICILLIN PO Take 1 tablet by mouth 3 (three) times daily. X 10 days  . aspirin 325 MG tablet Take 325 mg by mouth daily.  Marland Kitchen lisinopril (PRINIVIL,ZESTRIL) 10 MG tablet Take 10 mg by mouth every evening.   . polyethylene glycol powder (GLYCOLAX/MIRALAX) powder Take 17 g by mouth as needed for constipation.  . pravastatin (PRAVACHOL) 40 MG tablet Take 40 mg by mouth every evening.   . Vitamin D, Ergocalciferol, (DRISDOL) 50000 units CAPS capsule Take 50,000 Units by mouth every Monday.      Allergies:   Adhesive [tape]   Social History   Social History  . Marital status: Widowed    Spouse name: N/A  . Number of children: N/A  . Years of education: N/A   Social History Main Topics  . Smoking status: Never Smoker  . Smokeless tobacco: Never Used  . Alcohol use No  .  Drug use: No  . Sexual activity: Not Asked   Other Topics Concern  . None   Social History Narrative  . None     Family History: The patient's family history includes Diabetes in her maternal grandfather; Heart disease in her brother, maternal grandfather, and mother; Hypertension in her brother and mother; Kidney failure in her father; Stroke in her mother. ROS:   Please see the history of present illness.    All 14 point review of systems negative except as described per history of present illness  EKGs/Labs/Other Studies Reviewed:      Recent Labs: 10/02/2016: ALT 16; BUN 19; Creatinine, Ser 0.90; Hemoglobin 12.8; Platelets 121; Potassium 4.0; Sodium 138  Recent Lipid Panel No results found for: CHOL, TRIG, HDL, CHOLHDL, VLDL, LDLCALC, LDLDIRECT  Physical Exam:    VS:  BP 120/72   Pulse 84   Resp 14   Ht 5\' 3"  (1.6 m)   Wt 181 lb 12.8 oz (82.5 kg)   BMI 32.20 kg/m     Wt Readings from Last 3 Encounters:  10/03/16 181 lb 12.8 oz (82.5 kg)  10/02/16 183 lb (83 kg)  10/02/16 181 lb 14.4 oz (82.5 kg)     GEN:  Well nourished, well developed in no acute distress HEENT: Normal NECK: No JVD; No carotid bruits LYMPHATICS: No lymphadenopathy CARDIAC: RRR, ystolic  ejection murmur, no rubs, no gallops RESPIRATORY:  Clear to auscultation without rales, wheezing or rhonchi  ABDOMEN: Soft, non-tender, non-distended MUSCULOSKELETAL:  No edema; No deformity  SKIN: Warm and dry LOWER EXTREMITIES: no swelling NEUROLOGIC:  Alert and oriented x 3 PSYCHIATRIC:  Normal affect   ASSESSMENT:    No diagnosis found. PLAN:    In order of problems listed above:  1. Severe aortic stenosis: Awaiting TAVR, I asked her not to exert herself until procedure will be done. Yesterday she had some extensive dental work done in preparation for her aortic valve replacement 2. Essential hypertension: Blood pressure well-controlled 3. Dyslipidemia: On statin   Medication Adjustments/Labs  and Tests Ordered: Current medicines are reviewed at length with the patient today.  Concerns regarding medicines are outlined above.  No orders of the defined types were placed in this encounter.  Medication changes: No orders of the defined types were placed in this encounter.   Signed, Park Liter, MD, [facility] 10/03/2016 2:19 PM    [facility]

## 2016-10-03 NOTE — Patient Instructions (Signed)
Medication Instructions:  Your physician recommends that you continue on your current medications as directed. Please refer to the Current Medication list given to you today.  Labwork: None  Testing/Procedures: None   Follow-Up: Your physician recommends that you schedule a follow-up appointment in: 2 months  Any Other Special Instructions Will Be Listed Below (If Applicable).  Please note that any paperwork needing to be filled out by the provider will need to be addressed at the front desk prior to seeing the provider. Please note that any paperwork FMLA, Disability or other documents regarding health condition is subject to a $25.00 charge that must be received prior to completion of paperwork in the form of a money order or check.     If you need a refill on your cardiac medications before your next appointment, please call your pharmacy.

## 2016-10-04 DIAGNOSIS — Z23 Encounter for immunization: Secondary | ICD-10-CM | POA: Diagnosis not present

## 2016-10-07 ENCOUNTER — Telehealth: Payer: Self-pay | Admitting: Cardiovascular Disease

## 2016-10-07 MED ORDER — SODIUM CHLORIDE 0.9 % IV SOLN
INTRAVENOUS | Status: DC
  Filled 2016-10-07: qty 30

## 2016-10-07 MED ORDER — CHLORHEXIDINE GLUCONATE 0.12 % MT SOLN
15.0000 mL | Freq: Once | OROMUCOSAL | Status: AC
  Administered 2016-10-08: 15 mL via OROMUCOSAL
  Filled 2016-10-07: qty 15

## 2016-10-07 MED ORDER — PHENYLEPHRINE HCL 10 MG/ML IJ SOLN
30.0000 ug/min | INTRAMUSCULAR | Status: DC
  Filled 2016-10-07: qty 2

## 2016-10-07 MED ORDER — NOREPINEPHRINE BITARTRATE 1 MG/ML IV SOLN
0.0000 ug/min | INTRAVENOUS | Status: DC
  Filled 2016-10-07: qty 4

## 2016-10-07 MED ORDER — DEXTROSE 5 % IV SOLN
1.5000 g | INTRAVENOUS | Status: DC
  Filled 2016-10-07 (×2): qty 1.5

## 2016-10-07 MED ORDER — SODIUM CHLORIDE 0.9 % IV SOLN
INTRAVENOUS | Status: DC
  Filled 2016-10-07: qty 1

## 2016-10-07 MED ORDER — NITROGLYCERIN IN D5W 200-5 MCG/ML-% IV SOLN
2.0000 ug/min | INTRAVENOUS | Status: DC
  Filled 2016-10-07: qty 250

## 2016-10-07 MED ORDER — MAGNESIUM SULFATE 50 % IJ SOLN
40.0000 meq | INTRAMUSCULAR | Status: DC
  Filled 2016-10-07: qty 10

## 2016-10-07 MED ORDER — VANCOMYCIN HCL 10 G IV SOLR
1250.0000 mg | INTRAVENOUS | Status: DC
  Filled 2016-10-07: qty 1250

## 2016-10-07 MED ORDER — DOPAMINE-DEXTROSE 3.2-5 MG/ML-% IV SOLN
0.0000 ug/kg/min | INTRAVENOUS | Status: DC
  Filled 2016-10-07: qty 250

## 2016-10-07 MED ORDER — SODIUM CHLORIDE 0.9 % IV SOLN: INTRAVENOUS | Status: DC

## 2016-10-07 MED ORDER — EPINEPHRINE PF 1 MG/ML IJ SOLN
0.0000 ug/min | INTRAMUSCULAR | Status: DC
  Filled 2016-10-07: qty 4

## 2016-10-07 MED ORDER — DEXMEDETOMIDINE HCL IN NACL 400 MCG/100ML IV SOLN
0.1000 ug/kg/h | INTRAVENOUS | Status: DC
  Filled 2016-10-07: qty 100

## 2016-10-07 MED ORDER — POTASSIUM CHLORIDE 2 MEQ/ML IV SOLN
80.0000 meq | INTRAVENOUS | Status: DC
  Filled 2016-10-07: qty 40

## 2016-10-07 NOTE — Progress Notes (Signed)
Dr.Cooper's nurse returned call.  Stated pt was okay to move forward with surgery tomorrow.  Returned call to pt daughter to inform her.

## 2016-10-07 NOTE — Progress Notes (Signed)
Pt daughter called stating that patient took 325mg  ASA on 10/06/16 and 10/05/16. Message left for Dr. Antionette Char nurse to notify patient if this is a problem regarding surgery tomorrow.

## 2016-10-07 NOTE — Telephone Encounter (Signed)
New message    Ailene Ravel at [facility] is calling. She said pt is coming for surgery tomorrow, but pt took full strength aspirin last night and the night before. They need to know if this is a problem or not.

## 2016-10-07 NOTE — Telephone Encounter (Signed)
Per Dr. Burt Knack, informed Short Stay that it is fine the patient took ASA 325 mg.  They were grateful for clarification.

## 2016-10-08 ENCOUNTER — Ambulatory Visit (HOSPITAL_COMMUNITY): Payer: Medicare Other

## 2016-10-08 ENCOUNTER — Ambulatory Visit (HOSPITAL_COMMUNITY)
Admission: RE | Admit: 2016-10-08 | Discharge: 2016-10-08 | Disposition: A | Discharge status: Home / Self Care | Payer: Medicare Other | Source: Ambulatory Visit | Attending: Cardiovascular Disease | Admitting: Cardiovascular Disease

## 2016-10-08 ENCOUNTER — Encounter (HOSPITAL_COMMUNITY)
Admission: RE | Disposition: A | Discharge status: Home / Self Care | Payer: Self-pay | Source: Ambulatory Visit | Attending: Cardiovascular Disease

## 2016-10-08 ENCOUNTER — Encounter (HOSPITAL_COMMUNITY): Payer: Self-pay

## 2016-10-08 DIAGNOSIS — Z8673 Personal history of transient ischemic attack (TIA), and cerebral infarction without residual deficits: Secondary | ICD-10-CM | POA: Diagnosis not present

## 2016-10-08 DIAGNOSIS — Z833 Family history of diabetes mellitus: Secondary | ICD-10-CM | POA: Insufficient documentation

## 2016-10-08 DIAGNOSIS — Z8249 Family history of ischemic heart disease and other diseases of the circulatory system: Secondary | ICD-10-CM | POA: Insufficient documentation

## 2016-10-08 DIAGNOSIS — Z5309 Procedure and treatment not carried out because of other contraindication: Secondary | ICD-10-CM | POA: Diagnosis not present

## 2016-10-08 DIAGNOSIS — Z841 Family history of disorders of kidney and ureter: Secondary | ICD-10-CM | POA: Diagnosis not present

## 2016-10-08 DIAGNOSIS — I358 Other nonrheumatic aortic valve disorders: Secondary | ICD-10-CM | POA: Diagnosis present

## 2016-10-08 DIAGNOSIS — Z823 Family history of stroke: Secondary | ICD-10-CM | POA: Insufficient documentation

## 2016-10-08 DIAGNOSIS — I350 Nonrheumatic aortic (valve) stenosis: Secondary | ICD-10-CM | POA: Diagnosis not present

## 2016-10-08 DIAGNOSIS — I10 Essential (primary) hypertension: Secondary | ICD-10-CM | POA: Insufficient documentation

## 2016-10-08 SURGERY — IMPLANTATION, AORTIC VALVE, TRANSCATHETER, FEMORAL APPROACH
Anesthesia: General | Site: Chest

## 2016-10-08 MED ORDER — MIDAZOLAM HCL 2 MG/2ML IJ SOLN
2.0000 mg | Freq: Once | INTRAMUSCULAR | Status: AC
  Administered 2016-10-08 (×2): 1 mg via INTRAVENOUS
  Filled 2016-10-08: qty 2

## 2016-10-08 MED ORDER — FENTANYL CITRATE (PF) 100 MCG/2ML IJ SOLN
INTRAMUSCULAR | Status: AC
  Administered 2016-10-08: 50 ug via INTRAVENOUS
  Filled 2016-10-08: qty 2

## 2016-10-08 MED ORDER — CHLORHEXIDINE GLUCONATE 4 % EX LIQD: 30.0000 mL | CUTANEOUS | Status: DC

## 2016-10-08 MED ORDER — MIDAZOLAM HCL 2 MG/2ML IJ SOLN
INTRAMUSCULAR | Status: AC
  Filled 2016-10-08: qty 2

## 2016-10-08 MED ORDER — FENTANYL CITRATE (PF) 250 MCG/5ML IJ SOLN
INTRAMUSCULAR | Status: AC
  Filled 2016-10-08: qty 5

## 2016-10-08 MED ORDER — FENTANYL CITRATE (PF) 100 MCG/2ML IJ SOLN
100.0000 ug | Freq: Once | INTRAMUSCULAR | Status: AC
  Administered 2016-10-08: 50 ug via INTRAVENOUS
  Filled 2016-10-08: qty 2

## 2016-10-08 MED ORDER — MIDAZOLAM HCL 2 MG/2ML IJ SOLN
INTRAMUSCULAR | Status: AC
  Administered 2016-10-08: 1 mg via INTRAVENOUS
  Filled 2016-10-08: qty 2

## 2016-10-08 NOTE — Anesthesia Procedure Notes (Signed)
Arterial Line Insertion Start/End10/02/2016 9:41 AM Performed by: Josephine Igo, CRNA  Patient location: Pre-op. Preanesthetic checklist: patient identified, IV checked, risks and benefits discussed, surgical consent, monitors and equipment checked and pre-op evaluation Lidocaine 1% used for infiltration and patient sedated Right, radial was placed Catheter size: 20 G Hand hygiene performed  and maximum sterile barriers used   Attempts: 1 Procedure performed without using ultrasound guided technique. Following insertion, dressing applied and Biopatch. Post procedure assessment: normal  Patient tolerated the procedure well with no immediate complications.

## 2016-10-08 NOTE — Progress Notes (Signed)
Pt's surgery cancelled due to another emergency case.  Central line placed by Dr. Ermalene Postin, central line removed by Dr. Royce Macadamia.  Pressure dressing held 9 mins.  Pt tolerated procedure well.   Pt belongings packed, NT assisted to car via wheelchair.

## 2016-10-09 ENCOUNTER — Other Ambulatory Visit: Payer: Self-pay

## 2016-10-09 DIAGNOSIS — I350 Nonrheumatic aortic (valve) stenosis: Secondary | ICD-10-CM

## 2016-10-10 NOTE — Progress Notes (Signed)
CBC, CMP, T&S repeated day of surgery, per Dr. Burt Knack.  Other labs do not need to be repeated.

## 2016-10-16 ENCOUNTER — Encounter (HOSPITAL_COMMUNITY): Payer: Self-pay | Admitting: *Deleted

## 2016-10-16 MED ORDER — NITROGLYCERIN IN D5W 200-5 MCG/ML-% IV SOLN
2.0000 ug/min | INTRAVENOUS | Status: DC
  Filled 2016-10-16: qty 250

## 2016-10-16 MED ORDER — TRANEXAMIC ACID (OHS) BOLUS VIA INFUSION
15.0000 mg/kg | INTRAVENOUS | Status: DC
Start: 2016-10-17 — End: 2016-10-16

## 2016-10-16 MED ORDER — HEPARIN SODIUM (PORCINE) 1000 UNIT/ML IJ SOLN
INTRAMUSCULAR | Status: DC
  Filled 2016-10-16: qty 30

## 2016-10-16 MED ORDER — TRANEXAMIC ACID 1000 MG/10ML IV SOLN: 1.5000 mg/kg/h | INTRAVENOUS | Status: DC

## 2016-10-16 MED ORDER — PHENYLEPHRINE HCL 10 MG/ML IJ SOLN
30.0000 ug/min | INTRAMUSCULAR | Status: DC
  Filled 2016-10-16: qty 2

## 2016-10-16 MED ORDER — DEXTROSE 5 % IV SOLN: 750.0000 mg | INTRAVENOUS | Status: DC

## 2016-10-16 MED ORDER — DOPAMINE-DEXTROSE 3.2-5 MG/ML-% IV SOLN
0.0000 ug/kg/min | INTRAVENOUS | Status: DC
  Filled 2016-10-16: qty 250

## 2016-10-16 MED ORDER — DEXTROSE 5 % IV SOLN
1.5000 g | INTRAVENOUS | Status: AC
  Administered 2016-10-17: 1.5 g via INTRAVENOUS
  Filled 2016-10-16 (×2): qty 1.5

## 2016-10-16 MED ORDER — DEXMEDETOMIDINE HCL IN NACL 400 MCG/100ML IV SOLN
0.1000 ug/kg/h | INTRAVENOUS | Status: AC
  Administered 2016-10-17: 6 ug/kg/h via INTRAVENOUS
  Filled 2016-10-16: qty 100

## 2016-10-16 MED ORDER — SODIUM CHLORIDE 0.9 % IV SOLN
INTRAVENOUS | Status: DC
  Filled 2016-10-16 (×2): qty 1

## 2016-10-16 MED ORDER — NOREPINEPHRINE BITARTRATE 1 MG/ML IV SOLN
0.0000 ug/min | INTRAVENOUS | Status: AC
  Administered 2016-10-17: 2 ug/min via INTRAVENOUS
  Filled 2016-10-16: qty 4

## 2016-10-16 MED ORDER — VANCOMYCIN HCL 10 G IV SOLR
1250.0000 mg | INTRAVENOUS | Status: AC
  Administered 2016-10-17: 1250 mg via INTRAVENOUS
  Filled 2016-10-16: qty 1250

## 2016-10-16 MED ORDER — POTASSIUM CHLORIDE 2 MEQ/ML IV SOLN
80.0000 meq | INTRAVENOUS | Status: DC
  Filled 2016-10-16: qty 40

## 2016-10-16 MED ORDER — EPINEPHRINE PF 1 MG/ML IJ SOLN
0.0000 ug/min | INTRAMUSCULAR | Status: DC
  Filled 2016-10-16: qty 4

## 2016-10-16 MED ORDER — TRANEXAMIC ACID (OHS) PUMP PRIME SOLUTION: 2.0000 mg/kg | INTRAVENOUS | Status: DC

## 2016-10-16 MED ORDER — MAGNESIUM SULFATE 50 % IJ SOLN
40.0000 meq | INTRAMUSCULAR | Status: DC
  Filled 2016-10-16: qty 10

## 2016-10-16 MED ORDER — PLASMA-LYTE 148 IV SOLN
INTRAVENOUS | Status: DC
  Filled 2016-10-16: qty 2.5

## 2016-10-17 ENCOUNTER — Inpatient Hospital Stay (HOSPITAL_COMMUNITY): Payer: Medicare Other

## 2016-10-17 ENCOUNTER — Inpatient Hospital Stay (HOSPITAL_COMMUNITY): Payer: Medicare Other | Admitting: Certified Registered"

## 2016-10-17 ENCOUNTER — Encounter (HOSPITAL_COMMUNITY): Payer: Self-pay | Admitting: Certified Registered"

## 2016-10-17 ENCOUNTER — Encounter (HOSPITAL_COMMUNITY)
Admission: RE | Disposition: A | Discharge status: Home / Self Care | Payer: Self-pay | Source: Ambulatory Visit | Attending: Cardiovascular Disease

## 2016-10-17 ENCOUNTER — Inpatient Hospital Stay (HOSPITAL_COMMUNITY)
Admission: RE | Admit: 2016-10-17 | Discharge: 2016-10-21 | DRG: 267 | Disposition: A | Discharge status: Home / Self Care | Payer: Medicare Other | Source: Ambulatory Visit | Attending: Cardiovascular Disease | Admitting: Cardiovascular Disease

## 2016-10-17 DIAGNOSIS — Z833 Family history of diabetes mellitus: Secondary | ICD-10-CM | POA: Diagnosis not present

## 2016-10-17 DIAGNOSIS — I69351 Hemiplegia and hemiparesis following cerebral infarction affecting right dominant side: Secondary | ICD-10-CM | POA: Diagnosis not present

## 2016-10-17 DIAGNOSIS — I350 Nonrheumatic aortic (valve) stenosis: Principal | ICD-10-CM

## 2016-10-17 DIAGNOSIS — Z952 Presence of prosthetic heart valve: Secondary | ICD-10-CM | POA: Diagnosis not present

## 2016-10-17 DIAGNOSIS — I2510 Atherosclerotic heart disease of native coronary artery without angina pectoris: Secondary | ICD-10-CM | POA: Diagnosis present

## 2016-10-17 DIAGNOSIS — E785 Hyperlipidemia, unspecified: Secondary | ICD-10-CM | POA: Diagnosis present

## 2016-10-17 DIAGNOSIS — R0989 Other specified symptoms and signs involving the circulatory and respiratory systems: Secondary | ICD-10-CM | POA: Diagnosis present

## 2016-10-17 DIAGNOSIS — Z841 Family history of disorders of kidney and ureter: Secondary | ICD-10-CM

## 2016-10-17 DIAGNOSIS — Z8249 Family history of ischemic heart disease and other diseases of the circulatory system: Secondary | ICD-10-CM | POA: Diagnosis not present

## 2016-10-17 DIAGNOSIS — I447 Left bundle-branch block, unspecified: Secondary | ICD-10-CM | POA: Diagnosis present

## 2016-10-17 DIAGNOSIS — R55 Syncope and collapse: Secondary | ICD-10-CM | POA: Diagnosis present

## 2016-10-17 DIAGNOSIS — Z954 Presence of other heart-valve replacement: Secondary | ICD-10-CM | POA: Diagnosis not present

## 2016-10-17 DIAGNOSIS — J9811 Atelectasis: Secondary | ICD-10-CM | POA: Diagnosis not present

## 2016-10-17 DIAGNOSIS — I110 Hypertensive heart disease with heart failure: Secondary | ICD-10-CM | POA: Diagnosis present

## 2016-10-17 DIAGNOSIS — Z9889 Other specified postprocedural states: Secondary | ICD-10-CM | POA: Diagnosis not present

## 2016-10-17 DIAGNOSIS — R001 Bradycardia, unspecified: Secondary | ICD-10-CM | POA: Diagnosis present

## 2016-10-17 DIAGNOSIS — Z823 Family history of stroke: Secondary | ICD-10-CM | POA: Diagnosis not present

## 2016-10-17 DIAGNOSIS — Z8673 Personal history of transient ischemic attack (TIA), and cerebral infarction without residual deficits: Secondary | ICD-10-CM

## 2016-10-17 DIAGNOSIS — Z006 Encounter for examination for normal comparison and control in clinical research program: Secondary | ICD-10-CM | POA: Diagnosis not present

## 2016-10-17 DIAGNOSIS — Z7982 Long term (current) use of aspirin: Secondary | ICD-10-CM

## 2016-10-17 DIAGNOSIS — I340 Nonrheumatic mitral (valve) insufficiency: Secondary | ICD-10-CM | POA: Diagnosis not present

## 2016-10-17 DIAGNOSIS — I10 Essential (primary) hypertension: Secondary | ICD-10-CM | POA: Diagnosis present

## 2016-10-17 DIAGNOSIS — I5032 Chronic diastolic (congestive) heart failure: Secondary | ICD-10-CM | POA: Diagnosis present

## 2016-10-17 DIAGNOSIS — Z7902 Long term (current) use of antithrombotics/antiplatelets: Secondary | ICD-10-CM

## 2016-10-17 DIAGNOSIS — Z79899 Other long term (current) drug therapy: Secondary | ICD-10-CM

## 2016-10-17 DIAGNOSIS — Z959 Presence of cardiac and vascular implant and graft, unspecified: Secondary | ICD-10-CM

## 2016-10-17 DIAGNOSIS — I442 Atrioventricular block, complete: Secondary | ICD-10-CM | POA: Diagnosis not present

## 2016-10-17 DIAGNOSIS — R079 Chest pain, unspecified: Secondary | ICD-10-CM | POA: Diagnosis not present

## 2016-10-17 HISTORY — DX: Presence of cardiac pacemaker: Z95.0

## 2016-10-17 HISTORY — DX: Presence of prosthetic heart valve: Z95.2

## 2016-10-17 HISTORY — PX: TRANSCATHETER AORTIC VALVE REPLACEMENT, TRANSFEMORAL: SHX6400

## 2016-10-17 HISTORY — DX: Personal history of transient ischemic attack (TIA), and cerebral infarction without residual deficits: Z86.73

## 2016-10-17 HISTORY — DX: Hyperlipidemia, unspecified: E78.5

## 2016-10-17 HISTORY — PX: TEE WITHOUT CARDIOVERSION: SHX5443

## 2016-10-17 LAB — CBC
HCT: 40.3 % (ref 36.0–46.0)
HEMATOCRIT: 36.1 % (ref 36.0–46.0)
Hemoglobin: 13 g/dL (ref 12.0–15.0)
Hemoglobin: 11.6 g/dL — ABNORMAL LOW (ref 12.0–15.0)
MCH: 30.1 pg (ref 26.0–34.0)
MCH: 29.8 pg (ref 26.0–34.0)
MCHC: 32.3 g/dL (ref 30.0–36.0)
MCHC: 32.1 g/dL (ref 30.0–36.0)
MCV: 93.3 fL (ref 78.0–100.0)
MCV: 92.8 fL (ref 78.0–100.0)
PLATELETS: 133 10*3/uL — AB (ref 150–400)
PLATELETS: 120 10*3/uL — AB (ref 150–400)
RBC: 4.32 MIL/uL (ref 3.87–5.11)
RBC: 3.89 MIL/uL (ref 3.87–5.11)
RDW: 12.9 % (ref 11.5–15.5)
RDW: 13.3 % (ref 11.5–15.5)
WBC: 5.1 10*3/uL (ref 4.0–10.5)
WBC: 4 10*3/uL (ref 4.0–10.5)

## 2016-10-17 LAB — POCT I-STAT 4, (NA,K, GLUC, HGB,HCT)
GLUCOSE: 127 mg/dL — AB (ref 65–99)
HCT: 33 % — ABNORMAL LOW (ref 36.0–46.0)
Hemoglobin: 11.2 g/dL — ABNORMAL LOW (ref 12.0–15.0)
Potassium: 4.1 mmol/L (ref 3.5–5.1)
Sodium: 143 mmol/L (ref 135–145)

## 2016-10-17 LAB — POCT I-STAT 3, ART BLOOD GAS (G3+)
Acid-base deficit: 1 mmol/L (ref 0.0–2.0)
Acid-base deficit: 2 mmol/L (ref 0.0–2.0)
BICARBONATE: 23.6 mmol/L (ref 20.0–28.0)
BICARBONATE: 23.9 mmol/L (ref 20.0–28.0)
Bicarbonate: 23.2 mmol/L (ref 20.0–28.0)
O2 SAT: 95 %
O2 SAT: 98 %
O2 Saturation: 90 %
PCO2 ART: 40.3 mmHg (ref 32.0–48.0)
PCO2 ART: 37.1 mmHg (ref 32.0–48.0)
PO2 ART: 96 mmHg (ref 83.0–108.0)
TCO2: 25 mmol/L (ref 22–32)
TCO2: 24 mmol/L (ref 22–32)
TCO2: 25 mmol/L (ref 22–32)
pCO2 arterial: 38.2 mmHg (ref 32.0–48.0)
pH, Arterial: 7.399 (ref 7.350–7.450)
pH, Arterial: 7.368 (ref 7.350–7.450)
pH, Arterial: 7.418 (ref 7.350–7.450)
pO2, Arterial: 77 mmHg — ABNORMAL LOW (ref 83.0–108.0)
pO2, Arterial: 61 mmHg — ABNORMAL LOW (ref 83.0–108.0)

## 2016-10-17 LAB — COMPREHENSIVE METABOLIC PANEL
ALBUMIN: 4 g/dL (ref 3.5–5.0)
ALT: 16 U/L (ref 14–54)
AST: 22 U/L (ref 15–41)
Alkaline Phosphatase: 61 U/L (ref 38–126)
Anion gap: 9 (ref 5–15)
BUN: 14 mg/dL (ref 6–20)
CHLORIDE: 107 mmol/L (ref 101–111)
CO2: 23 mmol/L (ref 22–32)
Calcium: 9.4 mg/dL (ref 8.9–10.3)
Creatinine, Ser: 0.89 mg/dL (ref 0.44–1.00)
GFR calc Af Amer: >60 mL/min (ref >60)
GFR calc non Af Amer: >60 mL/min (ref >60)
GLUCOSE: 106 mg/dL — AB (ref 65–99)
POTASSIUM: 4.4 mmol/L (ref 3.5–5.1)
Sodium: 139 mmol/L (ref 135–145)
Total Bilirubin: 0.9 mg/dL (ref 0.3–1.2)
Total Protein: 6 g/dL — ABNORMAL LOW (ref 6.5–8.1)

## 2016-10-17 LAB — POCT I-STAT, CHEM 8
BUN: 15 mg/dL (ref 6–20)
BUN: 14 mg/dL (ref 6–20)
BUN: 13 mg/dL (ref 6–20)
CALCIUM ION: 1.25 mmol/L (ref 1.15–1.40)
CHLORIDE: 105 mmol/L (ref 101–111)
Calcium, Ion: 1.23 mmol/L (ref 1.15–1.40)
Calcium, Ion: 1.25 mmol/L (ref 1.15–1.40)
Chloride: 106 mmol/L (ref 101–111)
Chloride: 105 mmol/L (ref 101–111)
Creatinine, Ser: 0.8 mg/dL (ref 0.44–1.00)
Creatinine, Ser: 0.7 mg/dL (ref 0.44–1.00)
Creatinine, Ser: 0.7 mg/dL (ref 0.44–1.00)
GLUCOSE: 111 mg/dL — AB (ref 65–99)
Glucose, Bld: 140 mg/dL — ABNORMAL HIGH (ref 65–99)
Glucose, Bld: 138 mg/dL — ABNORMAL HIGH (ref 65–99)
HCT: 35 % — ABNORMAL LOW (ref 36.0–46.0)
HCT: 32 % — ABNORMAL LOW (ref 36.0–46.0)
HCT: 32 % — ABNORMAL LOW (ref 36.0–46.0)
Hemoglobin: 11.9 g/dL — ABNORMAL LOW (ref 12.0–15.0)
Hemoglobin: 10.9 g/dL — ABNORMAL LOW (ref 12.0–15.0)
Hemoglobin: 10.9 g/dL — ABNORMAL LOW (ref 12.0–15.0)
POTASSIUM: 3.9 mmol/L (ref 3.5–5.1)
Potassium: 4.1 mmol/L (ref 3.5–5.1)
Potassium: 4.2 mmol/L (ref 3.5–5.1)
Sodium: 141 mmol/L (ref 135–145)
Sodium: 140 mmol/L (ref 135–145)
Sodium: 141 mmol/L (ref 135–145)
TCO2: 23 mmol/L (ref 22–32)
TCO2: 25 mmol/L (ref 22–32)
TCO2: 23 mmol/L (ref 22–32)

## 2016-10-17 LAB — TYPE AND SCREEN
ABO/RH(D): O POS
ANTIBODY SCREEN: NEGATIVE

## 2016-10-17 LAB — APTT: APTT: 31 s (ref 24–36)

## 2016-10-17 LAB — GLUCOSE, CAPILLARY: Glucose-Capillary: 90 mg/dL (ref 65–99)

## 2016-10-17 LAB — PROTIME-INR
INR: 1.28
PROTHROMBIN TIME: 15.9 s — AB (ref 11.4–15.2)

## 2016-10-17 SURGERY — IMPLANTATION, AORTIC VALVE, TRANSCATHETER, FEMORAL APPROACH
Anesthesia: General | Site: Chest

## 2016-10-17 MED ORDER — SODIUM CHLORIDE 0.9 % IV SOLN
INTRAVENOUS | Status: DC
  Administered 2016-10-17: 11:00:00 via INTRAVENOUS

## 2016-10-17 MED ORDER — CHLORHEXIDINE GLUCONATE 4 % EX LIQD: 60.0000 mL | Freq: Once | CUTANEOUS | Status: DC

## 2016-10-17 MED ORDER — MIDAZOLAM HCL 5 MG/5ML IJ SOLN
INTRAMUSCULAR | Status: DC | PRN
  Administered 2016-10-17 (×2): 1 mg via INTRAVENOUS

## 2016-10-17 MED ORDER — VITAMIN D (ERGOCALCIFEROL) 1.25 MG (50000 UNIT) PO CAPS
50000.0000 [IU] | ORAL_CAPSULE | ORAL | Status: DC
  Administered 2016-10-21: 50000 [IU] via ORAL
  Filled 2016-10-17: qty 1

## 2016-10-17 MED ORDER — IODIXANOL 320 MG/ML IV SOLN
INTRAVENOUS | Status: DC | PRN
  Administered 2016-10-17: 150 mL via INTRAVENOUS

## 2016-10-17 MED ORDER — ASPIRIN 81 MG PO CHEW: 81.0000 mg | CHEWABLE_TABLET | Freq: Every day | ORAL | Status: DC

## 2016-10-17 MED ORDER — ASPIRIN EC 81 MG PO TBEC
81.0000 mg | DELAYED_RELEASE_TABLET | Freq: Every day | ORAL | Status: DC
  Administered 2016-10-18 – 2016-10-21 (×3): 81 mg via ORAL
  Filled 2016-10-17 (×3): qty 1

## 2016-10-17 MED ORDER — ACETAMINOPHEN 160 MG/5ML PO SOLN: 650.0000 mg | Freq: Once | ORAL | Status: DC

## 2016-10-17 MED ORDER — LACTATED RINGERS IV SOLN: 500.0000 mL | Freq: Once | INTRAVENOUS | Status: DC | PRN

## 2016-10-17 MED ORDER — PRAVASTATIN SODIUM 40 MG PO TABS
40.0000 mg | ORAL_TABLET | Freq: Every evening | ORAL | Status: DC
  Administered 2016-10-17 – 2016-10-20 (×4): 40 mg via ORAL
  Filled 2016-10-17 (×4): qty 1

## 2016-10-17 MED ORDER — ACETAMINOPHEN 650 MG RE SUPP: 650.0000 mg | Freq: Once | RECTAL | Status: DC

## 2016-10-17 MED ORDER — DEXTROSE 5 % IV SOLN
1.5000 g | Freq: Two times a day (BID) | INTRAVENOUS | Status: DC
  Administered 2016-10-17 – 2016-10-18 (×2): 1.5 g via INTRAVENOUS
  Filled 2016-10-17 (×3): qty 1.5

## 2016-10-17 MED ORDER — PROPOFOL 10 MG/ML IV BOLUS
INTRAVENOUS | Status: AC
  Filled 2016-10-17: qty 20

## 2016-10-17 MED ORDER — VANCOMYCIN HCL IN DEXTROSE 1-5 GM/200ML-% IV SOLN
1000.0000 mg | Freq: Once | INTRAVENOUS | Status: AC
  Administered 2016-10-17: 1000 mg via INTRAVENOUS
  Filled 2016-10-17: qty 200

## 2016-10-17 MED ORDER — ACETAMINOPHEN 500 MG PO TABS
1000.0000 mg | ORAL_TABLET | Freq: Four times a day (QID) | ORAL | Status: DC
  Administered 2016-10-18 – 2016-10-20 (×7): 1000 mg via ORAL
  Filled 2016-10-17 (×7): qty 2

## 2016-10-17 MED ORDER — MIDAZOLAM HCL 2 MG/2ML IJ SOLN
INTRAMUSCULAR | Status: AC
  Filled 2016-10-17: qty 2

## 2016-10-17 MED ORDER — FENTANYL CITRATE (PF) 100 MCG/2ML IJ SOLN: 25.0000 ug | INTRAMUSCULAR | Status: DC | PRN

## 2016-10-17 MED ORDER — ALBUMIN HUMAN 5 % IV SOLN
250.0000 mL | INTRAVENOUS | Status: DC | PRN
  Administered 2016-10-17: 250 mL via INTRAVENOUS
  Filled 2016-10-17: qty 250

## 2016-10-17 MED ORDER — LIDOCAINE HCL 1 % IJ SOLN
INTRAMUSCULAR | Status: DC | PRN
  Administered 2016-10-17: 30 mL

## 2016-10-17 MED ORDER — SODIUM CHLORIDE 0.9 % IV SOLN
0.0000 ug/min | INTRAVENOUS | Status: DC
  Filled 2016-10-17: qty 2

## 2016-10-17 MED ORDER — CHLORHEXIDINE GLUCONATE 0.12 % MT SOLN
15.0000 mL | Freq: Once | OROMUCOSAL | Status: AC
Start: 2016-10-18 — End: 2016-10-17
  Administered 2016-10-17: 15 mL via OROMUCOSAL
  Filled 2016-10-17: qty 15

## 2016-10-17 MED ORDER — METOPROLOL TARTRATE 5 MG/5ML IV SOLN: 2.5000 mg | INTRAVENOUS | Status: DC | PRN

## 2016-10-17 MED ORDER — PHENYLEPHRINE HCL 10 MG/ML IJ SOLN
INTRAMUSCULAR | Status: DC | PRN
  Administered 2016-10-17: 40 ug/min via INTRAVENOUS
  Administered 2016-10-17: 15 ug/min via INTRAVENOUS

## 2016-10-17 MED ORDER — HEPARIN SODIUM (PORCINE) 1000 UNIT/ML IJ SOLN
INTRAMUSCULAR | Status: DC | PRN
  Administered 2016-10-17: 12000 [IU] via INTRAVENOUS

## 2016-10-17 MED ORDER — ONDANSETRON HCL 4 MG/2ML IJ SOLN: 4.0000 mg | Freq: Four times a day (QID) | INTRAMUSCULAR | Status: DC | PRN

## 2016-10-17 MED ORDER — ONDANSETRON HCL 4 MG/2ML IJ SOLN
INTRAMUSCULAR | Status: AC
  Filled 2016-10-17: qty 2

## 2016-10-17 MED ORDER — LIDOCAINE HCL (PF) 1 % IJ SOLN
INTRAMUSCULAR | Status: AC
  Filled 2016-10-17: qty 30

## 2016-10-17 MED ORDER — FENTANYL CITRATE (PF) 100 MCG/2ML IJ SOLN
INTRAMUSCULAR | Status: DC | PRN
  Administered 2016-10-17 (×2): 50 ug via INTRAVENOUS

## 2016-10-17 MED ORDER — CLOPIDOGREL BISULFATE 75 MG PO TABS
75.0000 mg | ORAL_TABLET | Freq: Every day | ORAL | Status: DC
  Administered 2016-10-18 – 2016-10-21 (×4): 75 mg via ORAL
  Filled 2016-10-17 (×4): qty 1

## 2016-10-17 MED ORDER — ACETAMINOPHEN 160 MG/5ML PO SOLN: 1000.0000 mg | Freq: Four times a day (QID) | ORAL | Status: DC

## 2016-10-17 MED ORDER — SODIUM CHLORIDE 0.9 % IV SOLN
INTRAVENOUS | Status: DC | PRN
  Administered 2016-10-17: 500 mL

## 2016-10-17 MED ORDER — PROTAMINE SULFATE 10 MG/ML IV SOLN
INTRAVENOUS | Status: AC
  Filled 2016-10-17: qty 25

## 2016-10-17 MED ORDER — 0.9 % SODIUM CHLORIDE (POUR BTL) OPTIME
TOPICAL | Status: DC | PRN
  Administered 2016-10-17: 1000 mL

## 2016-10-17 MED ORDER — TRAMADOL HCL 50 MG PO TABS: 50.0000 mg | ORAL_TABLET | ORAL | Status: DC | PRN

## 2016-10-17 MED ORDER — LACTATED RINGERS IV SOLN
INTRAVENOUS | Status: DC | PRN
  Administered 2016-10-17 (×2): via INTRAVENOUS

## 2016-10-17 MED ORDER — SODIUM CHLORIDE 0.9 % IV SOLN: INTRAVENOUS | Status: DC

## 2016-10-17 MED ORDER — HEPARIN SODIUM (PORCINE) 1000 UNIT/ML IJ SOLN
INTRAMUSCULAR | Status: AC
  Filled 2016-10-17: qty 1

## 2016-10-17 MED ORDER — PANTOPRAZOLE SODIUM 40 MG PO TBEC
40.0000 mg | DELAYED_RELEASE_TABLET | Freq: Every day | ORAL | Status: DC
  Administered 2016-10-19 – 2016-10-21 (×2): 40 mg via ORAL
  Filled 2016-10-17 (×2): qty 1

## 2016-10-17 MED ORDER — PROTAMINE SULFATE 10 MG/ML IV SOLN
INTRAVENOUS | Status: DC | PRN
  Administered 2016-10-17: 120 mg via INTRAVENOUS

## 2016-10-17 MED ORDER — LIDOCAINE 2% (20 MG/ML) 5 ML SYRINGE
INTRAMUSCULAR | Status: DC | PRN
  Administered 2016-10-17: 50 mg via INTRAVENOUS

## 2016-10-17 MED ORDER — CHLORHEXIDINE GLUCONATE 4 % EX LIQD: 30.0000 mL | CUTANEOUS | Status: DC

## 2016-10-17 MED ORDER — NITROGLYCERIN IN D5W 200-5 MCG/ML-% IV SOLN: 0.0000 ug/min | INTRAVENOUS | Status: DC

## 2016-10-17 MED ORDER — FENTANYL CITRATE (PF) 250 MCG/5ML IJ SOLN
INTRAMUSCULAR | Status: AC
  Filled 2016-10-17: qty 5

## 2016-10-17 MED ORDER — ONDANSETRON HCL 4 MG/2ML IJ SOLN
INTRAMUSCULAR | Status: DC | PRN
  Administered 2016-10-17: 4 mg via INTRAVENOUS

## 2016-10-17 SURGICAL SUPPLY — 112 items
ADAPTER UNIV SWAN GANZ BIP (ADAPTER) ×2 IMPLANT
ADAPTER UNV SWAN GANZ BIP (ADAPTER) ×1
ATTRACTOMAT 16X20 MAGNETIC DRP (DRAPES) IMPLANT
BAG BANDED W/RUBBER/TAPE 36X54 (MISCELLANEOUS) ×3 IMPLANT
BAG DECANTER FOR FLEXI CONT (MISCELLANEOUS) ×3 IMPLANT
BAG SNAP BAND KOVER 36X36 (MISCELLANEOUS) ×6 IMPLANT
BALLN TRUE 18X4.5 (BALLOONS) ×3
BALLOON TRUE 18X4.5 (BALLOONS) ×2 IMPLANT
BLADE 10 SAFETY STRL DISP (BLADE) ×3 IMPLANT
BLADE CLIPPER SURG (BLADE) IMPLANT
BLADE STERNUM SYSTEM 6 (BLADE) ×3 IMPLANT
CABLE ADAPT CONN TEMP 6FT (ADAPTER) ×3 IMPLANT
CABLE PACING FASLOC BIEGE (MISCELLANEOUS) ×3 IMPLANT
CABLE PACING FASLOC BLUE (MISCELLANEOUS) ×3 IMPLANT
CANISTER SUCT 3000ML PPV (MISCELLANEOUS) IMPLANT
CANNULA FEM VENOUS REMOTE 22FR (CANNULA) IMPLANT
CANNULA OPTISITE PERFUSION 16F (CANNULA) IMPLANT
CANNULA OPTISITE PERFUSION 18F (CANNULA) IMPLANT
CATH DIAG EXPO 6F VENT PIG 145 (CATHETERS) ×6 IMPLANT
CATH EXPO 5FR AL1 (CATHETERS) ×3 IMPLANT
CATH S G BIP PACING (SET/KITS/TRAYS/PACK) ×6 IMPLANT
CLIP VESOCCLUDE MED 24/CT (CLIP) ×3 IMPLANT
CLIP VESOCCLUDE SM WIDE 24/CT (CLIP) ×3 IMPLANT
CONT SPEC 4OZ CLIKSEAL STRL BL (MISCELLANEOUS) ×6 IMPLANT
COVER BACK TABLE 60X90IN (DRAPES) ×3 IMPLANT
COVER BACK TABLE 80X110 HD (DRAPES) ×3 IMPLANT
COVER DOME SNAP 22 D (MISCELLANEOUS) ×3 IMPLANT
COVER MAYO STAND STRL (DRAPES) ×3 IMPLANT
COVER PROBE W GEL 5X96 (DRAPES) ×3 IMPLANT
CRADLE DONUT ADULT HEAD (MISCELLANEOUS) ×3 IMPLANT
DERMABOND ADVANCED (GAUZE/BANDAGES/DRESSINGS) ×2
DERMABOND ADVANCED .7 DNX12 (GAUZE/BANDAGES/DRESSINGS) ×4 IMPLANT
DEVICE CLOSURE PERCLS PRGLD 6F (VASCULAR PRODUCTS) ×4 IMPLANT
DRAPE INCISE IOBAN 66X45 STRL (DRAPES) IMPLANT
DRAPE SLUSH MACHINE 52X66 (DRAPES) ×3 IMPLANT
DRSG TEGADERM 4X4.75 (GAUZE/BANDAGES/DRESSINGS) ×6 IMPLANT
DRYSEAL FLEXSHEATH 20FR 33CM (SHEATH) ×1
ELECT REM PT RETURN 9FT ADLT (ELECTROSURGICAL) ×6
ELECTRODE REM PT RTRN 9FT ADLT (ELECTROSURGICAL) ×4 IMPLANT
FELT TEFLON 6X6 (MISCELLANEOUS) ×3 IMPLANT
FEMORAL VENOUS CANN RAP (CANNULA) IMPLANT
GAUZE SPONGE 4X4 12PLY STRL (GAUZE/BANDAGES/DRESSINGS) ×3 IMPLANT
GAUZE SPONGE 4X4 12PLY STRL LF (GAUZE/BANDAGES/DRESSINGS) ×6 IMPLANT
GLOVE BIO SURGEON STRL SZ7.5 (GLOVE) ×3 IMPLANT
GLOVE BIO SURGEON STRL SZ8 (GLOVE) ×6 IMPLANT
GLOVE EUDERMIC 7 POWDERFREE (GLOVE) ×3 IMPLANT
GLOVE ORTHO TXT STRL SZ7.5 (GLOVE) ×3 IMPLANT
GOWN STRL REUS W/ TWL LRG LVL3 (GOWN DISPOSABLE) ×6 IMPLANT
GOWN STRL REUS W/ TWL XL LVL3 (GOWN DISPOSABLE) ×12 IMPLANT
GOWN STRL REUS W/TWL LRG LVL3 (GOWN DISPOSABLE) ×3
GOWN STRL REUS W/TWL XL LVL3 (GOWN DISPOSABLE) ×6
GUIDEWIRE SAF TJ AMPL .035X180 (WIRE) ×3 IMPLANT
GUIDEWIRE SAFE TJ AMPLATZ EXST (WIRE) ×3 IMPLANT
GUIDEWIRE STRAIGHT .035 260CM (WIRE) ×3 IMPLANT
INSERT FOGARTY 61MM (MISCELLANEOUS) ×3 IMPLANT
INSERT FOGARTY SM (MISCELLANEOUS) IMPLANT
INSERT FOGARTY XLG (MISCELLANEOUS) IMPLANT
KIT BASIN OR (CUSTOM PROCEDURE TRAY) ×3 IMPLANT
KIT DILATOR VASC 18G NDL (KITS) ×3 IMPLANT
KIT HEART LEFT (KITS) ×3 IMPLANT
KIT ROOM TURNOVER OR (KITS) ×3 IMPLANT
KIT SUCTION CATH 14FR (SUCTIONS) ×6 IMPLANT
NEEDLE 22X1 1/2 (OR ONLY) (NEEDLE) ×3 IMPLANT
NEEDLE PERC 18GX7CM (NEEDLE) ×3 IMPLANT
NS IRRIG 1000ML POUR BTL (IV SOLUTION) ×9 IMPLANT
PACK AORTA (CUSTOM PROCEDURE TRAY) ×3 IMPLANT
PAD ARMBOARD 7.5X6 YLW CONV (MISCELLANEOUS) ×6 IMPLANT
PAD ELECT DEFIB RADIOL ZOLL (MISCELLANEOUS) ×3 IMPLANT
PATCH TACHOSII LRG 9.5X4.8 (VASCULAR PRODUCTS) IMPLANT
PERCLOSE PROGLIDE 6F (VASCULAR PRODUCTS) ×6
PINNACLE 4FR ×3 IMPLANT
SET MICROPUNCTURE 5F STIFF (MISCELLANEOUS) ×3 IMPLANT
SHEATH AVANTI 11CM 8FR (MISCELLANEOUS) ×3 IMPLANT
SHEATH DRYSEAL FLEX 20FR 33CM (SHEATH) ×2 IMPLANT
SHEATH PINNACLE 6F 10CM (SHEATH) ×6 IMPLANT
SLEEVE REPOSITIONING LENGTH 30 (MISCELLANEOUS) ×3 IMPLANT
SPONGE LAP 4X18 X RAY DECT (DISPOSABLE) ×3 IMPLANT
STOPCOCK MORSE 400PSI 3WAY (MISCELLANEOUS) ×24 IMPLANT
SUT ETHIBOND X763 2 0 SH 1 (SUTURE) IMPLANT
SUT GORETEX CV 4 TH 22 36 (SUTURE) IMPLANT
SUT GORETEX CV4 TH-18 (SUTURE) IMPLANT
SUT GORETEX TH-18 36 INCH (SUTURE) IMPLANT
SUT MNCRL AB 3-0 PS2 18 (SUTURE) IMPLANT
SUT PROLENE 3 0 SH1 36 (SUTURE) IMPLANT
SUT PROLENE 4 0 RB 1 (SUTURE)
SUT PROLENE 4-0 RB1 .5 CRCL 36 (SUTURE) IMPLANT
SUT PROLENE 5 0 C 1 36 (SUTURE) IMPLANT
SUT PROLENE 6 0 C 1 30 (SUTURE) IMPLANT
SUT SILK  1 MH (SUTURE) ×1
SUT SILK 1 MH (SUTURE) ×2 IMPLANT
SUT SILK 2 0 SH CR/8 (SUTURE) IMPLANT
SUT VIC AB 2-0 CT1 27 (SUTURE)
SUT VIC AB 2-0 CT1 TAPERPNT 27 (SUTURE) IMPLANT
SUT VIC AB 2-0 CTX 36 (SUTURE) IMPLANT
SUT VIC AB 3-0 SH 8-18 (SUTURE) IMPLANT
SYR 10ML LL (SYRINGE) ×9 IMPLANT
SYR 30ML LL (SYRINGE) ×6 IMPLANT
SYR 50ML LL SCALE MARK (SYRINGE) ×3 IMPLANT
SYR CONTROL 10ML LL (SYRINGE) ×3 IMPLANT
SYS DELIVERY ENVEO PRO 16FR (MISCELLANEOUS) ×3
SYS LOAD ENVEOPRO 23MM EVOLUT (MISCELLANEOUS) ×3
SYSTEM DELIVERY ENVEO PRO 16FR (MISCELLANEOUS) ×2 IMPLANT
SYSTEM LOAD ENVPR 23MM EVOLUT (MISCELLANEOUS) ×2 IMPLANT
TOWEL OR 17X26 10 PK STRL BLUE (TOWEL DISPOSABLE) ×6 IMPLANT
TRANSDUCER W/STOPCOCK (MISCELLANEOUS) ×6 IMPLANT
TRAY FOLEY SILVER 14FR TEMP (SET/KITS/TRAYS/PACK) ×3 IMPLANT
TUBE SUCT INTRACARD DLP 20F (MISCELLANEOUS) IMPLANT
TUBING HIGH PRESSURE 120CM (CONNECTOR) ×3 IMPLANT
VALVE AORTIC EVOLUT PRO 23MM (Prosthesis & Implant Heart) ×3 IMPLANT
WIRE AMPLATZ SS-J .035X180CM (WIRE) ×3 IMPLANT
WIRE AMPLATZ SS-J .035X260CM (WIRE) ×3 IMPLANT
WIRE BENTSON .035X145CM (WIRE) ×3 IMPLANT

## 2016-10-17 NOTE — Anesthesia Preprocedure Evaluation (Addendum)
Anesthesia Evaluation  Patient identified by MRN, date of birth, ID band Patient awake    Reviewed: Allergy & Precautions, NPO status , Patient's Chart, lab work & pertinent test results  History of Anesthesia Complications (+) PSEUDOCHOLINESTERASE DEFICIENCY  Airway Mallampati: II  TM Distance: >3 FB     Dental   Pulmonary neg pulmonary ROS,    breath sounds clear to auscultation       Cardiovascular hypertension,  Rhythm:Regular Rate:Normal     Neuro/Psych    GI/Hepatic negative GI ROS, Neg liver ROS,   Endo/Other  negative endocrine ROS  Renal/GU negative Renal ROS     Musculoskeletal   Abdominal   Peds  Hematology   Anesthesia Other Findings   Reproductive/Obstetrics                            Anesthesia Physical Anesthesia Plan  ASA: III  Anesthesia Plan: General   Post-op Pain Management:    Induction: Intravenous  PONV Risk Score and Plan: 3 and Ondansetron, Dexamethasone, Midazolam, Propofol infusion and Treatment may vary due to age or medical condition  Airway Management Planned: Oral ETT  Additional Equipment:   Intra-op Plan:   Post-operative Plan: Extubation in OR  Informed Consent: I have reviewed the patients History and Physical, chart, labs and discussed the procedure including the risks, benefits and alternatives for the proposed anesthesia with the patient or authorized representative who has indicated his/her understanding and acceptance.   Dental advisory given  Plan Discussed with: CRNA and Anesthesiologist  Anesthesia Plan Comments:         Anesthesia Quick Evaluation

## 2016-10-17 NOTE — Progress Notes (Signed)
  Echocardiogram 2D Echocardiogram has been performed.  Robin Sutton 10/17/2016, 9:48 AM

## 2016-10-17 NOTE — H&P (View-Only) (Signed)
Patient ID: Robin Sutton, female   DOB: October 14, 1935, 81 y.o.   MRN: [id number]  [place] [facility] CONSULTATION REPORT  Referring Provider is Sherren Mocha, MD PCP is Maylon Cos, NP Primary Cardiologist is Jenne Campus, MD  Chief Complaint  Patient presents with  . Aortic Stenosis    2nd TAVR eval...for scheduled 10/09/14 surgery...DENTAL COMPLETE.Marland KitchenON ANTX    HPI:  The patient is a 81 year old woman with hypertension, remote stroke without residual deficit, aortic stenosis and chronic diastolic heart failure. Over the past several months she has developed exertional shortness of breath and fatigue. Her daughter is with her today and has noticed more than the patient has. She says that she has no energy and although she is still independent she can't do much activity. She was seen by her PCP and noted to have a prominent heart murmur and was referred to Dr. Agustin Cree. She had an echo showing severe aortic stenosis with a mean gradient of 47 mm Hg and a peak of 81 mm Hg. There was moderate AI. Cardiac cath showed mild to moderate difffuse non-obstructive CAD with a mean gradient of 25 mm Hg and a peak of 27 mm Hg with a calculated AVA of 1.3 cm2.   Past Medical History:  Diagnosis Date  . Hypertension   . Stroke [facility])     Past Surgical History:  Procedure Laterality Date  . BREAST BIOPSY    . RIGHT/LEFT HEART CATH AND CORONARY ANGIOGRAPHY N/A 09/11/2016   Procedure: RIGHT/LEFT HEART CATH AND CORONARY ANGIOGRAPHY;  Surgeon: Sherren Mocha, MD;  Location: [place] [facility];  Service: Cardiovascular;  Laterality: N/A;  . TONSILLECTOMY      Family History  Problem Relation Age of Onset  . Hypertension Mother   . Heart disease Mother   . Stroke Mother   . Kidney failure Father   . Hypertension Brother   . Heart disease Brother   . Heart disease Maternal Grandfather   . Diabetes Maternal Grandfather      Social History   Social History  . Marital status: Widowed    Spouse name: N/A  . Number of children: N/A  . Years of education: N/A   Occupational History  . Not on file.   Social History Main Topics  . Smoking status: Never Smoker  . Smokeless tobacco: Never Used  . Alcohol use No  . Drug use: No  . Sexual activity: Not on file   Other Topics Concern  . Not on file   Social History Narrative  . No narrative on file    Current Outpatient Prescriptions  Medication Sig Dispense Refill  . AMOXICILLIN PO Take 1 tablet by mouth 3 (three) times daily. X 10 days    . aspirin 325 MG tablet Take 325 mg by mouth daily.    Marland Kitchen lisinopril (PRINIVIL,ZESTRIL) 10 MG tablet Take 10 mg by mouth every evening.   1  . polyethylene glycol powder (GLYCOLAX/MIRALAX) powder Take 17 g by mouth as needed for constipation.    . pravastatin (PRAVACHOL) 40 MG tablet Take 40 mg by mouth every evening.   1  . Vitamin D, Ergocalciferol, (DRISDOL) 50000 units CAPS capsule Take 50,000 Units by mouth every Monday.   1   No current facility-administered medications for this visit.    Facility-Administered Medications Ordered in Other Visits  Medication Dose Route Frequency Provider Last Rate Last Dose  . chlorhexidine (HIBICLENS) 4 %  liquid 4 application  60 mL Topical Once Sherren Mocha, MD       And  . Derrill Memo ON 10/03/2016] chlorhexidine (HIBICLENS) 4 % liquid 4 application  60 mL Topical Once Sherren Mocha, MD        Allergies  Allergen Reactions  . Adhesive [Tape] Dermatitis    Skin Irritation      Review of Systems:  General:                      normal appetite, decreased energy, no weight gain, no weight loss, no fever             Cardiac:                       no chest pain with exertion, no chest pain at rest, +SOB with exertion, no resting SOB, no PND, + orthopnea, no palpitations, no arrhythmia, no atrial fibrillation, + LE edema, no dizzy spells, no syncope              Respiratory:                 + exertional shortness of breath, no home oxygen, no productive cough, no dry cough, no bronchitis, no wheezing, no hemoptysis, no asthma, no pain with inspiration or cough, no sleep apnea, no CPAP at night             GI:                               no difficulty swallowing, no reflux, no frequent heartburn, no hiatal hernia, no abdominal pain, occasional constipation, no diarrhea, no hematochezia, no hematemesis, no melena             GU:                              no dysuria,  no frequency, no urinary tract infection, no hematuria, no kidney stones, no kidney disease             Vascular:                     no pain suggestive of claudication, no pain in feet, no leg cramps, + varicose veins, no DVT, no non-healing foot ulcer             Neuro:                         + stroke, no TIA's, no seizures, no headaches, no temporary blindness one eye,  no slurred speech, no peripheral neuropathy, no chronic pain, very mild instability of gait, no memory/cognitive dysfunction             Musculoskeletal:         mild arthritis, no joint swelling, no myalgias, no difficulty walking, normal mobility              Skin:                            no rash, no itching, no skin infections, no pressure sores or ulcerations             Psych:  no anxiety, no depression, no nervousness, no unusual recent stress             Eyes:                           no blurry vision, no floaters, no recent vision changes, + wears glasses or contacts             ENT:                            no hearing loss, no loose or painful teeth, upper dentures, just had the remainder of her teeth extracted earlier this week.             Hematologic:               no easy bruising, no abnormal bleeding, no clotting disorder, no frequent epistaxis             Endocrine:                   no diabetes, does not check CBG's at home                             Physical Exam:   BP  122/65 (BP Location: Right Arm, Patient Position: Sitting, Cuff Size: Large)   Pulse 65   Resp 16   Ht _0  (1.6 m)   Wt 183 lb (83 kg)   LMP  (Exact Date)   SpO2 96% Comment: ON RA  BMI 32.42 kg/m   General:  Elderly but  well-appearing  HEENT:  [place]/AT, PERLA, EOMI, lower gums swollen from recent extractions, some ecchymosis of jaw from extractions.  Neck:   no JVD, no bruits, no adenopathy or thyromegaly  Chest:   clear to auscultation, symmetrical breath sounds, no wheezes, no rhonchi   CV:   RRR, grade III/VI crescendo/decrescendo murmur heard best at RSB,  no diastolic murmur  Abdomen:  soft, non-tender, no masses or organomegaly  Extremities:  warm, well-perfused, pulses palpable in feet, no LE edema  Rectal/GU  Deferred  Neuro:   Grossly non-focal and symmetrical throughout  Skin:   Clean and dry, no rashes, no breakdown   Diagnostic Tests:    [facility]                       [place], [place] [address]                            [phone]  ------------------------------------------------------------------- Transthoracic Echocardiography  Patient:    Ayari, Liwanag MR #:       [id number] Study Date: 07/18/2016 Gender:     F Age:        11 Height:     160 cm Weight:     84.8 kg BSA:        1.98 m^2 Pt. Status: Room:   ATTENDING    Sherryl Barters     Krasowski, [place], Inpatient  cc:  ------------------------------------------------------------------- LV  EF: 65% -   70%  ------------------------------------------------------------------- Indications:      Aortic stenosis 424.1.  ------------------------------------------------------------------- History:   PMH:  Increased shortness of breath.  Risk factors: Hypertension.  Dyslipidemia.  ------------------------------------------------------------------- Study Conclusions  - Left ventricle: The cavity size was normal. Systolic function was   vigorous. The estimated ejection fraction was in the range of 65%   to 70%. Doppler parameters are consistent with abnormal left   ventricular relaxation (grade 1 diastolic dysfunction). - Aortic valve: AV is thickened, calcified with restricted motion.   Peak and mean gradients through the valve are 81 and 47 mm Hg   respectively consistent with sevrere AS. There was moderate   regurgitation.  ------------------------------------------------------------------- Study data:  No prior study was available for comparison.  Study status:  Routine.  Procedure:  The patient reported no pain pre or post test. Transthoracic echocardiography. Image quality was adequate.  Study completion:  There were no complications. Transthoracic echocardiography.  M-mode, complete 2D, spectral Doppler, and color Doppler.  Birthdate:  Patient birthdate: 11/29/1935.  Age:  Patient is 81 yr old.  Sex:  Gender: female. BMI: 33.1 kg/m^2.  Blood pressure:     120/64  Patient status: Outpatient.  Study date:  Study date: 07/18/2016. Study time: 01:18 PM.  Location:  Echo laboratory.  -------------------------------------------------------------------  ------------------------------------------------------------------- Left ventricle:  The cavity size was normal. Systolic function was vigorous. The estimated ejection fraction was in the range of 65% to 70%. Doppler parameters are consistent with abnormal left ventricular relaxation (grade 1 diastolic dysfunction).  ------------------------------------------------------------------- Aortic valve:  AV is thickened, calcified with restricted motion. Peak and mean gradients through the valve are 81 and 47 mm Hg respectively consistent with sevrere AS.  Doppler:  There was moderate  regurgitation.    VTI ratio of LVOT to aortic valve: 0.37. Valve area (VTI): 0.94 cm^2. Indexed valve area (VTI): 0.48 cm^2/m^2. Peak velocity ratio of LVOT to aortic valve: 0.41. Valve area (Vmax): 1.05 cm^2. Indexed valve area (Vmax): 0.53 cm^2/m^2. Mean velocity ratio of LVOT to aortic valve: 0.35. Valve area (Vmean): 0.9 cm^2. Indexed valve area (Vmean): 0.45 cm^2/m^2. Mean gradient (S): 43 mm Hg. Peak gradient (S): 76 mm Hg.  ------------------------------------------------------------------- Mitral valve:   Mildly thickened leaflets .  Doppler:  There was trivial regurgitation.    Peak gradient (D): 4 mm Hg.  ------------------------------------------------------------------- Left atrium:  The atrium was normal in size.  ------------------------------------------------------------------- Right ventricle:  The cavity size was normal. Wall thickness was normal. Systolic function was normal.  ------------------------------------------------------------------- Pulmonic valve:    Structurally normal valve.   Cusp separation was normal.  Doppler:  Transvalvular velocity was within the normal range. There was trivial regurgitation.  ------------------------------------------------------------------- Tricuspid valve:   Structurally normal valve.   Leaflet separation was normal.  Doppler:  Transvalvular velocity was within the normal range. There was no regurgitation.  ------------------------------------------------------------------- Right atrium:  The atrium was normal in size.  ------------------------------------------------------------------- Measurements   Left ventricle                           Value          Reference  LV ID, ED, PLAX chordal          (L)     40.4  mm       43 - 52  LV ID, ES, PLAX chordal  26.5  mm       23 - 38  LV fx shortening, PLAX chordal           34    %        >=29  LV PW thickness, ED                      11.5  mm        ----------  IVS/LV PW ratio, ED                      0.91           <=1.3  Stroke volume, 2D                        94    ml       ----------  Stroke volume/bsa, 2D                    48    ml/m^2   ----------  LV e&', lateral                           6.74  cm/s     ----------  LV E/e&', lateral                         14.11          ----------  LV e&', medial                            6.64  cm/s     ----------  LV E/e&', medial                          14.32          ----------  LV e&', average                           6.69  cm/s     ----------  LV E/e&', average                         14.22          ----------    Ventricular septum                       Value          Reference  IVS thickness, ED                        10.5  mm       ----------    LVOT                                     Value          Reference  LVOT ID, S                               18    mm       ----------  LVOT area  2.54  cm^2     ----------  LVOT peak velocity, S                    180   cm/s     ----------  LVOT mean velocity, S                    109   cm/s     ----------  LVOT VTI, S                              37    cm       ----------  LVOT peak gradient, S                    13    mm Hg    ----------    Aortic valve                             Value          Reference  Aortic valve peak velocity, S            435   cm/s     ----------  Aortic valve mean velocity, S            309   cm/s     ----------  Aortic valve VTI, S                      99.7  cm       ----------  Aortic mean gradient, S                  43    mm Hg    ----------  Aortic peak gradient, S                  76    mm Hg    ----------  VTI ratio, LVOT/AV                       0.37           ----------  Aortic valve area, VTI                   0.94  cm^2     ----------  Aortic valve area/bsa, VTI               0.48  cm^2/m^2 ----------  Velocity ratio, peak, LVOT/AV            0.41            ----------  Aortic valve area, peak velocity         1.05  cm^2     ----------  Aortic valve area/bsa, peak              0.53  cm^2/m^2 ----------  velocity  Velocity ratio, mean, LVOT/AV            0.35           ----------  Aortic valve area, mean velocity         0.9   cm^2     ----------  Aortic valve area/bsa, mean              0.45  cm^2/m^2 ----------  velocity  Aortic regurg pressure half-time  493   ms       ----------    Aorta                                    Value          Reference  Aortic root ID, ED                       29    mm       ----------    Left atrium                              Value          Reference  LA ID, A-P, ES                           44    mm       ----------  LA ID/bsa, A-P                   (H)     2.23  cm/m^2   <=2.2  LA volume, S                             84.1  ml       ----------  LA volume/bsa, S                         42.6  ml/m^2   ----------  LA volume, ES, 1-p A4C                   79.5  ml       ----------  LA volume/bsa, ES, 1-p A4C               40.2  ml/m^2   ----------  LA volume, ES, 1-p A2C                   81.2  ml       ----------  LA volume/bsa, ES, 1-p A2C               41.1  ml/m^2   ----------    Mitral valve                             Value          Reference  Mitral E-wave peak velocity              95.1  cm/s     ----------  Mitral A-wave peak velocity              123   cm/s     ----------  Mitral deceleration time         (H)     320   ms       150 - 230  Mitral peak gradient, D                  4     mm Hg    ----------  Mitral E/A ratio, peak  0.8            ----------    Pulmonary arteries                       Value          Reference  PA pressure, S, DP                       27    mm Hg    <=30    Tricuspid valve                          Value          Reference  Tricuspid regurg peak velocity           246   cm/s     ----------  Tricuspid peak RV-RA gradient            24    mm Hg     ----------    Right atrium                             Value          Reference  RA ID, S-I, ES, A4C              (H)     50.9  mm       34 - 49  RA area, ES, A4C                 (H)     20.8  cm^2     8.3 - 19.5  RA volume, ES, A/L                       64.6  ml       ----------  RA volume/bsa, ES, A/L                   32.7  ml/m^2   ----------    Systemic veins                           Value          Reference  Estimated CVP                            3     mm Hg    ----------    Right ventricle                          Value          Reference  RV ID, minor axis, ED, A4C base          38.4  mm       ----------  TAPSE                                    29.1  mm       ----------  RV pressure, S, DP                       27    mm Hg    <=30  RV s&',  lateral, S                        15.4  cm/s     ----------  Legend: (L)  and  (H)  mark values outside specified reference range.  ------------------------------------------------------------------- Prepared and Electronically Authenticated by  Dorris Carnes, M.D. 2018-07-12T14:36:38   Physicians   Panel Physicians Referring Physician Case Authorizing Physician  Sherren Mocha, MD (Primary)    Procedures   RIGHT/LEFT HEART CATH AND CORONARY ANGIOGRAPHY  Conclusion   1. Mild-moderate diffuse nonobstructive CAD (left dominant) 2. Moderate aortic stenosis by cath data with a mean transvalvular gradient of 25 mmHg and AVA 1.3 square cm 3. Normal right heart hemodynamics  Plan: echo data reviewed and is diagnostic of severe aortic stenosis with a peak transaortic velocity of 4.5 m/s and mean gradient 47 mmHg. Will refer to cardiac surgery for multidisciplinary valve team evaluation.   Indications   Severe aortic stenosis [I35.0 (ICD-10-CM)]  Procedural Details/Technique   Technical Details INDICATION: Severe symptomatic aortic stenosis  PROCEDURAL DETAILS: There was an indwelling IV in a right antecubital vein. Using  normal sterile technique, the IV was changed out for a 5 Fr brachial sheath over a 0.018 inch wire. The right wrist was then prepped, draped, and anesthetized with 1% lidocaine. Using the modified Seldinger technique a 5/6 French Slender sheath was placed in the right radial artery. Intra-arterial verapamil was administered through the radial artery sheath. IV heparin was administered after a JR4 catheter was advanced into the central aorta. A Swan-Ganz catheter was used for the right heart catheterization. Standard protocol was followed for recording of right heart pressures and sampling of oxygen saturations. Fick cardiac output was calculated. Standard Judkins catheters were used for selective coronary angiography. The right subclavian was very tortuous and catheter manipulation was difficult. The RCA was selectively engaged with a 5 Fr Easy-Rad guide catheter. The LCA was engaged with a JR 3.5 diagnostic catheter. The aortic valve was crossed with a JR4 catheter directing a J-wire into the LV. Pullback pressure was recorded. There were no immediate procedural complications. The patient was transferred to the post catheterization recovery area for further monitoring.     Estimated blood loss <50 mL.  During this procedure the patient was administered the following to achieve and maintain moderate conscious sedation: Versed 1 mg, Fentanyl 25 mcg, while the patient's heart rate, blood pressure, and oxygen saturation were continuously monitored. The period of conscious sedation was 39 minutes, of which I was present face-to-face 100% of this time.    Coronary Findings   Dominance: Left  Left Anterior Descending  Prox LAD lesion, 40% stenosed. The lesion is eccentric. The lesion is calcified. Nonobstructive proximal LAD lesion.  Left Circumflex  Mid Cx lesion, 30% stenosed.  First Obtuse Marginal Branch  1st Mrg lesion, 40% stenosed. The lesion is calcified.  Right Coronary Artery  Vessel is small.   Left Heart   Aortic Valve There is moderate aortic valve stenosis. The aortic valve is calcified. There is restricted aortic valve motion. Mean gradient 25 mmHg, AVA 1.3 square cm    Coronary Diagrams   Diagnostic Diagram       Implants     No implant documentation for this case.  PACS Images   Show images for CARDIAC CATHETERIZATION   Link to Procedure Log   Procedure Log    Hemo Data    Most Recent Value  Fick Cardiac Output 5.54 L/min  Fick Cardiac Output Index 2.96 (L/min)/BSA  Aortic Mean Gradient 25 mmHg  Aortic Peak Gradient 27 mmHg  Aortic Valve Area 1.30  Aortic Value Area Index 0.7 cm2/BSA  RA A Wave 12 mmHg  RA V Wave 9 mmHg  RA Mean 8 mmHg  RV Systolic Pressure 32 mmHg  RV Diastolic Pressure 5 mmHg  RV EDP 12 mmHg  PA Systolic Pressure 28 mmHg  PA Diastolic Pressure 10 mmHg  PA Mean 19 mmHg  PW A Wave 18 mmHg  PW V Wave 15 mmHg  PW Mean 14 mmHg  AO Systolic Pressure 974 mmHg  AO Diastolic Pressure 59 mmHg  AO Mean 84 mmHg  LV Systolic Pressure 163 mmHg  LV Diastolic Pressure 9 mmHg  LV EDP 17 mmHg  Arterial Occlusion Pressure Extended Systolic Pressure 845 mmHg  Arterial Occlusion Pressure Extended Diastolic Pressure 60 mmHg  Arterial Occlusion Pressure Extended Mean Pressure 87 mmHg  Left Ventricular Apex Extended Systolic Pressure 364 mmHg  Left Ventricular Apex Extended Diastolic Pressure 8 mmHg  Left Ventricular Apex Extended EDP Pressure 19 mmHg  QP/QS 1  TPVR Index 6.41 HRUI  TSVR Index 28.35 HRUI  PVR SVR Ratio 0.07  TPVR/TSVR Ratio 0.23    CT CORONARY MORPH W/CTA COR W/SCORE W/CA W/CM &/OR WO/CM (Accession [id number]) (Order [id number])  Imaging  Date: 09/19/2016 Department: [facility] CT IMAGING Released By: Reggy Eye Authorizing: Sherren Mocha, MD  Exam Information   Status Exam Begun  Exam Ended   Final [99] 09/19/2016 3:10 PM 09/19/2016 3:57 PM  PACS Images   Show images for CT CORONARY MORPH  W/CTA COR W/SCORE W/CA W/CM &/OR WO/CM  Addendum   ADDENDUM REPORT: 09/19/2016 17:38  CLINICAL DATA:  Aortic stenosis  EXAM: Cardiac TAVR CT  TECHNIQUE: The patient was scanned on a Siemens 889 slice Force scanner. A 100 kV retrospective scan was triggered in the ascending thoracic aorta at 140 HU's. Gantry rotation speed was 250 msecs and collimation was .6 mm. No beta blockade or nitro were given. The 3D data set was reconstructed in 5% intervals of the R-R cycle. Systolic and diastolic phases were analyzed on a dedicated work station using MPR, MIP and VRT modes. The patient received 80 cc of contrast.  FINDINGS: Aortic Valve: Tri leaflet, thickened and calcified with restricted motion  Aorta: Moderate mixed plaque in the aortic arch and descending thoracic aorta. Some calcium in the intervalvular fibrosa and MAC  Sinotubular Junction:  25 mm  Ascending Thoracic Aorta:  36 mm  Aortic Arch:  27 mm  Descending Thoracic Aorta:  23 mm  Sinus of Valsalva Measurements:  Non-coronary:  28.4 mm  Right -coronary:  27.5 mm  Left -coronary:  27.4 mm  Coronary Artery Height above Annulus:  Left Main:  10.8 mm above annulus  Right Coronary:  11.9 mm above annulus  Virtual Basal Annulus Measurements:  Maximum/Minimum Diameter:  21.4 mm x 16.8 mm  Perimeter:  62 mm  Area:  308 mm2  Coronary Arteries:  Sufficient height above annulus for deployment  Optimum Fluoroscopic Angle for Delivery: LAO 33 degrees Cranial 5 degrees  IMPRESSION: 1) Calcified tri leaflet aortic valve with annular area of 308 mm2 suitable for a 20 mm Sapien 3 valve and perimeter of 62 mm suitable for a 23 mm Evolut Pro valve  2) Optimum angiographic angle for deployment LAO 33 degrees Cranial 5 degrees  3) No LAA thrombus  4) Aortic root 3.6 cm  5) Coronary arteries  sufficient height above annulus for deployment  Jenkins Rouge   Electronically Signed    By: Jenkins Rouge M.D.   On: 09/19/2016 17:38   Addended by Josue Hector, MD on 09/19/2016 5:41 PM    Study Result   EXAM: OVER-READ INTERPRETATION  CT CHEST  The following report is an over-read performed by radiologist Dr. Rebekah Chesterfield [facility] on 09/19/2016. This over-read does not include interpretation of cardiac or coronary anatomy or pathology. The coronary calcium score/coronary CTA interpretation by the cardiologist is attached.  COMPARISON:  None.  FINDINGS: Extracardiac findings will be described under separate dictation for contemporaneously obtained CTA of the chest, abdomen and pelvis.  IMPRESSION: Please see separate dictation for contemporaneously obtained CTA of the chest, abdomen and pelvis dated 09/19/2016 for full description of relevant extracardiac findings.  Electronically Signed: By: Vinnie Langton M.D. On: 09/19/2016 16:12       CLINICAL DATA:  81 year old female with history of severe aortic stenosis. Preprocedural study prior to potential transcatheter aortic valve replacement (TAVR) procedure.  EXAM: CT ANGIOGRAPHY CHEST, ABDOMEN AND PELVIS  TECHNIQUE: Multidetector CT imaging through the chest, abdomen and pelvis was performed using the standard protocol during bolus administration of intravenous contrast. Multiplanar reconstructed images and MIPs were obtained and reviewed to evaluate the vascular anatomy.  CONTRAST:  100 mL of Isovue 370.  COMPARISON:  No priors.  FINDINGS: CTA CHEST FINDINGS  Cardiovascular: Heart size is mildly enlarged. There is no significant pericardial fluid, thickening or pericardial calcification. There is aortic atherosclerosis, as well as atherosclerosis of the great vessels of the mediastinum and the coronary arteries, including calcified atherosclerotic plaque in the left main, left anterior descending and left circumflex coronary arteries. Severe thickening  calcification of the aortic valve. Calcification of the mitral annulus.  Mediastinum/Lymph Nodes: No pathologically enlarged mediastinal or hilar lymph nodes. Please note that accurate exclusion of hilar adenopathy is limited on noncontrast CT scans. Small hiatal hernia. No axillary lymphadenopathy.  Lungs/Pleura: 3 x 9 mm (mean diameter of 6 mm) subpleural nodule in the posterior aspect of the right lower lobe (axial image 68 of series 14). No other larger more suspicious appearing pulmonary nodules or masses. No acute consolidative airspace disease. No pleural effusions.  Musculoskeletal/Soft Tissues: There are no aggressive appearing lytic or blastic lesions noted in the visualized portions of the skeleton.  CTA ABDOMEN AND PELVIS FINDINGS  Hepatobiliary: Diffuse low attenuation throughout the hepatic parenchyma, strongly suggestive of hepatic steatosis (difficult to say for certain on today's contrast enhanced examination). No definite cystic or solid hepatic lesions are identified. No intra or extrahepatic biliary ductal dilatation. Gallbladder is unremarkable in appearance.  Pancreas: No pancreatic mass. No pancreatic ductal dilatation. No pancreatic or peripancreatic fluid or inflammatory changes.  Spleen: Unremarkable.  Adrenals/Urinary Tract: Bilateral kidneys and bilateral adrenal glands are normal in appearance. No hydroureteronephrosis. Urinary bladder is normal in appearance.  Stomach/Bowel: Normal appearance of the stomach. No pathologic dilatation of small bowel or colon. A few scattered colonic diverticulae are noted, without surrounding inflammatory changes to suggest an acute diverticulitis at this time. Normal appendix.  Vascular/Lymphatic: Aortic atherosclerosis, with vascular findings and measurements pertinent to potential TAVR procedure, as detailed below. No aneurysm or dissection noted in the abdominal or pelvic vasculature. The celiac  axis, superior mesenteric artery and inferior mesenteric artery are all patent without hemodynamically significant stenosis. Renal arteries are patent bilaterally (2 on the right and 1 on the left) without definite hemodynamically significant stenoses. No  lymphadenopathy noted in the abdomen or pelvis.  Reproductive: 4.5 x 4.9 x 4.3 cm heterogeneously enhancing lesion in the right side of the uterine body, incompletely characterized but likely to represent a a leiomyoma. Ovaries are atrophic.  Other: No significant volume of ascites.  No pneumoperitoneum.  Musculoskeletal: There are no aggressive appearing lytic or blastic lesions noted in the visualized portions of the skeleton.  VASCULAR MEASUREMENTS PERTINENT TO TAVR:  AORTA:  Minimal Aortic Diameter -  14 x 14 mm  Severity of Aortic Calcification -  mild  RIGHT PELVIS:  Right Common Iliac Artery -  Minimal Diameter - 100. x 10.4 mm  Tortuosity - mild  Calcification - mild  Right External Iliac Artery -  Minimal Diameter - 8.3 x 7.7 mm  Tortuosity - severe  Calcification - mild  Right Common Femoral Artery -  Minimal Diameter - 7.7 x 8.7 mm  Tortuosity - mild  Calcification - mild  LEFT PELVIS:  Left Common Iliac Artery -  Minimal Diameter - 9.9 x 9.2 mm  Tortuosity - mild  Calcification - mild  Left External Iliac Artery -  Minimal Diameter - 8.2 x 7.9 mm  Tortuosity - severe  Calcification - minimal  Left Common Femoral Artery -  Minimal Diameter - 8.5 x 8.7 mm  Tortuosity - mild  Calcification - none  Review of the MIP images confirms the above findings.  IMPRESSION: 1. Vascular findings and measurements pertinent to potential TAVR procedure, as detailed above. This patient does have suitable pelvic arterial access bilaterally. 2. Severe thickening calcification of the aortic valve, compatible with the reported clinical history of severe aortic  stenosis. 3. Aortic atherosclerosis, in addition to left main and 2 vessel coronary artery disease. Assessment for potential risk factor modification, dietary therapy or pharmacologic therapy may be warranted, if clinically indicated. 4. Probable fibroid in the uterus, as discussed above. 5. Additional incidental findings, as above. Aortic Atherosclerosis (ICD10-I70.0).   Electronically Signed   By: Vinnie Langton M.D.   On: 09/20/2016 08:21  STS Risk Calculator Procedure: AV Replacement  Risk of Mortality: 2.283%  Morbidity or Mortality: 16.689%  Long Length of Stay: 7.878%  Short Length of Stay: 29.93%  Permanent Stroke: 2.871%  Prolonged Ventilation: 11.752%  DSW Infection: 0.252%  Renal Failure: 3.79%  Reoperation: 6.917%    Impression:  The patient is a 81 year old woman with stage D, severe, symptomatic aortic stenosis with NYHA class II symptoms of exertional shortness of breath and fatigue consistent with chronic diastolic heart failure. I have personally reviewed the echo, cath and CTA studies. Echo shows a trileaflet aortic valve with severe thickening and calcification of the leaflets with restricted mobility. The mean gradient is 43 mm Hg consistent with severe AS. Cath shows no significant CAD. I agree that AVR is indicated in this patient. I think the risk of open surgical AVR would be at least moderately increased due to her age and small aortic root that would likely require annular enlargement or root replacement to prevent patient-prosthesis mismatch. I think TAVR would be a reasonable alternative. Her gated cardiac CTA shows a small aortic annulus that is suitable for a 20 mm Sapien 3 valve or a 23 mm Evolut Pro valve. Given her small annular size I think a 23 mm Evolut Pro would be the best choice to give her the lowest gradient. Her abdominal and pelvic CT shows anatomy suitable for transfemoral insertion.   The patient and her daughter were counseled at  length  regarding treatment alternatives for management of severe symptomatic aortic stenosis. The risks and benefits of surgical intervention has been discussed in detail. Long-term prognosis with medical therapy was discussed. Alternative approaches such as conventional surgical aortic valve replacement, transcatheter aortic valve replacement, and palliative medical therapy were compared and contrasted at length. This discussion was placed in the context of the patient's own specific clinical presentation and past medical history. All of their questions have been addressed.    Following the decision to proceed with transcatheter aortic valve replacement, a discussion was held regarding what types of management strategies would be attempted intraoperatively in the event of life-threatening complications, including whether or not the patient would be considered a candidate for the use of cardiopulmonary bypass and/or conversion to open sternotomy for attempted surgical intervention. The patient has been advised of a variety of complications that might develop including but not limited to risks of death, stroke, paravalvular leak, aortic dissection or other major vascular complications, aortic annulus rupture, device embolization, cardiac rupture or perforation, mitral regurgitation, acute myocardial infarction, arrhythmia, heart block or bradycardia requiring permanent pacemaker placement, congestive heart failure, respiratory failure, renal failure, pneumonia, infection, other late complications related to structural valve deterioration or migration, or other complications that might ultimately cause a temporary or permanent loss of functional independence or other long term morbidity. The patient provides full informed consent for the procedure as described and all questions were answered.     Plan:  Transfemoral TAVR using a 23 mm Evolut Pro valve.   I spent 30 minutes performing this consultation and > 50% of  this time was spent face to face counseling and coordinating the care of this patient's severe aortic stenosis.    Gaye Pollack, MD 10/02/2016

## 2016-10-17 NOTE — Care Management Note (Addendum)
Case Management Note  Patient Details  Name: Robin Sutton MRN: [id number] Date of Birth: 1935-03-15  Subjective/Objective:  From home alone, her daughter, Robin Sutton will be assisting her at discharge for 1 week, her PCP is Dr. Landry Sutton, she has a supplement that takes care of her medications,  Post op TAVR                 Action/Plan: NCM will follow for dc needs.  Expected Discharge Date:                  Expected Discharge Plan:     In-House Referral:     Discharge planning Services  CM Consult  Post Acute Care Choice:    Choice offered to:     DME Arranged:    DME Agency:     HH Arranged:    HH Agency:     Status of Service:  In process, will continue to follow  If discussed at Long Length of Stay Meetings, dates discussed:    Additional Comments:  Robin Mayo, RN 10/17/2016, 11:15 AM

## 2016-10-17 NOTE — Anesthesia Procedure Notes (Signed)
Arterial Line Insertion Start/End10/11/2016 6:30 AM, 10/17/2016 6:40 AM Performed by: Moshe Salisbury, CRNA  Patient location: Pre-op. Preanesthetic checklist: patient identified, IV checked, site marked, risks and benefits discussed, surgical consent, monitors and equipment checked, pre-op evaluation, timeout performed and anesthesia consent Lidocaine 1% used for infiltration and patient sedated Right, radial was placed Catheter size: 20 G Hand hygiene performed , maximum sterile barriers used  and Seldinger technique used  Attempts: 1 Procedure performed without using ultrasound guided technique. Following insertion, dressing applied and Biopatch. Post procedure assessment: normal  Patient tolerated the procedure well with no immediate complications.

## 2016-10-17 NOTE — Anesthesia Procedure Notes (Signed)
Central Venous Catheter Insertion Performed by: Effie Berkshire, anesthesiologist Start/End10/11/2016 6:35 AM, 10/17/2016 6:45 AM Patient location: Pre-op. Preanesthetic checklist: patient identified, IV checked, site marked, risks and benefits discussed, surgical consent, monitors and equipment checked, pre-op evaluation, timeout performed and anesthesia consent Position: Trendelenburg Lidocaine 1% used for infiltration and patient sedated Hand hygiene performed , maximum sterile barriers used  and Seldinger technique used Catheter size: 8 Fr Total catheter length 16. Central line was placed.Double lumen Procedure performed using ultrasound guided technique. Ultrasound Notes:anatomy identified, needle tip was noted to be adjacent to the nerve/plexus identified, no ultrasound evidence of intravascular and/or intraneural injection and image(s) printed for medical record Attempts: 1 Following insertion, dressing applied, line sutured and Biopatch. Post procedure assessment: blood return through all ports  Patient tolerated the procedure well with no immediate complications.

## 2016-10-17 NOTE — Op Note (Signed)
  [facility]   MULTIDISCIPLINARY HEART VALVE TEAM   TAVR OPERATIVE NOTE Date of Procedure:  10/17/2016  Preoperative Diagnosis: Severe Aortic Stenosis   Postoperative Diagnosis: Same   Procedure:    Transcatheter Aortic Valve Replacement - Percutaneous Transfemoral Approach  Medtronic Evolut Pro THV (size 23 mm, serial # [id number])   Co-Surgeons:  Valentina Gu. Roxy Manns, MD and Sherren Mocha, MD  Anesthesiologist:  Belinda Block, MD  Echocardiographer:  Ena Dawley, MD  Pre-operative Echo Findings:  Severe aortic stenosis  Vigorous left ventricular systolic function  Post-operative Echo Findings:  No paravalvular leak  unchanged left ventricular systolic function  BRIEF CLINICAL NOTE AND INDICATIONS FOR SURGERY  Please see the complete operative note from Dr Roxy Manns for full details of the procedure and it's indications.   DETAILS OF THE OPERATIVE PROCEDURE PERIPHERAL ACCESS:   Using ultrasound guidance, femoral arterial and venous access is obtained with placement of 6 Fr sheaths on the left side. An additional 4Fr arterial sheath was inserted when the femoral artery was punctured a second time.  A pigtail diagnostic catheter was passed through the 6 Fr femoral arterial sheath under fluoroscopic guidance into the aortic root.  A temporary transvenous pacemaker catheter was passed through the femoral venous sheath under fluoroscopic guidance into the right ventricle.  The pacemaker was tested to ensure stable lead placement and pacemaker capture. Aortic root angiography was performed in order to determine the optimal angiographic angle for valve deployment.  TRANSFEMORAL ACCESS:  A micropuncture technique is used to access the right femoral artery under fluoroscopic and ultrasound guidance.  2 Perclose devices are deployed at 10' and 2' positions to 'PreClose' the femoral artery. An 8 French sheath is placed and then an Amplatz Superstiff wire is advanced through  the sheath. This is changed out for a 20 [place] Dry-Seal Sheath after progressively dilating over the Superstiff wire.  An AL-1 catheter was used to direct a straight-tip exchange length wire across the native aortic valve into the left ventricle. This was exchanged out for a pigtail catheter and position was confirmed in the LV apex. Simultaneous LV and Ao pressures were recorded.  The pigtail catheter was exchanged for an Amplatz Extra-stiff wire in the LV apex.  Echocardiography was utilized to confirm appropriate wire position and no sign of entanglement in the mitral subvalvular apparatus.  PROCEDURE COMPLETION:  The sheath was removed and femoral artery closure is performed using the 2 previously deployed Perclose devices.  Protamine is administered once femoral arterial repair was complete. The site is clear with no evidence of bleeding or hematoma after the sutures are tightened. The temporary pacemaker, pigtail catheters and femoral sheaths were removed with manual pressure used for hemostasis.   The patient tolerated the procedure well and is transported to the surgical intensive care in stable condition. There were no immediate intraoperative complications. All sponge instrument and needle counts are verified correct at completion of the operation.   The patient received a total of 30 mL of intravenous contrast during the procedure.  Sherren Mocha, MD 10/17/2016 10:28 AM

## 2016-10-17 NOTE — Interval H&P Note (Signed)
History and Physical Interval Note:  10/17/2016 7:24 AM  Robin Sutton  has presented today for surgery, with the diagnosis of severe aortic stenosis  The various methods of treatment have been discussed with the patient and family. After consideration of risks, benefits and other options for treatment, the patient has consented to  Procedure(s): TRANSCATHETER AORTIC VALVE REPLACEMENT, TRANSFEMORAL (N/A) TRANSESOPHAGEAL ECHOCARDIOGRAM (TEE) (N/A) as a surgical intervention .  The patient's history has been reviewed, patient examined, no change in status, stable for surgery.  I have reviewed the patient's chart and labs.  Questions were answered to the patient's satisfaction.    Pt presents for outpatient TAVR, rescheduled from last week secondary to an emergency. No changes in symptoms or exam since last evaluation. Plans for transfemoral TAVR reviewed with patient again today.  Sherren Mocha

## 2016-10-17 NOTE — Progress Notes (Addendum)
  [facility]   MULTIDISCIPLINARY HEART VALVE TEAM  1pm  Robin Sutton is doing well s/p TAVR this AM. Still a little groggy. Tele shows sinus bradycardia with HRs in 30-50s but no evidence of high grade block. She is HD stable on low dose of neo-synephrine. Mild pulmonary vascular congestion noted on post op CXR. She is breathing well. Will continue to monitor closely.   Angelena Form PA-C  [facility]  Pager [phone]  4:31pm  Groin sites look good. Patients BP now normal and off neo.  HR stable in 50s. Will discontinue arterial line.    Angelena Form PA-C  [facility]

## 2016-10-17 NOTE — Anesthesia Postprocedure Evaluation (Signed)
Anesthesia Post Note  Patient: Robin Sutton  Procedure(s) Performed: TRANSCATHETER AORTIC VALVE REPLACEMENT, TRANSFEMORAL (N/A Chest) TRANSESOPHAGEAL ECHOCARDIOGRAM (TEE) (N/A )     Anesthesia Post Evaluation  Last Vitals:  Vitals:   10/17/16 1130 10/17/16 1145  BP: (!) 140/59 112/62  Pulse: (!) 41 (!) 43  Resp: (!) 21 20  Temp:    SpO2: 100% 100%    Last Pain:  Vitals:   10/17/16 1100  TempSrc: Axillary                 Riana Tessmer

## 2016-10-17 NOTE — Progress Notes (Signed)
[facility] BRIEF SICU PROGRESS NOTE  Day of Surgery  S/P Procedure(s) (LRB): TRANSCATHETER AORTIC VALVE REPLACEMENT, TRANSFEMORAL (N/A) TRANSESOPHAGEAL ECHOCARDIOGRAM (TEE) (N/A)   Doing well Sinus brady w/ stable BP Both groins okay  Plan: Continue routine post TAVR  Rexene Alberts, MD 10/17/2016 5:38 PM

## 2016-10-17 NOTE — Transfer of Care (Signed)
Immediate Anesthesia Transfer of Care Note  Patient: Robin Sutton  Procedure(s) Performed: TRANSCATHETER AORTIC VALVE REPLACEMENT, TRANSFEMORAL (N/A Chest) TRANSESOPHAGEAL ECHOCARDIOGRAM (TEE) (N/A )  Patient Location: ICU  Anesthesia Type:MAC  Level of Consciousness: drowsy and patient cooperative  Airway & Oxygen Therapy: Patient Spontanous Breathing and Patient connected to nasal cannula oxygen  Post-op Assessment: Report given to RN, Post -op Vital signs reviewed and stable and Patient moving all extremities  Post vital signs: Reviewed and stable  Last Vitals:  Vitals:   10/17/16 0607 10/17/16 0904  BP: (!) 108/46   Pulse: 66 60  Resp: 18   Temp: 36.6 C   SpO2: 99%     Last Pain:  Vitals:   10/17/16 0607  TempSrc: Oral         Complications: No apparent anesthesia complications

## 2016-10-17 NOTE — Op Note (Signed)
[facility]   MULTIDISCIPLINARY HEART VALVE TEAM   TAVR OPERATIVE NOTE   Date of Procedure:  10/17/2016  Preoperative Diagnosis: Severe Aortic Stenosis   Postoperative Diagnosis: Same   Procedure:    Transcatheter Aortic Valve Replacement - Percutaneous Right Transfemoral Approach  Medtronic CoreValve Evolut Pro (size 23 mm, serial # [id number])   Co-Surgeons:  Valentina Gu. Roxy Manns, MD and Sherren Mocha, MD  Anesthesiologist:  Belinda Block, MD  Echocardiographer:  Ena Dawley, MD  Pre-operative Echo Findings:  Severe aortic stenosis  Normal left ventricular systolic function  Post-operative Echo Findings:  No paravalvular leak  Normal left ventricular systolic function   BRIEF CLINICAL NOTE AND INDICATIONS FOR SURGERY  Patient is a 81 year old female with history of aortic stenosis, hypertension, previous stroke, and chronic diastolic congestive heart failure referred for surgical consultation to discuss treatment options for management of severe symptomatic aortic stenosis. The patient states that she has known about the presence of a heart murmur for several years. 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 She still has very mild right-sided weakness but overall recovered uneventfully. She has remained entirely functionally independent although she admits that she lives a somewhat sedentary lifestyle. Over the past several months she has developed symptoms of exertional shortness breath and fatigue. She was seen in follow-up by her primary care physician and noted to have a prominent murmur on physical exam. She was referred to Dr. Agustin Cree and underwent transthoracic echocardiogram that revealed severe aortic stenosis with preserved left ventricular systolic function. She subsequently underwent left and right heart catheterization by Dr. Burt Knack confirming the presence of at least moderate aortic stenosis  with mild nonobstructive coronary artery disease. CT angiography was performed and cardiothoracic surgical consultation was requested.  During the course of the patient's preoperative work up they have been evaluated comprehensively by a multidisciplinary team of specialists coordinated through the [facility] in the [facility].  They have been demonstrated to suffer from symptomatic severe aortic stenosis as noted above. The patient has been counseled extensively as to the relative risks and benefits of all options for the treatment of severe aortic stenosis including long term medical therapy, conventional surgery for aortic valve replacement, and transcatheter aortic valve replacement.  All questions have been answered, and the patient provides full informed consent for the operation as described.   DETAILS OF THE OPERATIVE PROCEDURE  PREPARATION:    The patient is brought to the operating room on the above mentioned date and central monitoring was established by the anesthesia team including placement of a central venous line and radial arterial line. The patient is placed in the supine position on the operating table.  Intravenous antibiotics are administered. The patient is monitored closely throughout the procedure under conscious sedation.  Baseline transthoracic echocardiogram was performed. The patient's chest, abdomen, both groins, and both lower extremities are prepared and draped in a sterile manner. A time out procedure is performed.   PERIPHERAL ACCESS:    Using the modified Seldinger technique, femoral arterial and venous access was obtained with placement of 6 Fr sheaths on the left side.  A pigtail diagnostic catheter was passed through the left femoral arterial sheath under fluoroscopic guidance into the aortic root.  The pigtail catheter is positioned in the non-coronary sinus of Valsalva.  A temporary transvenous pacemaker  catheter was passed through the left femoral venous sheath under fluoroscopic guidance into the right ventricle.  The pacemaker was tested to ensure stable lead placement and pacemaker capture.    TRANSFEMORAL ACCESS:   Percutaneous transfemoral access and sheath placement was performed by Dr. Burt Knack using ultrasound guidance.  The right common femoral artery was cannulated using a micropuncture needle and appropriate location was verified using hand injection angiogram.  A pair of Abbott Perclose percutaneous closure devices were placed and a 6 French sheath replaced into the femoral artery.  The patient was heparinized systemically and ACT verified > 250 seconds.    A 20 Fr transfemoral Gore DrySeal sheath was introduced into the right common femoral artery after progressively dilating over an Amplatz superstiff wire. An AL-1 catheter was used to direct a straight-tip exchange length wire across the native aortic valve into the left ventricle. This was exchanged out for a pigtail catheter and position was confirmed in the LV apex. The pigtail catheter was exchanged for Confida wire in the LV apex.  Echocardiography was utilized to confirm appropriate wire position and no sign of entanglement in the mitral subvalvular apparatus.   TRANSCATHETER HEART VALVE DEPLOYMENT:   A Medtronic CoreValve Evolut Pro transcatheter heart valve (size 23 mm, serial #[id number]) was prepared and crimped per manufacturer's guidelines, and the proper loading of the valve is confirmed on the [facility] Pro delivery system using flouroscopy. The DrySeal sheath was removed and the valve and delivery system were advanced over the guidewire, through the iliac arteries and aorta, and advanced across the aortic arch using flouroscopy. The valve was carefully positioned across the aortic valve annulus. Once appropriate position of the valve has been confirmed by angiographic assessment, the valve is deployed gradually to 80%, at which time  a second aortogram was performed to confirm the appropriate depth and position of deployment.  Once final position was confirmed, deployment was completed, the valve released, and the delivery system carefully removed from the aortic root. Valve function is assessed using echocardiography. There is felt to be moderate paravalvular leak and no central aortic insufficiency.  The valve delivery system was removed from the patient while keeping the guidewire in place.  An 18 Fr Tru-balloon was prepared for balloon valvuloplasty.  Balloon valvuloplasty was performed under rapid pacing without any complications.  Follow up echocardiography revealed no residual leak.  The patient's hemodynamic recovery following valve deployment and subsequent balloon valvuloplasty were both good.     PROCEDURE COMPLETION:   The balloon, guidewire and sheath were removed and femoral artery closure performed by securing the Perclose sutures.  Protamine was administered once femoral arterial repair was complete. The temporary pacemaker, pigtail catheters and femoral sheaths were removed with manual pressure used for hemostasis.   The patient tolerated the procedure well and is transported to the surgical intensive care in stable condition. There were no immediate intraoperative complications. All sponge instrument and needle counts are verified correct at completion of the operation.   No blood products were administered during the operation.  The patient received a total of 30.2 mL of intravenous contrast during the procedure.   Rexene Alberts, MD 10/17/2016 10:06 AM

## 2016-10-18 ENCOUNTER — Inpatient Hospital Stay (HOSPITAL_COMMUNITY): Payer: Medicare Other

## 2016-10-18 ENCOUNTER — Encounter (HOSPITAL_COMMUNITY): Payer: Self-pay | Admitting: Physician Assistant

## 2016-10-18 ENCOUNTER — Other Ambulatory Visit: Payer: Self-pay

## 2016-10-18 DIAGNOSIS — I350 Nonrheumatic aortic (valve) stenosis: Secondary | ICD-10-CM

## 2016-10-18 DIAGNOSIS — I340 Nonrheumatic mitral (valve) insufficiency: Secondary | ICD-10-CM

## 2016-10-18 DIAGNOSIS — Z8673 Personal history of transient ischemic attack (TIA), and cerebral infarction without residual deficits: Secondary | ICD-10-CM

## 2016-10-18 DIAGNOSIS — Z952 Presence of prosthetic heart valve: Secondary | ICD-10-CM

## 2016-10-18 DIAGNOSIS — Z954 Presence of other heart-valve replacement: Secondary | ICD-10-CM

## 2016-10-18 LAB — BASIC METABOLIC PANEL
Anion gap: 6 (ref 5–15)
BUN: 16 mg/dL (ref 6–20)
CHLORIDE: 105 mmol/L (ref 101–111)
CO2: 26 mmol/L (ref 22–32)
CREATININE: 0.93 mg/dL (ref 0.44–1.00)
Calcium: 9 mg/dL (ref 8.9–10.3)
GFR calc Af Amer: >60 mL/min (ref >60)
GFR calc non Af Amer: 57 mL/min — ABNORMAL LOW (ref >60)
GLUCOSE: 110 mg/dL — AB (ref 65–99)
Potassium: 4 mmol/L (ref 3.5–5.1)
Sodium: 137 mmol/L (ref 135–145)

## 2016-10-18 LAB — ECHOCARDIOGRAM COMPLETE
Height: 63 in
Weight: 2920.65 oz

## 2016-10-18 LAB — CBC
HEMATOCRIT: 35.2 % — AB (ref 36.0–46.0)
HEMOGLOBIN: 11.2 g/dL — AB (ref 12.0–15.0)
MCH: 29.8 pg (ref 26.0–34.0)
MCHC: 31.8 g/dL (ref 30.0–36.0)
MCV: 93.6 fL (ref 78.0–100.0)
Platelets: 105 10*3/uL — ABNORMAL LOW (ref 150–400)
RBC: 3.76 MIL/uL — ABNORMAL LOW (ref 3.87–5.11)
RDW: 13.3 % (ref 11.5–15.5)
WBC: 5.7 10*3/uL (ref 4.0–10.5)

## 2016-10-18 LAB — MAGNESIUM: Magnesium: 2.1 mg/dL (ref 1.7–2.4)

## 2016-10-18 MED ORDER — DIPHENHYDRAMINE HCL 25 MG PO CAPS
25.0000 mg | ORAL_CAPSULE | Freq: Four times a day (QID) | ORAL | Status: DC | PRN
  Administered 2016-10-18 – 2016-10-21 (×5): 25 mg via ORAL
  Filled 2016-10-18 (×6): qty 1

## 2016-10-18 NOTE — Progress Notes (Signed)
Report called to charge RN Ebony Hail at this time.

## 2016-10-18 NOTE — Progress Notes (Signed)
  Echocardiogram 2D Echocardiogram has been performed.  Robin Sutton 10/18/2016, 8:56 AM

## 2016-10-18 NOTE — Discharge Summary (Addendum)
[place] [place] VALVE TEAM   Discharge Summary    Patient ID: Robin Sutton,  MRN: [id number], DOB/AGE: April 11, 1935 81 y.o.  Admit date: 10/17/2016 Discharge date: 10/21/2016  Primary Care Provider: Maylon Cos Primary Cardiologist: Dr. Agustin Cree / Dr. Burt Knack (TAVR)   Discharge Diagnoses    Principal Problem:   S/P TAVR (transcatheter aortic valve replacement) Active Problems:   Severe aortic stenosis   Dyslipidemia   Essential hypertension   History of CVA (cerebrovascular accident)   CHB (complete heart block) (HCC)   Allergies Allergies  Allergen Reactions  . Adhesive [Tape] Dermatitis    Skin Irritation     History of Present Illness     Robin Sutton is a 81 y.o. female with a history of HTN, HLD, previous CVA and severe AS who presented to [facility] on 10/17/16 for planned TAVR.   The patient states that she has known about the presence of a heart murmur for several years. 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 She still has very mild right-sided weakness but overall recovered uneventfully. She has remained entirely functionally independent although she admits that she lives a somewhat sedentary lifestyle. Over the past several months she has developed symptoms of exertional shortness breath and fatigue. She was seen in follow-up by her primary care physician and noted to have a prominent murmur on physical exam. She was referred to Dr. Agustin Cree and underwent transthoracic echocardiogram that revealed severe aortic stenosis with preserved left ventricular systolic function. She subsequently underwent left and right heart catheterization by Dr. Burt Knack confirming the presence of at least moderate aortic stenosis with mild nonobstructive coronary artery disease.  She was referred to the multidisciplinary valve team who ultimately recommended TAVR, which was scheduled for  10/24/16.  Hospital Course     Consultants: none  Severe AS: s/p successful TAVR with a 42mm Medtronic Evolut Pro via the R TF approach on 10/17/16. Groin sites are stable. ECG with sinus rhythm and new LBBB. HRs have remained stable with no evidence of high grade block. Post operative 2D ECHO showed good TAVR placement with moderately elevated gradients related hyperdynamic LV function and small aortic valve annulus. Continue ASA 81mg  daily and plavix 75mg  daily. We will see her back in the office in 1-2 weeks for a TOC appointment followed by a 1 month appointment with an echocardiogram.   CHB: she developed sinus bradycardia s/p TAVR. This initially seemed to improve, but then she developed symptomatic high grade heart block overnight on 10/13. A temporary pacemaker was placed and then she underwent permanent pacemaker placement by Dr. Rayann Heman on 10/20/16. [facility] Jude Medical Assurity MRI conditional  dual-chamber pacemaker [phone] (serial number  [id number]). All follow up has been arranged.   HTN: BP has been soft. Home lisinopril 10mg  daily was held during her admission. Her BP has improved after pacemaker placement. Will resume this at discharge.   Hx of CVA: continue ASA and statin. She was previously on ASA 325mg  daily but we have decreased this to 81mg  daily given addition of plavix   HLD: continue statin   The patient otherwise has had an uncomplicated hospital course and is recovering well. The femoral catheter sites are stable. She has been seen by Dr. Burt Knack today and deemed ready for discharge home. All follow-up appointments have been scheduled. Discharge medications are listed below.  _____________  Discharge Vitals Blood pressure (!) 123/100, pulse 98, temperature (!) 97.5  F (36.4 C), temperature source Oral, resp. rate (!) 24, height 5\' 3"  (1.6 m), weight 188 lb 0.8 oz (85.3 kg), SpO2 99 %.  Filed Weights   10/19/16 0800 10/20/16 0413 10/21/16 0600  Weight: 182 lb 8.7 oz (82.8  kg) 186 lb 11.7 oz (84.7 kg) 188 lb 0.8 oz (85.3 kg)    Labs & Radiologic Studies     CBC  Recent Labs  10/19/16 0218  WBC 5.5  HGB 11.9*  HCT 37.5  MCV 93.5  PLT 94*   Basic Metabolic Panel  Recent Labs  10/19/16 0218 10/20/16 0428  NA 139 140  K 4.4 3.8  CL 108 109  CO2 23 24  GLUCOSE 114* 109*  BUN 15 11  CREATININE 0.90 0.84  CALCIUM 8.9 8.8*   Liver Function Tests No results for input(s): AST, ALT, ALKPHOS, BILITOT, PROT, ALBUMIN in the last 72 hours. No results for input(s): LIPASE, AMYLASE in the last 72 hours. Cardiac Enzymes No results for input(s): CKTOTAL, CKMB, CKMBINDEX, TROPONINI in the last 72 hours. BNP Invalid input(s): POCBNP D-Dimer No results for input(s): DDIMER in the last 72 hours. Hemoglobin A1C No results for input(s): HGBA1C in the last 72 hours. Fasting Lipid Panel No results for input(s): CHOL, HDL, LDLCALC, TRIG, CHOLHDL, LDLDIRECT in the last 72 hours. Thyroid Function Tests No results for input(s): TSH, T4TOTAL, T3FREE, THYROIDAB in the last 72 hours.  Invalid input(s): FREET3  Dg Chest 2 View  Result Date: 10/21/2016 CLINICAL DATA:  Cardiac device in situ. EXAM: CHEST  2 VIEW COMPARISON:  10/17/2016 FINDINGS: Normal heart size and mediastinal contours. Dual-chamber pacer leads and transcatheter aortic valve replacement. A right IJ line has been removed. There is no edema, consolidation, effusion, or pneumothorax. IMPRESSION: No evidence of active disease. Electronically Signed   By: Monte Fantasia M.D.   On: 10/21/2016 07:18   Dg Chest 2 View  Result Date: 10/02/2016 CLINICAL DATA:  Pre-op for AVR (transfemoral) on 10/08/16; pt reports SOB w/ exertion that has been ongoing; pt denies recent cough, fever, or CP; hx of HTN and cardiac catheterization; non-smoker EXAM: CHEST  2 VIEW COMPARISON:  09/06/2016 FINDINGS: The heart size and mediastinal contours are within normal limits. Both lungs are clear. No pleural effusion or  pneumothorax. The visualized skeletal structures are intact. IMPRESSION: No active cardiopulmonary disease. Electronically Signed   By: Lajean Manes M.D.   On: 10/02/2016 19:44   Dg Chest Port 1 View  Result Date: 10/17/2016 CLINICAL DATA:  Status post transcatheter aortic valve replacement. EXAM: PORTABLE CHEST 1 VIEW COMPARISON:  Radiographs of October 02, 2016. FINDINGS: Stable cardiomediastinal silhouette. Atherosclerosis of thoracic aorta is noted. Mild central pulmonary vascular congestion is noted. Aortic valve prosthesis is noted. Right internal jugular catheter is noted with distal tip in expected position of the SVC. No pneumothorax or pleural effusion is noted. Right lung is clear. Mild left basilar subsegmental atelectasis is noted. Bony thorax is unremarkable. IMPRESSION: Aortic atherosclerosis. Mild central pulmonary vascular congestion. Status post aortic valve repair. Mild left basilar subsegmental atelectasis. Electronically Signed   By: Marijo Conception, M.D.   On: 10/17/2016 11:12     Diagnostic Studies/Procedures     TAVR OPERATIVE NOTE Date of Procedure:10/17/2016   Transcatheter Aortic Valve Replacement - Percutaneous Transfemoral Approach Medtronic Evolut ProTHV (size 30mm, serial # [id number]) Pre-operative Echo Findings: ? Severe aortic stenosis ? Vigorous left ventricular systolic function  Post-operative Echo Findings: ? Noparavalvular leak ? unchangedleft ventricular systolic function _____________  Post operative 2D ECHO 10/18/16 LV EF: 70% -   75% Study Conclusions - Left ventricle: The cavity size was normal. There was mild   concentric hypertrophy. Systolic function was hyperdynamic. The   estimated ejection fraction was in the range of 70% to 75%. There   was no dynamic obstruction. Wall motion was normal; there were no   regional wall motion abnormalities. Features are consistent with   a pseudonormal left  ventricular filling pattern, with concomitant   abnormal relaxation and increased filling pressure (grade 2   diastolic dysfunction). - Aortic valve: A stent-valve (TAVR) bioprosthesis was present.   Transvalvular velocity was increased more than expected, due to   high cardiac output. There was mild stenosis. Valve area (VTI):   1.52 cm^2. - Mitral valve: Calcified annulus. - Left atrium: The atrium was mildly dilated. - Atrial septum: No defect or patent foramen ovale was identified. - Pulmonary arteries: Systolic pressure was mildly increased. PA   peak pressure: 40 mm Hg (S). - Pericardium, extracardiac: A trivial pericardial effusion was   identified. Impressions: - New TAVR prosthesis with moderately increased gradients, in large   part due to hyperdynamic LV function and increased stroke volume.   Gradients have ncreased substantially since implantation.  Disposition   Pt is being discharged home today in good condition.  Follow-up Plans & Appointments    Follow-up Information    Eileen Stanford, PA-C. Go on 10/31/2016.   Specialties:  Cardiology, Radiology Why:  @ 2:30pm, please arrive at least 10 minutes early.  Contact information: [address] [place] [address] [phone]        [place] [facility] Follow up on 10/31/2016.   Specialty:  Cardiology Why:  at 3:30PM for wound check  Contact information: [address], [place]       Thompson Grayer, MD Follow up on 01/22/2017.   Specialty:  Cardiology Why:  at 11:45AM Contact information: [place] [address] [phone]          Discharge Instructions    Amb Referral to Cardiac Rehabilitation    Complete by:  As directed    Diagnosis:  Valve Replacement Comment - to    Valve:  Aortic Comment - TAVR      Discharge Medications     Medication List    STOP taking  these medications   aspirin 325 MG tablet Replaced by:  aspirin 81 MG EC tablet     TAKE these medications   aspirin 81 MG EC tablet Take 1 tablet (81 mg total) by mouth daily. Replaces:  aspirin 325 MG tablet   clopidogrel 75 MG tablet Commonly known as:  PLAVIX Take 1 tablet (75 mg total) by mouth daily with breakfast.   furosemide 20 MG tablet Commonly known as:  LASIX Take 1 tablet (20 mg total) by mouth daily.   lisinopril 10 MG tablet Commonly known as:  PRINIVIL,ZESTRIL Take 10 mg by mouth every evening.   polyethylene glycol powder powder Commonly known as:  GLYCOLAX/MIRALAX Take 17 g by mouth as needed for constipation.   potassium chloride 10 MEQ tablet Commonly known as:  K-DUR,KLOR-CON Take 1 tablet (10 mEq total) by mouth daily.   pravastatin 40 MG tablet Commonly known as:  PRAVACHOL Take 40 mg by mouth every evening.   Vitamin D (Ergocalciferol) 50000 units Caps capsule Commonly known as:  DRISDOL Take 50,000 Units  by mouth every Monday.         Outstanding Labs/Studies   none  Duration of Discharge Encounter   Greater than 30 minutes including physician time.  Signed, Reino Bellis PA-C 10/21/2016, 11:17 AM  Patient seen, examined. Available data reviewed. Agree with findings, assessment, and plan as outlined by Nell Range, PA-C. On my exam today: Vitals:   10/21/16 1000 10/21/16 1100  BP: (!) 105/53 (!) 123/100  Pulse: 91 98  Resp: (!) 27 (!) 24  Temp:    SpO2: 100% 99%   Pt is alert and oriented, NAD HEENT: normal Neck: JVP - normal Lungs: CTA bilaterally Chest: pacemaker site healing well CV: RRR with 2/6 SEM at the RUSB, no diastolic murmur Abd: soft, NT, Positive BS, no hepatomegaly Ext: mild diffuse edema trace-1+, distal pulses intact and equal Skin: warm/dry no rash  The patient is clinically stable and ready for discharge. Have reviewed her discharge instructions and post-TAVR restrictions. She does have mild  volume excess and her weight is up from admission. We'll send her home on Lasix 20 mg daily for 5 days. Would add K-Dur 10 milliequivalents daily for the short time that she is taking Lasix. Appreciate care of the EP service.  Sherren Mocha, M.D. 10/21/2016 11:17 AM

## 2016-10-18 NOTE — Progress Notes (Signed)
[place] VALVE TEAM  Patient Name: Robin Sutton Date of Encounter: 10/18/2016  Primary Cardiologist: Dr. Agustin Cree / Dr. Burt Knack (TAVR)  Hospital Problem List     Principal Problem:   S/P TAVR (transcatheter aortic valve replacement)     Subjective   No complaints. Feeling good.   Inpatient Medications    Scheduled Meds: . acetaminophen  1,000 mg Oral Q6H  . aspirin EC  81 mg Oral Daily  . clopidogrel  75 mg Oral Q breakfast  . [START ON 10/19/2016] pantoprazole  40 mg Oral Daily  . pravastatin  40 mg Oral QPM  . [START ON 10/21/2016] Vitamin D (Ergocalciferol)  50,000 Units Oral Q Mon   Continuous Infusions: . sodium chloride 20 mL/hr at 10/18/16 0800  . albumin human    . cefUROXime (ZINACEF)  IV Stopped (10/18/16 0818)  . lactated ringers     PRN Meds: albumin human, diphenhydrAMINE, lactated ringers, metoprolol tartrate, ondansetron (ZOFRAN) IV, traMADol   Vital Signs    Vitals:   10/18/16 0600 10/18/16 0700 10/18/16 0800 10/18/16 0900  BP: (!) 91/47 (!) 106/53 (!) 106/58 (!) 105/52  Pulse: 60 60 65 60  Resp: (!) 23 15 (!) 21 (!) 24  Temp:   98.4 F (36.9 C)   TempSrc:   Oral   SpO2: 93% 94% 97% 94%  Weight:      Height:        Intake/Output Summary (Last 24 hours) at 10/18/16 0921 Last data filed at 10/18/16 0900  Gross per 24 hour  Intake             2020 ml  Output             1200 ml  Net              820 ml   Filed Weights   10/17/16 1100  Weight: 182 lb 8.7 oz (82.8 kg)    Physical Exam   GEN: Well nourished, well developed, in no acute distress.  HEENT: Grossly normal.  Neck: Supple, no JVD, carotid bruits, or masses. Cardiac: RRR, no murmurs, rubs, or gallops. No clubbing, cyanosis, edema.  Radials/DP/PT 2+ and equal bilaterally.  Respiratory:  Respirations regular and unlabored, clear to auscultation bilaterally. GI: Soft, nontender, nondistended, BS + x 4. MS: no deformity or  atrophy. Skin: warm and dry, no rash. Groin sites stable Neuro:  Strength and sensation are intact. Psych: AAOx3.  Normal affect.  Labs    CBC  Recent Labs  10/17/16 1022 10/17/16 1027 10/18/16 0452  WBC 4.0  --  5.7  HGB 11.6* 11.2* 11.2*  HCT 36.1 33.0* 35.2*  MCV 92.8  --  93.6  PLT 120*  --  993*   Basic Metabolic Panel  Recent Labs  10/17/16 0625  10/17/16 0946 10/17/16 1027 10/18/16 0452  NA 139  < > 141 143 137  K 4.4  < > 4.2 4.1 4.0  CL 107  < > 105  --  105  CO2 23  --   --   --  26  GLUCOSE 106*  < > 138* 127* 110*  BUN 14  < > 13  --  16  CREATININE 0.89  < > 0.70  --  0.93  CALCIUM 9.4  --   --   --  9.0  MG  --   --   --   --  2.1  < > = values in this  interval not displayed. Liver Function Tests  Recent Labs  10/17/16 0625  AST 22  ALT 16  ALKPHOS 61  BILITOT 0.9  PROT 6.0*  ALBUMIN 4.0   No results for input(s): LIPASE, AMYLASE in the last 72 hours. Cardiac Enzymes No results for input(s): CKTOTAL, CKMB, CKMBINDEX, TROPONINI in the last 72 hours. BNP Invalid input(s): POCBNP D-Dimer No results for input(s): DDIMER in the last 72 hours. Hemoglobin A1C No results for input(s): HGBA1C in the last 72 hours. Fasting Lipid Panel No results for input(s): CHOL, HDL, LDLCALC, TRIG, CHOLHDL, LDLDIRECT in the last 72 hours. Thyroid Function Tests No results for input(s): TSH, T4TOTAL, T3FREE, THYROIDAB in the last 72 hours.  Invalid input(s): FREET3  Telemetry    Sinus and sinus brady with freq PACS and rare dropped sinus beats  - Personally Reviewed  ECG    Sinus with new LBBB and PACs. HR 65 - Personally Reviewed  Radiology    Dg Chest Port 1 View  Result Date: 10/17/2016 CLINICAL DATA:  Status post transcatheter aortic valve replacement. EXAM: PORTABLE CHEST 1 VIEW COMPARISON:  Radiographs of October 02, 2016. FINDINGS: Stable cardiomediastinal silhouette. Atherosclerosis of thoracic aorta is noted. Mild central pulmonary  vascular congestion is noted. Aortic valve prosthesis is noted. Right internal jugular catheter is noted with distal tip in expected position of the SVC. No pneumothorax or pleural effusion is noted. Right lung is clear. Mild left basilar subsegmental atelectasis is noted. Bony thorax is unremarkable. IMPRESSION: Aortic atherosclerosis. Mild central pulmonary vascular congestion. Status post aortic valve repair. Mild left basilar subsegmental atelectasis. Electronically Signed   By: Marijo Conception, M.D.   On: 10/17/2016 11:12    Cardiac Studies   TAVR OPERATIVE NOTE Date of Procedure:                10/17/2016   Transcatheter Aortic Valve Replacement - Percutaneous Transfemoral Approach             Medtronic Evolut Pro THV (size 23 mm, serial # [id number]) Pre-operative Echo Findings: ? Severe aortic stenosis ? Vigorous left ventricular systolic function  Post-operative Echo Findings: ? No paravalvular leak ? unchanged left ventricular systolic function  Patient Profile     Robin Sutton is a 81 y.o. female with a history of HTN, previous CVA and severe AS who presented to [facility] on 10/17/16 for planned TAVR.   Assessment & Plan    Severe AS: s/p successful TAVR with a 54mm Medtronic Evolut Pro via the R TF approach on 10/17/16. Groin sites are stable. ECG with sinus rhythm and new LBBB. Her HRs have improved from yesterday and now in 56s. Will remove central line and transfer to the floor. Plan for early ambulation and discharge home tomorrow. Continue ASA 81mg  daily and plavix 75mg  daily.   HTN: BP has been soft. Holding home Lisinopril 10mg  daily for now.   Hx of CVA: continue ASA and statin. She was previously on ASA 325mg  daily but we have decreased this to 81mg  daily given addition of plavix   Signed, Angelena Form, PA-C  10/18/2016, 9:21 AM  Pager [phone]   I have seen and examined the patient and agree with the assessment and plan as outlined.  Looks great.  New LBBB.   ECHO done - results pending.  Anticipate d/c home tomorrow   I spent in excess of 15 minutes during the conduct of this hospital encounter and >50% of this time involved direct face-to-face encounter with the patient  for counseling and/or coordination of their care.   Rexene Alberts, MD 10/18/2016 10:05 AM

## 2016-10-18 NOTE — Progress Notes (Signed)
CARDIAC REHAB PHASE I   PRE:  Rate/Rhythm: 75 SR  BP:  Sitting: 122/85        SaO2: 98 RA  MODE:  Ambulation: 940 ft   POST:  Rate/Rhythm: 108 ST  BP:  Sitting: 127/59         SaO2: 97 RA  Pt ambulated 940 ft on RA, independent, brisk, steady gait, tolerated well. Pt with mild DOE, denies any other complaints, declined rest stop. Completed cardiac surgery discharge education. Reviewed restrictions, activity progression, exercise, heart healthy diet, daily weights and phase 2 cardiac rehab. Pt verbalized understanding, agrees to phase 2 cardiac rehab referral, will send to [facility] per pt request. Pt to recliner after walk, call bell within reach. Will follow.  [phone] Robin Sciara, RN, BSN 10/18/2016 3:02 PM

## 2016-10-19 ENCOUNTER — Encounter (HOSPITAL_COMMUNITY)
Admission: RE | Disposition: A | Discharge status: Home / Self Care | Payer: Self-pay | Source: Ambulatory Visit | Attending: Cardiovascular Disease

## 2016-10-19 ENCOUNTER — Encounter (HOSPITAL_COMMUNITY): Payer: Self-pay | Admitting: Cardiovascular Disease

## 2016-10-19 DIAGNOSIS — Z952 Presence of prosthetic heart valve: Secondary | ICD-10-CM

## 2016-10-19 DIAGNOSIS — I442 Atrioventricular block, complete: Secondary | ICD-10-CM

## 2016-10-19 LAB — BASIC METABOLIC PANEL
ANION GAP: 8 (ref 5–15)
BUN: 15 mg/dL (ref 6–20)
CALCIUM: 8.9 mg/dL (ref 8.9–10.3)
CO2: 23 mmol/L (ref 22–32)
Chloride: 108 mmol/L (ref 101–111)
Creatinine, Ser: 0.9 mg/dL (ref 0.44–1.00)
GFR calc Af Amer: >60 mL/min (ref >60)
GFR, EST NON AFRICAN AMERICAN: 60 mL/min — AB (ref >60)
Glucose, Bld: 114 mg/dL — ABNORMAL HIGH (ref 65–99)
POTASSIUM: 4.4 mmol/L (ref 3.5–5.1)
SODIUM: 139 mmol/L (ref 135–145)

## 2016-10-19 LAB — CBC
HEMATOCRIT: 37.5 % (ref 36.0–46.0)
Hemoglobin: 11.9 g/dL — ABNORMAL LOW (ref 12.0–15.0)
MCH: 29.7 pg (ref 26.0–34.0)
MCHC: 31.7 g/dL (ref 30.0–36.0)
MCV: 93.5 fL (ref 78.0–100.0)
Platelets: 94 10*3/uL — ABNORMAL LOW (ref 150–400)
RBC: 4.01 MIL/uL (ref 3.87–5.11)
RDW: 13.2 % (ref 11.5–15.5)
WBC: 5.5 10*3/uL (ref 4.0–10.5)

## 2016-10-19 SURGERY — TEMPORARY PACEMAKER INSERTION

## 2016-10-19 SURGERY — TEMPORARY PACEMAKER
Anesthesia: LOCAL

## 2016-10-19 MED ORDER — DOPAMINE-DEXTROSE 3.2-5 MG/ML-% IV SOLN
INTRAVENOUS | Status: AC
  Filled 2016-10-19: qty 250

## 2016-10-19 MED ORDER — SODIUM CHLORIDE 0.9 % IV SOLN
INTRAVENOUS | Status: AC | PRN
  Administered 2016-10-19: 10 mL/h via INTRAVENOUS

## 2016-10-19 MED ORDER — MORPHINE SULFATE (PF) 10 MG/ML IV SOLN
2.0000 mg | Freq: Once | INTRAVENOUS | Status: AC
  Administered 2016-10-19: 2 mg via INTRAVENOUS

## 2016-10-19 MED ORDER — CEFAZOLIN SODIUM-DEXTROSE 2-4 GM/100ML-% IV SOLN
2.0000 g | INTRAVENOUS | Status: AC
  Administered 2016-10-20: 2 g via INTRAVENOUS
  Filled 2016-10-19: qty 100

## 2016-10-19 MED ORDER — LIDOCAINE HCL 2 % IJ SOLN
INTRAMUSCULAR | Status: AC
  Filled 2016-10-19: qty 10

## 2016-10-19 MED ORDER — SODIUM CHLORIDE 0.9 % IV SOLN
INTRAVENOUS | Status: DC
  Administered 2016-10-20: 06:00:00 via INTRAVENOUS

## 2016-10-19 MED ORDER — CHLORHEXIDINE GLUCONATE 4 % EX LIQD
60.0000 mL | Freq: Once | CUTANEOUS | Status: AC
  Administered 2016-10-19: 4 via TOPICAL

## 2016-10-19 MED ORDER — SODIUM CHLORIDE 0.45 % IV SOLN
INTRAVENOUS | Status: DC
  Administered 2016-10-20: 06:00:00 via INTRAVENOUS

## 2016-10-19 MED ORDER — HEPARIN (PORCINE) IN NACL 2-0.9 UNIT/ML-% IJ SOLN
INTRAMUSCULAR | Status: AC
  Filled 2016-10-19: qty 1000

## 2016-10-19 MED ORDER — CHLORHEXIDINE GLUCONATE 4 % EX LIQD
60.0000 mL | Freq: Once | CUTANEOUS | Status: AC
  Administered 2016-10-20: 4 via TOPICAL
  Filled 2016-10-19: qty 15

## 2016-10-19 MED ORDER — MORPHINE SULFATE (PF) 4 MG/ML IV SOLN
INTRAVENOUS | Status: AC
Start: 2016-10-19 — End: 2016-10-19
  Filled 2016-10-19: qty 1

## 2016-10-19 MED ORDER — LIDOCAINE HCL 2 % IJ SOLN
INTRAMUSCULAR | Status: DC | PRN
  Administered 2016-10-19: 10 mL via INTRADERMAL

## 2016-10-19 MED ORDER — MORPHINE SULFATE (PF) 4 MG/ML IV SOLN
2.0000 mg | Freq: Once | INTRAVENOUS | Status: AC
  Administered 2016-10-19: 2 mg via INTRAVENOUS
  Filled 2016-10-19: qty 1

## 2016-10-19 MED ORDER — HEPARIN (PORCINE) IN NACL 2-0.9 UNIT/ML-% IJ SOLN
INTRAMUSCULAR | Status: AC | PRN
  Administered 2016-10-19: 500 mL

## 2016-10-19 MED ORDER — MORPHINE SULFATE (PF) 10 MG/ML IV SOLN: 2.0000 mg | Freq: Once | INTRAVENOUS | Status: DC

## 2016-10-19 MED ORDER — ATROPINE SULFATE 1 MG/10ML IJ SOSY: 1.0000 mg | PREFILLED_SYRINGE | INTRAMUSCULAR | Status: DC | PRN

## 2016-10-19 MED ORDER — SODIUM CHLORIDE 0.9% FLUSH: 10.0000 mL | INTRAVENOUS | Status: DC | PRN

## 2016-10-19 MED ORDER — SODIUM CHLORIDE 0.9% FLUSH
10.0000 mL | Freq: Two times a day (BID) | INTRAVENOUS | Status: DC
  Administered 2016-10-19 (×2): 10 mL

## 2016-10-19 MED ORDER — DOPAMINE-DEXTROSE 3.2-5 MG/ML-% IV SOLN
2.0000 ug/kg/min | INTRAVENOUS | Status: DC
  Administered 2016-10-19: 2 ug/kg/min via INTRAVENOUS

## 2016-10-19 MED ORDER — HEPARIN (PORCINE) IN NACL 2-0.9 UNIT/ML-% IJ SOLN
INTRAMUSCULAR | Status: AC
  Filled 2016-10-19: qty 500

## 2016-10-19 MED ORDER — HEPARIN (PORCINE) IN NACL 2-0.9 UNIT/ML-% IJ SOLN
INTRAMUSCULAR | Status: DC | PRN
  Administered 2016-10-19: 500 mL

## 2016-10-19 MED ORDER — SODIUM CHLORIDE 0.9 % IR SOLN
80.0000 mg | Status: AC
  Administered 2016-10-20: 80 mg
  Filled 2016-10-19: qty 2

## 2016-10-19 MED FILL — Medication: Qty: 1 | Status: AC

## 2016-10-19 SURGICAL SUPPLY — 8 items
CABLE ADAPT CONN TEMP 6FT (ADAPTER) ×6 IMPLANT
CATH S G BIP PACING (SET/KITS/TRAYS/PACK) ×9 IMPLANT
COVER PRB 48X5XTLSCP FOLD TPE (BAG) ×4 IMPLANT
COVER PROBE 5X48 (BAG) ×2
PACK CARDIAC CATHETERIZATION (CUSTOM PROCEDURE TRAY) ×3 IMPLANT
SHEATH PINNACLE 6F 10CM (SHEATH) ×6 IMPLANT
SLEEVE REPOSITIONING LENGTH 30 (MISCELLANEOUS) ×6 IMPLANT
TUBING CIL FLEX 10 FLL-RA (TUBING) ×3 IMPLANT

## 2016-10-19 NOTE — Progress Notes (Signed)
    Brief Interventional note:   Contacted by on-call fellow re: pt s/p TAVR recently transferred to CCU for worsening bradycardia with high degree AVB.  Was started on transcutaneous pacing with sedation - but not tolerating well.   I was contacted to consider Temporary Pacemaker placement.  I recommended trying Dopamine for rate support, but agreed that with prolonged 8-10 sec pauses & intermittent ~near syncope, TPM is warranted.  Upon my arrival to the CCU - she is in 3rd Deg AVB with NSR & ~junctional/ventricular Escape rhythm rate in 50-60 on Dopamine gtt.  She seems stable, but uncomfortable.    I discussed the Temp Wire procedure in detail with the patient & daughter (as did Dr. Mliss Sax).  They agree to proceed.  Cath Lab Team called in -- Plan L IJ Temp wire placement for 3rd Deg AVB.   Glenetta Hew, MD

## 2016-10-19 NOTE — Plan of Care (Signed)
Pt had 12 second or more pulse at this time. Pauley stated to get 12 lead EKG and he would come to see the pt. Pt was symptomatic. Pt became diaphoretic.

## 2016-10-19 NOTE — Interval H&P Note (Signed)
History and Physical Interval Note:  10/19/2016 6:10 AM  Robin Sutton  has presented today for surgery, with the diagnosis of STEMI  The various methods of treatment have been discussed with the patient and family. After consideration of risks, benefits and other options for treatment, the patient has consented to  Procedure(s): Temporary Pacemaker Insertion (Left) as a surgical intervention .  The patient's history has been reviewed, patient examined, no change in status, stable for surgery.  I have reviewed the patient's chart and labs.  Questions were answered to the patient's satisfaction.     Glenetta Hew

## 2016-10-19 NOTE — Brief Op Note (Addendum)
   BRIEF OP NOTE - TEMPORARY PACEMAKER PLACEMENT  10/17/2016 - 10/19/2016  6:14 AM  PATIENT:  Robin Sutton  81 y.o. female who is s/p TAVR on 10/17/2016 for severe AS.  Was doing well until last PM 10/12 when she began to develop high grade AV Block with frequent prolonged pauses & syncope. She was transferred to CCU & placed on Transcutaneous Pacing with backup rate of 30 bpm - however she was quite uncomfortable & not tolerating it well.  I was asked to assist with Transvenous Pacer Placement.  PRE-OPERATIVE DIAGNOSIS:  COMPLETE HEART BLOCK  POST-OPERATIVE DIAGNOSIS:  Succssful Precarious insertion of Temp Pacer via L IJ -- Very difficult & tenuous with difficulty inserting the catheter & achieving capture.  PROCEDURE:  Procedure(s): Temporary Pacemaker Insertion (Left)   L IJ prepped & draped in sterile fashion.  IJ identified with direct ultrasound imaging - permanent imaging captured & placed in chart.  L IJ 6 Fr sheath placed using US Guidance & Modified Seldinger Technique.  Sheath aspirated & flushed (wire location confirmed fluroscopically)  5 Fr Temp pacer (on 2nd catheter /3rd attempt) finally advanced to a stable location at the RVOT with sucessful capture.  During insertion, the patient on several occasions required Transcutaneous Pacing for episodes of altered consciousness & prolonged pauses.  Never had full LOC.  Once positioned, the lead thresholds were tested - 37mA, rate 70 bpm.  38 cm.  The sheath was sutured in place with repositioning sleeve placed.  The Line was then dressed in sterile fashion with KVO line attached to side port.  -- After the completion of the initial part of the procedure, with movement the catheter dislodged and no longer captured. I therefore decided to switch to femoral access for better pacemaker placement location. Marland Kitchen -- The patient was reprepped and draped in a sterile fashion with left groin sterilely prepped.  The femoral head was  identified fluoroscopically and the groin was infiltrated with subcutaneous lidocaine under direct ultrasound guidance. The left common femoral vein was then accessed using a similar technique with Seldinger technique and ultrasound guidance. A 6 French sheath was placed. A second 5 [place] temporary pacemaker catheter was then advanced under fluoroscopic guidance again with difficult placement I was able to finally achieve capture. However during this interval, the patient herself into a more sinus rhythm with frequent ectopy. We wean the dopamine fully off.  New temporary wire thresholds were set at 0.5 mA. Settings: Backup rate 50, 5 mA; ~51 cm The initial left IJ temporary wire was then removed under fluoroscopic guidance to ensure no dislodging of the new femoral line.  The patient remained hemodynamically stable during the procedure.   SURGEON:  Surgeon(s) and Role:    * Leonie Man, MD - Primary  ANESTHESIA:   local  EBL:  < 10 mL  LOCAL MEDICATIONS USED:  LIDOCAINE 7 mL  DICTATION: .Note written in [place]: Return to CCU for monitoring -- Consider EP consult for considering Permanent Pacer vs. Temp-Perm given the precarios nature of the current line.  PATIENT DISPOSITION:  ICU - extubated and stable.   Glenetta Hew, M.D., M.S. Interventional Cardiologist   Pager # [phone] Phone # [phone] [address] [place], [place] [address]

## 2016-10-19 NOTE — Progress Notes (Signed)
Strips reviewed from monitor. Pt had 6 second, 8second, and 30 second pulses. Pauley at bedside.

## 2016-10-19 NOTE — Significant Event (Signed)
Rapid Response Event Note RN called for a 7 second pause  Overview: Time Called: 9826 Arrival Time: 0155 Event Type: Cardiac  Initial Focused Assessment: On arrival pt lying supine in bed, on a NRB, alert and oriented x3, skin warm and dry, BP 141/78, HR 40-50's, RR 16, 100% NRB. Pt had several 12-14 second pauses associated with syncopal episodes. Dr. Mliss Sax at bedside.   Interventions: Atropine x1 given. EKG showed 2nd Heart Block. Previous EKG showed LBBB    Event Summary: Name of Physician Notified: Dr. Mliss Sax  at [phone]    at    Outcome: Transferred (Comment) (2h14)     Gevena Mart, Sela Hua

## 2016-10-19 NOTE — Consult Note (Signed)
ELECTROPHYSIOLOGY CONSULT NOTE    Primary Care Physician: Maylon Cos, NP Referring Physician:  Admit Date: 10/17/2016  Reason for consultation:  Dr Ellyn Hack requests EP consultation for AV block   Robin Sutton is a 81 y.o. female with a h/o severe aortic stenosis s/p TAVR 10/17/16 with a Medtronic Evolut Pro valve.  She has developed progressive AV block post operatively.  Overnight, her narrow qrs evolved to LBBB and subsequently complete heart block.  She required urgent temporary wire placement by Dr Ellyn Hack.  She is now much more stable.  Currently, she denies CP, SOB, or other concerns.    Past Medical History:  Diagnosis Date  . History of CVA (cerebrovascular accident)   . HLD (hyperlipidemia)   . Hypertension   . S/P TAVR (transcatheter aortic valve replacement) 10/17/2016   23 mm Medtronic Evolut Pro transcatheter heart valve placed via percutaneous right transfemoral approach    Past Surgical History:  Procedure Laterality Date  . BREAST BIOPSY    . RIGHT/LEFT HEART CATH AND CORONARY ANGIOGRAPHY N/A 09/11/2016   Procedure: RIGHT/LEFT HEART CATH AND CORONARY ANGIOGRAPHY;  Surgeon: Sherren Mocha, MD;  Location: [facility];  Service: Cardiovascular;  Laterality: N/A;  . TEE WITHOUT CARDIOVERSION N/A 10/17/2016   Procedure: TRANSESOPHAGEAL ECHOCARDIOGRAM (TEE);  Surgeon: Sherren Mocha, MD;  Location: [facility];  Service: Open Heart Surgery;  Laterality: N/A;  . TONSILLECTOMY    . TRANSCATHETER AORTIC VALVE REPLACEMENT, TRANSFEMORAL N/A 10/17/2016   Procedure: TRANSCATHETER AORTIC VALVE REPLACEMENT, TRANSFEMORAL;  Surgeon: Sherren Mocha, MD;  Location: [facility];  Service: Open Heart Surgery;  Laterality: N/A;    . acetaminophen  1,000 mg Oral Q6H  . aspirin EC  81 mg Oral Daily  . clopidogrel  75 mg Oral Q breakfast  . morphine      . pantoprazole  40 mg Oral Daily  . pravastatin  40 mg Oral QPM  . sodium chloride flush  10-40 mL Intracatheter Q12H  .  [START ON 10/21/2016] Vitamin D (Ergocalciferol)  50,000 Units Oral Q Mon   . DOPamine Stopped (10/19/16 0708)  . DOPamine Stopped (10/19/16 0730)  . lactated ringers      Allergies  Allergen Reactions  . Adhesive [Tape] Dermatitis    Skin Irritation    Social History   Social History  . Marital status: Widowed    Spouse name: N/A  . Number of children: N/A  . Years of education: N/A   Occupational History  . Not on file.   Social History Main Topics  . Smoking status: Never Smoker  . Smokeless tobacco: Never Used  . Alcohol use No  . Drug use: No  . Sexual activity: Not on file   Other Topics Concern  . Not on file   Social History Narrative  . No narrative on file    Family History  Problem Relation Age of Onset  . Hypertension Mother   . Heart disease Mother   . Stroke Mother   . Kidney failure Father   . Hypertension Brother   . Heart disease Brother   . Heart disease Maternal Grandfather   . Diabetes Maternal Grandfather     ROS- All systems are reviewed and negative except as per the HPI above  Physical Exam: Telemetry:  Sinus rhythm with complete heart block and V pacing Vitals:   10/19/16 0815 10/19/16 0830 10/19/16 0900 10/19/16 0913  BP: (!) 81/58 99/64 (!) 119/50   Pulse: 73 68 70   Resp: 15 (!)  30 (!) 22   Temp:    98.3 F (36.8 C)  TempSrc:    Oral  SpO2: 99% 99% 97%   Weight:      Height:        GEN- The patient is elderly and ill appearing, alert and oriented x 3 today.   Head- normocephalic, atraumatic Eyes-  Sclera clear, conjunctiva pink Ears- hearing intact Oropharynx- clear Neck- supple, L IJ catheter placed by Dr Ellyn Hack in place Lymph- no cervical lymphadenopathy Lungs- Clear to ausculation bilaterally, normal work of breathing Heart- Regular rate and rhythm (paced) GI- soft, NT, ND, + BS Extremities- no clubbing, cyanosis, or edema,  L femoral venous temporary pacing wire in place MS- no significant deformity or  atrophy Skin- no rash or lesion Psych- euthymic mood, full affect Neuro- strength and sensation are intact  EKGs are reviewed.  Initial ekg reveals sinus rhythm with narrow qrs.  Subsequent ekg reveals sinus rhythm with LBBB  Labs:   Lab Results  Component Value Date   WBC 5.5 10/19/2016   HGB 11.9 (L) 10/19/2016   HCT 37.5 10/19/2016   MCV 93.5 10/19/2016   PLT 94 (L) 10/19/2016    Recent Labs Lab 10/17/16 0625  10/19/16 0218  NA 139  < > 139  K 4.4  < > 4.4  CL 107  < > 108  CO2 23  < > 23  BUN 14  < > 15  CREATININE 0.89  < > 0.90  CALCIUM 9.4  < > 8.9  PROT 6.0*  --   --   BILITOT 0.9  --   --   ALKPHOS 61  --   --   ALT 16  --   --   AST 22  --   --   GLUCOSE 106*  < > 114*  < > = values in this interval not displayed. No results found for: CKTOTAL, CKMB, CKMBINDEX, TROPONINI No results found for: CHOL No results found for: HDL No results found for: LDLCALC No results found for: TRIG No results found for: CHOLHDL No results found for: LDLDIRECT    Radiology: cxr 10/17/16 reveals L basilar subsegmental atelectasis  Echo:  EF 70%  ASSESSMENT AND PLAN:   1. Complete heart block The patient has symptomatic complete heart block post TAVR.   Dr Burt Knack and I have spoken at length regarding the patient.  Her AV conduction is not expected to improve.  I would therefore recommend pacemaker implantation at this time.  Risks, benefits, alternatives to pacemaker implantation were discussed in detail with the patient today. The patient understands that the risks and wishes to proceed. We will therefore schedule the procedure at the next available time.  She is tentatively scheduled to have the procedure tomorrow morning at 8am.  I have made NPO after midnight.  2. S/p TAVR for severe aortic stenosis Doing well post TAVR  Anticipate transfer to telemetry floor post PPM tomorrow Hopefully home on Monday  Thompson Grayer, MD 10/19/2016  9:33 AM

## 2016-10-19 NOTE — Progress Notes (Signed)
Progress Note  Patient Name: Robin Sutton Date of Encounter: 10/19/2016  Primary Cardiologist: Agustin Cree  Subjective   The patient is feeling better now that her temporary pacing wire has been placed. Rocky night with near syncope and high-grade heart block. No chest pain or shortness of breath at present.  Inpatient Medications    Scheduled Meds: . acetaminophen  1,000 mg Oral Q6H  . aspirin EC  81 mg Oral Daily  . clopidogrel  75 mg Oral Q breakfast  . morphine      . pantoprazole  40 mg Oral Daily  . pravastatin  40 mg Oral QPM  . sodium chloride flush  10-40 mL Intracatheter Q12H  . [START ON 10/21/2016] Vitamin D (Ergocalciferol)  50,000 Units Oral Q Mon   Continuous Infusions: . DOPamine Stopped (10/19/16 0708)  . DOPamine Stopped (10/19/16 0730)  . lactated ringers     PRN Meds: atropine, diphenhydrAMINE, lactated ringers, ondansetron (ZOFRAN) IV, sodium chloride flush   Vital Signs    Vitals:   10/19/16 1106 10/19/16 1200 10/19/16 1300 10/19/16 1400  BP:  (!) 109/56 124/61 125/71  Pulse:  74 67 71  Resp:  20 (!) 24 17  Temp: 97.9 F (36.6 C)     TempSrc: Oral     SpO2:  95% 98% 97%  Weight:      Height:        Intake/Output Summary (Last 24 hours) at 10/19/16 1508 Last data filed at 10/19/16 1300  Gross per 24 hour  Intake           484.24 ml  Output              300 ml  Net           184.24 ml   Filed Weights   10/17/16 1100 10/19/16 0800  Weight: 182 lb 8.7 oz (82.8 kg) 182 lb 8.7 oz (82.8 kg)    Telemetry    High-grade AV block with periods of asystole now ventricular paced - Personally Reviewed   Physical Exam  Pleasant, alert, oriented woman in no distress GEN: No acute distress.   Neck: No JVD Cardiac: RRR, 2/6 systolic murmur at the right upper sternal border, no diastolic murmur Respiratory: Clear to auscultation bilaterally. GI: Soft, nontender, non-distended  MS: No edema; No deformity.  bilateral groin sites are  clear Neuro:  Nonfocal  Psych: Normal affect   Labs    Chemistry Recent Labs Lab 10/17/16 0625  10/17/16 0946 10/17/16 1027 10/18/16 0452 10/19/16 0218  NA 139  < > 141 143 137 139  K 4.4  < > 4.2 4.1 4.0 4.4  CL 107  < > 105  --  105 108  CO2 23  --   --   --  26 23  GLUCOSE 106*  < > 138* 127* 110* 114*  BUN 14  < > 13  --  16 15  CREATININE 0.89  < > 0.70  --  0.93 0.90  CALCIUM 9.4  --   --   --  9.0 8.9  PROT 6.0*  --   --   --   --   --   ALBUMIN 4.0  --   --   --   --   --   AST 22  --   --   --   --   --   ALT 16  --   --   --   --   --  ALKPHOS 61  --   --   --   --   --   BILITOT 0.9  --   --   --   --   --   GFRNONAA >60  --   --   --  57* 60*  GFRAA >60  --   --   --  >60 >60  ANIONGAP 9  --   --   --  6 8  < > = values in this interval not displayed.   Hematology Recent Labs Lab 10/17/16 1022 10/17/16 1027 10/18/16 0452 10/19/16 0218  WBC 4.0  --  5.7 5.5  RBC 3.89  --  3.76* 4.01  HGB 11.6* 11.2* 11.2* 11.9*  HCT 36.1 33.0* 35.2* 37.5  MCV 92.8  --  93.6 93.5  MCH 29.8  --  29.8 29.7  MCHC 32.1  --  31.8 31.7  RDW 13.3  --  13.3 13.2  PLT 120*  --  105* 94*    Cardiac EnzymesNo results for input(s): TROPONINI in the last 168 hours. No results for input(s): TROPIPOC in the last 168 hours.   BNPNo results for input(s): BNP, PROBNP in the last 168 hours.   DDimer No results for input(s): DDIMER in the last 168 hours.   Radiology    No results found.  Cardiac Studies   2-D echo: Left ventricle:  The cavity size was normal. There was mild concentric hypertrophy. Systolic function was hyperdynamic. The estimated ejection fraction was in the range of 70% to 75%. There was no dynamic obstruction. Wall motion was normal; there were no regional wall motion abnormalities. Features are consistent with a pseudonormal left ventricular filling pattern, with concomitant abnormal relaxation and increased filling pressure (grade 2 diastolic  dysfunction).  ------------------------------------------------------------------- Aortic valve:  A stent-valve (TAVR) bioprosthesis was present. Doppler:  Transvalvular velocity was increased more than expected, due to high cardiac output. There was mild stenosis. There was no significant regurgitation.    VTI ratio of LVOT to aortic valve: 0.6. Valve area (VTI): 1.52 cm^2. Indexed valve area (VTI): 0.78 cm^2/m^2. Peak velocity ratio of LVOT to aortic valve: 0.46. Valve area (Vmax): 1.16 cm^2. Indexed valve area (Vmax): 0.59 cm^2/m^2. Mean velocity ratio of LVOT to aortic valve: 0.47. Valve area (Vmean): 1.19 cm^2. Indexed valve area (Vmean): 0.61 cm^2/m^2. Mean gradient (S): 23 mm Hg. Peak gradient (S): 39 mm Hg.  ------------------------------------------------------------------- Aorta:  Aortic root: The aortic root was normal in size. Ascending aorta: The ascending aorta was normal in size.  ------------------------------------------------------------------- Mitral valve:   Calcified annulus. Leaflet separation was normal. Doppler:  Transvalvular velocity was within the normal range. There was no evidence for stenosis. There was trivial regurgitation. Peak gradient (D): 4 mm Hg.  ------------------------------------------------------------------- Left atrium:  The atrium was mildly dilated.  ------------------------------------------------------------------- Atrial septum:  No defect or patent foramen ovale was identified.   ------------------------------------------------------------------- Right ventricle:  The cavity size was normal. Systolic function was normal.  ------------------------------------------------------------------- Pulmonic valve:   Poorly visualized.  The valve appears to be grossly normal.    Doppler:  There was no significant regurgitation.  ------------------------------------------------------------------- Tricuspid valve:   Structurally  normal valve.   Leaflet separation was normal.  Doppler:  Transvalvular velocity was within the normal range. There was trivial regurgitation.  ------------------------------------------------------------------- Pulmonary artery:   Systolic pressure was mildly increased.  ------------------------------------------------------------------- Right atrium:  The atrium was normal in size.  ------------------------------------------------------------------- Pericardium:  A trivial pericardial effusion was identified.  ------------------------------------------------------------------- Systemic veins:  Inferior vena cava: The vessel was normal in size. The respirophasic diameter changes were in the normal range (= 50%), consistent with normal central venous pressure.   Patient Profile     81 y.o. female with a history of HTN, previous CVA and severe AS who presented to [facility] on 10/17/16 for planned TAVR.   Assessment & Plan    1. Severe aortic stenosis: Patient status post TAVR now postoperative day #2. Moderately elevated gradient relate hyperdynamic LV function and small aortic valve annulus.continue aspirin and Plavix.  2. Complete heart block/New Left Bundle branch block: Patient required temporary pacemaker placement overnight. Appreciate EP evaluation. Plans for permanent pacemaker tomorrow. Discussed her case with Dr. Rayann Heman.  3. Previous stroke: stable. On antiplatelet Rx.   Disposition: Keep in CCU. Tentatively plan for permanent pacemaker plant tomorrow morning.  For questions or updates, please contact [place] Please consult [email or web address] for contact info under Cardiology/STEMI.      Deatra James, MD  10/19/2016, 3:08 PM

## 2016-10-19 NOTE — Progress Notes (Signed)
Zoll pacer in place at this time.

## 2016-10-19 NOTE — H&P (View-Only) (Signed)
    Brief Interventional note:   Contacted by on-call fellow re: pt s/p TAVR recently transferred to CCU for worsening bradycardia with high degree AVB.  Was started on transcutaneous pacing with sedation - but not tolerating well.   I was contacted to consider Temporary Pacemaker placement.  I recommended trying Dopamine for rate support, but agreed that with prolonged 8-10 sec pauses & intermittent ~near syncope, TPM is warranted.  Upon my arrival to the CCU - she is in 3rd Deg AVB with NSR & ~junctional/ventricular Escape rhythm rate in 50-60 on Dopamine gtt.  She seems stable, but uncomfortable.    I discussed the Temp Wire procedure in detail with the patient & daughter (as did Dr. Mliss Sax).  They agree to proceed.  Cath Lab Team called in -- Plan L IJ Temp wire placement for 3rd Deg AVB.   Glenetta Hew, MD

## 2016-10-19 NOTE — Progress Notes (Signed)
Pt had 7 second pause at 0150.  Charge nurse went into room and checked on patient.  Patient was ok and when charge nurse left the room, blue phone rang and secretary summoned this Probation officer and other nurses on the floor to the patient's room.  Patient was diaphoretic and pale.  Saved cardiac strip revealed 12.13 sec pause and EKG revealed complete heart block.  MD notified and arrived to floor to see patient.  Patient blood pressure 141/75 and HR 40s and 50s in junction vs SR.  MD made decision to transfer patient to Coronary ICU.  Report was called and patient was transported to coronary ICU with zoll pads and zoll monitor in place.  Patient arrived on coronary ICU in stable condition, moved to ICU bed and placed on cardiac monitoring.

## 2016-10-19 NOTE — Progress Notes (Signed)
There was a total of 4 MG of Morphine given IV.

## 2016-10-20 ENCOUNTER — Encounter (HOSPITAL_COMMUNITY)
Admission: RE | Disposition: A | Discharge status: Home / Self Care | Payer: Self-pay | Source: Ambulatory Visit | Attending: Cardiovascular Disease

## 2016-10-20 HISTORY — PX: PACEMAKER IMPLANT: EP1218

## 2016-10-20 LAB — SURGICAL PCR SCREEN
MRSA, PCR: NEGATIVE
STAPHYLOCOCCUS AUREUS: NEGATIVE

## 2016-10-20 LAB — BASIC METABOLIC PANEL
Anion gap: 7 (ref 5–15)
BUN: 11 mg/dL (ref 6–20)
CALCIUM: 8.8 mg/dL — AB (ref 8.9–10.3)
CO2: 24 mmol/L (ref 22–32)
Chloride: 109 mmol/L (ref 101–111)
Creatinine, Ser: 0.84 mg/dL (ref 0.44–1.00)
Glucose, Bld: 109 mg/dL — ABNORMAL HIGH (ref 65–99)
POTASSIUM: 3.8 mmol/L (ref 3.5–5.1)
Sodium: 140 mmol/L (ref 135–145)

## 2016-10-20 SURGERY — PACEMAKER IMPLANT
Anesthesia: LOCAL

## 2016-10-20 MED ORDER — CEFAZOLIN SODIUM-DEXTROSE 2-4 GM/100ML-% IV SOLN
INTRAVENOUS | Status: AC
  Filled 2016-10-20: qty 100

## 2016-10-20 MED ORDER — BUPIVACAINE HCL (PF) 0.25 % IJ SOLN
INTRAMUSCULAR | Status: AC
  Filled 2016-10-20: qty 30

## 2016-10-20 MED ORDER — FENTANYL CITRATE (PF) 100 MCG/2ML IJ SOLN
INTRAMUSCULAR | Status: DC | PRN
  Administered 2016-10-20: 12.5 ug via INTRAVENOUS

## 2016-10-20 MED ORDER — BUPIVACAINE HCL (PF) 0.25 % IJ SOLN
INTRAMUSCULAR | Status: DC | PRN
  Administered 2016-10-20: 40 mL

## 2016-10-20 MED ORDER — HYDROCODONE-ACETAMINOPHEN 5-325 MG PO TABS: 1.0000 | ORAL_TABLET | ORAL | Status: DC | PRN

## 2016-10-20 MED ORDER — CEFAZOLIN SODIUM-DEXTROSE 1-4 GM/50ML-% IV SOLN
1.0000 g | Freq: Four times a day (QID) | INTRAVENOUS | Status: AC
  Administered 2016-10-20 – 2016-10-21 (×3): 1 g via INTRAVENOUS
  Filled 2016-10-20 (×3): qty 50

## 2016-10-20 MED ORDER — GENTAMICIN SULFATE 40 MG/ML IJ SOLN
INTRAMUSCULAR | Status: AC
  Filled 2016-10-20: qty 2

## 2016-10-20 MED ORDER — MIDAZOLAM HCL 5 MG/5ML IJ SOLN
INTRAMUSCULAR | Status: DC | PRN
  Administered 2016-10-20 (×2): 1 mg via INTRAVENOUS

## 2016-10-20 MED ORDER — ONDANSETRON HCL 4 MG/2ML IJ SOLN: 4.0000 mg | Freq: Four times a day (QID) | INTRAMUSCULAR | Status: DC | PRN

## 2016-10-20 MED ORDER — SODIUM CHLORIDE 0.9% FLUSH: 3.0000 mL | INTRAVENOUS | Status: DC | PRN

## 2016-10-20 MED ORDER — MIDAZOLAM HCL 5 MG/5ML IJ SOLN
INTRAMUSCULAR | Status: AC
  Filled 2016-10-20: qty 5

## 2016-10-20 MED ORDER — SODIUM CHLORIDE 0.9 % IV SOLN: 250.0000 mL | INTRAVENOUS | Status: DC | PRN

## 2016-10-20 MED ORDER — HEPARIN (PORCINE) IN NACL 2-0.9 UNIT/ML-% IJ SOLN
INTRAMUSCULAR | Status: AC | PRN
  Administered 2016-10-20: 500 mL

## 2016-10-20 MED ORDER — ACETAMINOPHEN 325 MG PO TABS
325.0000 mg | ORAL_TABLET | ORAL | Status: DC | PRN
  Administered 2016-10-20 – 2016-10-21 (×2): 650 mg via ORAL
  Filled 2016-10-20 (×2): qty 2

## 2016-10-20 MED ORDER — FENTANYL CITRATE (PF) 100 MCG/2ML IJ SOLN
INTRAMUSCULAR | Status: AC
  Filled 2016-10-20: qty 2

## 2016-10-20 MED ORDER — HEPARIN (PORCINE) IN NACL 2-0.9 UNIT/ML-% IJ SOLN
INTRAMUSCULAR | Status: AC
  Filled 2016-10-20: qty 500

## 2016-10-20 MED ORDER — SODIUM CHLORIDE 0.9% FLUSH
3.0000 mL | Freq: Two times a day (BID) | INTRAVENOUS | Status: DC
  Administered 2016-10-21: 3 mL via INTRAVENOUS

## 2016-10-20 SURGICAL SUPPLY — 9 items
CABLE SURGICAL S-101-97-12 (CABLE) ×2 IMPLANT
LEAD TENDRIL MRI 46CM LPA1200M (Lead) ×2 IMPLANT
LEAD TENDRIL MRI 58CM LPA1200M (Lead) ×2 IMPLANT
PACEMAKER ASSURITY DR-RF (Pacemaker) ×2 IMPLANT
PACK EP LATEX FREE (CUSTOM PROCEDURE TRAY) ×1
PACK EP LF (CUSTOM PROCEDURE TRAY) ×1 IMPLANT
PAD DEFIB LIFELINK (PAD) ×2 IMPLANT
SHEATH CLASSIC 8F (SHEATH) ×4 IMPLANT
TRAY PACEMAKER INSERTION (PACKS) ×2 IMPLANT

## 2016-10-20 NOTE — Interval H&P Note (Signed)
History and Physical Interval Note:  10/20/2016 8:59 AM  Robin Sutton  has presented today for surgery, with the diagnosis of av block  The various methods of treatment have been discussed with the patient and family. After consideration of risks, benefits and other options for treatment, the patient has consented to  Procedure(s): PACEMAKER IMPLANT (N/A) as a surgical intervention .  The patient's history has been reviewed, patient examined, no change in status, stable for surgery.  I have reviewed the patient's chart and labs.  Questions were answered to the patient's satisfaction.     Thompson Grayer

## 2016-10-20 NOTE — Progress Notes (Signed)
TAVR Team Rounding note: Patient is now status post permanent pacemaker placement by Dr. Rayann Heman this morning. She is doing well and her family is at the bedside. She is about to go for a walk with the RN. Currently a sensed V paced at about 100 bpm. Hopefully she should be ready for discharge tomorrow.  Appreciate care of the EP team.   Sherren Mocha 10/20/2016 4:35 PM

## 2016-10-20 NOTE — Progress Notes (Signed)
Electrophysiology Rounding Note  Patient Name: Robin Sutton Date of Encounter: 10/21/2016  Primary Cardiologist: Agustin Cree Structural Heart: Burt Knack Electrophysiologist: Prophet Renwick   Subjective   The patient is doing well today.  At this time, the patient denies chest pain, shortness of breath, or any new concerns.  Inpatient Medications    Scheduled Meds: . aspirin EC  81 mg Oral Daily  . clopidogrel  75 mg Oral Q breakfast  . pantoprazole  40 mg Oral Daily  . pravastatin  40 mg Oral QPM  . sodium chloride flush  3 mL Intravenous Q12H  . Vitamin D (Ergocalciferol)  50,000 Units Oral Q Mon   Continuous Infusions: . sodium chloride     PRN Meds: sodium chloride, acetaminophen, diphenhydrAMINE, HYDROcodone-acetaminophen, ondansetron (ZOFRAN) IV, sodium chloride flush   Vital Signs    Vitals:   10/21/16 0300 10/21/16 0400 10/21/16 0500 10/21/16 0600  BP: 132/80 112/68 115/64 138/88  Pulse: 85 84 82   Resp: (!) 25 (!) 26 (!) 27 17  Temp:  (!) 96.7 F (35.9 C)    TempSrc:  Oral    SpO2: (!) 89% 97% 97% 100%  Weight:    188 lb 0.8 oz (85.3 kg)  Height:        Intake/Output Summary (Last 24 hours) at 10/21/16 0651 Last data filed at 10/21/16 0026  Gross per 24 hour  Intake              350 ml  Output                0 ml  Net              350 ml   Filed Weights   10/19/16 0800 10/20/16 0413 10/21/16 0600  Weight: 182 lb 8.7 oz (82.8 kg) 186 lb 11.7 oz (84.7 kg) 188 lb 0.8 oz (85.3 kg)    Physical Exam    GEN- The patient is well appearing, alert and oriented x 3 today.   Head- normocephalic, atraumatic Eyes-  Sclera clear, conjunctiva pink Ears- hearing intact Oropharynx- clear Neck- supple Lungs- Clear to ausculation bilaterally, normal work of breathing Heart- Regular rate and rhythm, no murmurs, rubs or gallops GI- soft, NT, ND, + BS Extremities- no clubbing, cyanosis, or edema Skin- no rash or lesion Psych- euthymic mood, full affect Neuro- strength  and sensation are intact  Labs    CBC  Recent Labs  10/19/16 0218  WBC 5.5  HGB 11.9*  HCT 37.5  MCV 93.5  PLT 94*   Basic Metabolic Panel  Recent Labs  10/19/16 0218 10/20/16 0428  NA 139 140  K 4.4 3.8  CL 108 109  CO2 23 24  GLUCOSE 114* 109*  BUN 15 11  CREATININE 0.90 0.84  CALCIUM 8.9 8.8*    Telemetry    Sinus rhythm with V pacing (personally reviewed)  Radiology    No results found.   Patient Profile     Tessi Eustache is a 81 y.o. female admitted for TAVR. She developed complete heart block post op and required urgent temp pacemaker placement. She underwent permanent PPM implant 10/20/16.   Assessment & Plan    1.  Complete heart block s/p TAVR Doing well s/p PPM CXR with stable lead position Device interrogation normal Routine wound care and follow up (entered in AVS)  2.  S/p TAVR Per primary team  Ok from our standpoint to discharge home.  Electrophysiology team to see as needed while here. Please  call with questions.   Signed, Chanetta Marshall, NP  10/21/2016, 6:51 AM   I have seen, examined the patient, and reviewed the above assessment and plan.  Changes to above are made where necessary.  On exam, no pocket hematama, RRR.  Device interrogation is reviewed and normal.  CXR reveals stable leads, no ptx.  Routine wound care and follow-up  Co Sign: Thompson Grayer, MD 10/21/2016 8:44 AM

## 2016-10-20 NOTE — Progress Notes (Signed)
Electrophysiology Rounding Note  Patient Name: Robin Sutton Date of Encounter: 10/20/2016    Subjective   The patient is doing well today.  At this time, the patient denies chest pain, shortness of breath, or any new concerns.  Inpatient Medications    Scheduled Meds: . acetaminophen  1,000 mg Oral Q6H  . aspirin EC  81 mg Oral Daily  . clopidogrel  75 mg Oral Q breakfast  . gentamicin irrigation  80 mg Irrigation To Cath  . pantoprazole  40 mg Oral Daily  . pravastatin  40 mg Oral QPM  . sodium chloride flush  10-40 mL Intracatheter Q12H  . [START ON 10/21/2016] Vitamin D (Ergocalciferol)  50,000 Units Oral Q Mon   Continuous Infusions: . sodium chloride 10 mL/hr at 10/20/16 0620  . sodium chloride 10 mL/hr at 10/20/16 0620  .  ceFAZolin (ANCEF) IV    . DOPamine Stopped (10/19/16 0730)  . lactated ringers     PRN Meds: atropine, diphenhydrAMINE, lactated ringers, ondansetron (ZOFRAN) IV, sodium chloride flush   Vital Signs    Vitals:   10/20/16 0413 10/20/16 0500 10/20/16 0600 10/20/16 0700  BP:  (!) 154/71 (!) 142/70 135/74  Pulse:  77 76 69  Resp:  (!) 23 (!) 26 (!) 28  Temp: 98.3 F (36.8 C)     TempSrc: Oral     SpO2:  96% 93% 93%  Weight: 186 lb 11.7 oz (84.7 kg)     Height:        Intake/Output Summary (Last 24 hours) at 10/20/16 0820 Last data filed at 10/20/16 0651  Gross per 24 hour  Intake           704.34 ml  Output             1050 ml  Net          -345.66 ml   Filed Weights   10/17/16 1100 10/19/16 0800 10/20/16 0413  Weight: 182 lb 8.7 oz (82.8 kg) 182 lb 8.7 oz (82.8 kg) 186 lb 11.7 oz (84.7 kg)    Physical Exam    GEN- The patient is ill appearing, alert and oriented x 3 today.   Head- normocephalic, atraumatic Eyes-  Sclera clear, conjunctiva pink Ears- hearing intact Oropharynx- clear Neck- supple Lungs- decreased L breath sounds, normal work of breathing Heart- Regular rate and rhythm, no murmurs, rubs or gallops GI- soft,  NT, ND, + BS Extremities- no clubbing, cyanosis, or edema Skin- no rash or lesion Psych- euthymic mood, full affect Neuro- strength and sensation are intact  Labs    CBC  Recent Labs  10/18/16 0452 10/19/16 0218  WBC 5.7 5.5  HGB 11.2* 11.9*  HCT 35.2* 37.5  MCV 93.6 93.5  PLT 105* 94*   Basic Metabolic Panel  Recent Labs  10/18/16 0452 10/19/16 0218 10/20/16 0428  NA 137 139 140  K 4.0 4.4 3.8  CL 105 108 109  CO2 26 23 24   GLUCOSE 110* 114* 109*  BUN 16 15 11   CREATININE 0.93 0.90 0.84  CALCIUM 9.0 8.9 8.8*  MG 2.1  --   --      Telemetry    Sinus rhythm with complete heart block (personally reviewed)    Assessment & Plan    1. Complete heart block Persistent post TAVR requiring emergent temporary pacing wire placement Risks, benefits, alternatives to pacemaker implantation were discussed in detail with the patient and her daughter today. They understand that the risks include  but are not limited to bleeding, infection, pneumothorax, perforation, tamponade, vascular damage, renal failure, MI, stroke, death,  and lead dislodgement and wishes to proceed at this time.   Signed, Thompson Grayer, MD  10/20/2016, 8:20 AM

## 2016-10-20 NOTE — H&P (View-Only) (Signed)
Electrophysiology Rounding Note  Patient Name: Robin Sutton Date of Encounter: 10/20/2016    Subjective   The patient is doing well today.  At this time, the patient denies chest pain, shortness of breath, or any new concerns.  Inpatient Medications    Scheduled Meds: . acetaminophen  1,000 mg Oral Q6H  . aspirin EC  81 mg Oral Daily  . clopidogrel  75 mg Oral Q breakfast  . gentamicin irrigation  80 mg Irrigation To Cath  . pantoprazole  40 mg Oral Daily  . pravastatin  40 mg Oral QPM  . sodium chloride flush  10-40 mL Intracatheter Q12H  . [START ON 10/21/2016] Vitamin D (Ergocalciferol)  50,000 Units Oral Q Mon   Continuous Infusions: . sodium chloride 10 mL/hr at 10/20/16 0620  . sodium chloride 10 mL/hr at 10/20/16 0620  .  ceFAZolin (ANCEF) IV    . DOPamine Stopped (10/19/16 0730)  . lactated ringers     PRN Meds: atropine, diphenhydrAMINE, lactated ringers, ondansetron (ZOFRAN) IV, sodium chloride flush   Vital Signs    Vitals:   10/20/16 0413 10/20/16 0500 10/20/16 0600 10/20/16 0700  BP:  (!) 154/71 (!) 142/70 135/74  Pulse:  77 76 69  Resp:  (!) 23 (!) 26 (!) 28  Temp: 98.3 F (36.8 C)     TempSrc: Oral     SpO2:  96% 93% 93%  Weight: 186 lb 11.7 oz (84.7 kg)     Height:        Intake/Output Summary (Last 24 hours) at 10/20/16 0820 Last data filed at 10/20/16 0651  Gross per 24 hour  Intake           704.34 ml  Output             1050 ml  Net          -345.66 ml   Filed Weights   10/17/16 1100 10/19/16 0800 10/20/16 0413  Weight: 182 lb 8.7 oz (82.8 kg) 182 lb 8.7 oz (82.8 kg) 186 lb 11.7 oz (84.7 kg)    Physical Exam    GEN- The patient is ill appearing, alert and oriented x 3 today.   Head- normocephalic, atraumatic Eyes-  Sclera clear, conjunctiva pink Ears- hearing intact Oropharynx- clear Neck- supple Lungs- decreased L breath sounds, normal work of breathing Heart- Regular rate and rhythm, no murmurs, rubs or gallops GI- soft,  NT, ND, + BS Extremities- no clubbing, cyanosis, or edema Skin- no rash or lesion Psych- euthymic mood, full affect Neuro- strength and sensation are intact  Labs    CBC  Recent Labs  10/18/16 0452 10/19/16 0218  WBC 5.7 5.5  HGB 11.2* 11.9*  HCT 35.2* 37.5  MCV 93.6 93.5  PLT 105* 94*   Basic Metabolic Panel  Recent Labs  10/18/16 0452 10/19/16 0218 10/20/16 0428  NA 137 139 140  K 4.0 4.4 3.8  CL 105 108 109  CO2 26 23 24   GLUCOSE 110* 114* 109*  BUN 16 15 11   CREATININE 0.93 0.90 0.84  CALCIUM 9.0 8.9 8.8*  MG 2.1  --   --      Telemetry    Sinus rhythm with complete heart block (personally reviewed)    Assessment & Plan    1. Complete heart block Persistent post TAVR requiring emergent temporary pacing wire placement Risks, benefits, alternatives to pacemaker implantation were discussed in detail with the patient and her daughter today. They understand that the risks include  but are not limited to bleeding, infection, pneumothorax, perforation, tamponade, vascular damage, renal failure, MI, stroke, death,  and lead dislodgement and wishes to proceed at this time.   Signed, Thompson Grayer, MD  10/20/2016, 8:20 AM

## 2016-10-20 NOTE — Progress Notes (Signed)
Site area:Left femoral  Site Prior to Removal:  Level 0  Pressure Applied For  10 min  Manual: yes  Patient Status During Pull: A/O, WNL  Post Pull Groin Site: level 0  Post Pull Instructions Given:  yes   Dressing Applied: transparent and gauze.  Comments:

## 2016-10-21 ENCOUNTER — Inpatient Hospital Stay (HOSPITAL_COMMUNITY): Payer: Medicare Other

## 2016-10-21 ENCOUNTER — Encounter (HOSPITAL_COMMUNITY): Payer: Self-pay | Admitting: Internal Medicine

## 2016-10-21 DIAGNOSIS — I350 Nonrheumatic aortic (valve) stenosis: Principal | ICD-10-CM

## 2016-10-21 MED ORDER — FUROSEMIDE 20 MG PO TABS
20.0000 mg | ORAL_TABLET | Freq: Every day | ORAL | 0 refills | Status: DC
Start: 2016-10-22 — End: 2016-10-31

## 2016-10-21 MED ORDER — POTASSIUM CHLORIDE CRYS ER 10 MEQ PO TBCR
10.0000 meq | EXTENDED_RELEASE_TABLET | Freq: Every day | ORAL | Status: DC
  Administered 2016-10-21: 10 meq via ORAL
  Filled 2016-10-21: qty 1

## 2016-10-21 MED ORDER — FUROSEMIDE 20 MG PO TABS
20.0000 mg | ORAL_TABLET | Freq: Every day | ORAL | Status: DC
  Administered 2016-10-21: 20 mg via ORAL
  Filled 2016-10-21: qty 1

## 2016-10-21 MED ORDER — POTASSIUM CHLORIDE CRYS ER 10 MEQ PO TBCR: 10.0000 meq | EXTENDED_RELEASE_TABLET | Freq: Every day | ORAL | 0 refills | Status: DC

## 2016-10-21 MED ORDER — ASPIRIN 81 MG PO TBEC: 81.0000 mg | DELAYED_RELEASE_TABLET | Freq: Every day | ORAL | Status: AC

## 2016-10-21 MED ORDER — CLOPIDOGREL BISULFATE 75 MG PO TABS: 75.0000 mg | ORAL_TABLET | Freq: Every day | ORAL | 6 refills | Status: DC

## 2016-10-21 NOTE — Progress Notes (Signed)
Discharged instruction given to pt and daughter, Robin Sutton. Pt and family educated on healthy heart diet, new medication:Plavix, Lasix, and potassium and pacemaker site care. Pt instructed not to lift heavy objects and not to raise arm above heart and not to rolled to left side.They verbalize understanding of teaching

## 2016-10-21 NOTE — Progress Notes (Signed)
CARDIAC REHAB PHASE I   PRE:  Rate/Rhythm: 84 paced  BP:  Sitting: 165/88        SaO2: 93 RA  MODE:  Ambulation: 370 ft   POST:  Rate/Rhythm: 108 paced  BP:  Sitting: 136/95, 122/77 recheck         SaO2: 96 RA  Pt assisted to bathroom prior to ambulation. Pt ambulated 370 ft on RA, hand held assist, mostly steady gait, tolerated well with no complaints. Pt very motivated, daughter at bedside, answered questions. Phase 2 cardiac rehab referral sent to [facility] per pt request. Pt to recliner after walk, call bell within reach.   [phone] Lenna Sciara, RN, BSN 10/21/2016 9:04 AM

## 2016-10-21 NOTE — Care Management Note (Signed)
Case Management Note Original Note Created [name], RN 10/17/2016, 11:15 AM   Patient Details  Name: Robin Sutton MRN: [id number] Date of Birth: 04/23/1935  Subjective/Objective:  From home alone, her daughter, Robin Sutton will be assisting her at discharge for 1 week, her PCP is Dr. Landry Sutton, she has a supplement that takes care of her medications,  Post op TAVR                 Action/Plan: NCM will follow for dc needs.  Expected Discharge Date:  10/21/16               Expected Discharge Plan:  Home/Self Care  In-House Referral:  NA  Discharge planning Services  CM Consult  Post Acute Care Choice:  NA Choice offered to:  NA  DME Arranged:    DME Agency:     HH Arranged:    HH Agency:     Status of Service:  Completed, signed off  If discussed at [place] of Stay Meetings, dates discussed:    Discharge Disposition: home/self care   Additional Comments:  10/21/16- 1200- Robin Mccarley RN, CM- pt for d/c home today- no CM needs noted for discharge.   Robin Client N[place]on, RN 10/21/2016, 12:02 PM [phone]

## 2016-10-21 NOTE — Discharge Instructions (Signed)
Supplemental Discharge Instructions for  Pacemaker/Defibrillator Patients  Activity No heavy lifting or vigorous activity with your left/right arm for 6 to 8 weeks.  Do not raise your left/right arm above your head for one week.  Gradually raise your affected arm as drawn below.           __     10/24/16                  10/25/16                  10/26/16                 10/27/16  NO DRIVING   WOUND CARE - Keep the wound area clean and dry.  Do not get this area wet for one week. No showers for one week; you may shower on 10/27/16    . - The tape/steri-strips on your wound will fall off; do not pull them off.  No bandage is needed on the site.  DO  NOT apply any creams, oils, or ointments to the wound area. - If you notice any drainage or discharge from the wound, any swelling or bruising at the site, or you develop a fever > 101? F after you are discharged home, call the office at once.  Special Instructions - You are still able to use cellular telephones; use the ear opposite the side where you have your pacemaker/defibrillator.  Avoid carrying your cellular phone near your device. - When traveling through airports, show security personnel your identification card to avoid being screened in the metal detectors.  Ask the security personnel to use the hand wand. - Avoid arc welding equipment, MRI testing (magnetic resonance imaging), TENS units (transcutaneous nerve stimulators).  Call the office for questions about other devices. - Avoid electrical appliances that are in poor condition or are not properly grounded. - Microwave ovens are safe to be near or to operate.   TAVR INSTRUCTIONS ACTIVITY AND EXERCISE  Daily activity and exercise are an important part of your recovery. People recover at different rates depending on their general health and type of valve procedure.  Most people recovering from TAVR feel better relatively quickly   No lifting, pushing, pulling more than 10  pounds (examples to avoid: groceries, vacuuming, gardening, golfing):             - For one week with a procedure through the groin.             - For six weeks for procedures through the chest wall.             - For three months for procedures through the breast-bone. NOTE: You will typically see one of our providers 7-10 days after your procedure to discuss [place] the above activities.    DRIVING  Do not drive for until you are seen for follow up and cleared by a provider.  If you have been told by your doctor in the past that you may not drive, you must talk with him/her before you begin driving again.   DRESSING  Groin site: you may leave the clear dressing over the site for up to one week or until it falls off.   HYGIENE  If you had a femoral (leg) procedure, you may take a shower when you return home. After the shower, pat the site dry. Do NOT use powder, oils or lotions in your groin area until the  site has completely healed.  If you had a chest procedure, you may shower when you return home unless specifically instructed not to by your discharging practitioner.             - DO NOT scrub incision; pat dry with a towel             - DO NOT apply any lotions, oils, powders to the incision             - No tub baths / swimming for at least 2 weeks.  If you notice any fevers, chills, increased pain, swelling, bleeding or pus, please contact your doctor.  ADDITIONAL INFORMATION  If you are going to have an upcoming dental procedure, please contact our office as you will require antibiotics ahead of time to prevent infection on your heart valve.     Groin Site Care Refer to this sheet in the next few weeks. These instructions provide you with information on caring for yourself after your procedure. Your caregiver may also give you more specific instructions. Your treatment has been planned according to current medical practices, but problems sometimes occur.  Call your caregiver if you have any problems or questions after your procedure. HOME CARE INSTRUCTIONS  You may shower 24 hours after the procedure. Remove the bandage (dressing) and gently wash the site with plain soap and water. Gently pat the site dry.   Do not apply powder or lotion to the site.   Do not sit in a bathtub, swimming pool, or whirlpool for 5 to 7 days.   No bending, squatting, or lifting anything over 10 pounds (4.5 kg) as directed by your caregiver.   Inspect the site at least twice daily.   Do not drive home if you are discharged the same day of the procedure. Have someone else drive you.   You may drive 24 hours after the procedure unless otherwise instructed by your caregiver.  What to expect:  Any bruising will usually fade within 1 to 2 weeks.   Blood that collects in the tissue (hematoma) may be painful to the touch. It should usually decrease in size and tenderness within 1 to 2 weeks.  SEEK IMMEDIATE MEDICAL CARE IF:  You have unusual pain at the groin site or down the affected leg.   You have redness, warmth, swelling, or pain at the groin site.   You have drainage (other than a small amount of blood on the dressing).   You have chills.   You have a fever or persistent symptoms for more than 72 hours.   You have a fever and your symptoms suddenly get worse.   Your leg becomes pale, cool, tingly, or numb.  You have heavy bleeding from the site. Hold pressure on the site. Marland Kitchen

## 2016-10-22 ENCOUNTER — Encounter: Payer: Self-pay | Admitting: Thoracic Surgery (Cardiothoracic Vascular Surgery)

## 2016-10-22 ENCOUNTER — Telehealth: Payer: Self-pay

## 2016-10-22 MED FILL — Potassium Chloride Inj 2 mEq/ML: INTRAVENOUS | Qty: 40 | Status: AC

## 2016-10-22 MED FILL — Magnesium Sulfate Inj 50%: INTRAMUSCULAR | Qty: 10 | Status: AC

## 2016-10-22 MED FILL — Heparin Sodium (Porcine) Inj 1000 Unit/ML: INTRAMUSCULAR | Qty: 30 | Status: AC

## 2016-10-22 NOTE — Telephone Encounter (Signed)
Patient contacted regarding discharge from [facility] on 10/21/2016. (Spoke with pt's daughter Charleston Poot)  Patient understands to follow up with provider Angelena Form PA-C on 10/31/16 at 2:30 PM at [facility] office and wound check to follow Patient understands discharge instructions? yes Patient understands medications and regiment? yes Patient understands to bring all medications to this visit? yes  At this time the pt is very tired and having issues with her bowels.  The pt is constipated and did use Mirilax last night.  The pt has a history constipation.  I advised that they pt can try OTC Colace and warm prune juice.  Per [name] the pt is not having an issues with her PPM or TAVR surgical sites. [name] will contact the office with any additional questions or concerns.

## 2016-10-22 NOTE — Telephone Encounter (Signed)
TOC Call  Left message on machine for pt to contact the office.   

## 2016-10-27 DIAGNOSIS — N3091 Cystitis, unspecified with hematuria: Secondary | ICD-10-CM | POA: Diagnosis not present

## 2016-10-27 DIAGNOSIS — R300 Dysuria: Secondary | ICD-10-CM | POA: Diagnosis not present

## 2016-10-30 NOTE — Progress Notes (Signed)
Cardiology Office Note    Date:  10/31/2016   ID:  Robin Sutton, DOB [birth year], MRN [id number]  PCP:  Maylon Cos, NP  Cardiologist: Dr. Agustin Cree / Dr. Burt Knack (TAVR) / Dr. Rayann Heman (EP)   CC: TOC visit s/p TAVR   History of Present Illness:  Robin Sutton is a 81 y.o. 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   The patient states that she has known about the presence of a heart murmur for several years. 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 She still has very mild right-sided weakness but overall recovered uneventfully. She has remained entirely functionally independent although she admits that she lives a somewhat sedentary lifestyle. Over the past several months she has developed symptoms of exertional shortness breath and fatigue. She was seen in follow-up by her primary care physician and noted to have a prominent murmur on physical exam. She was referred to Dr. Agustin Cree and underwent transthoracic echocardiogram that revealed severe aortic stenosis with preserved left ventricular systolic function. She subsequently underwent left and right heart catheterization by Dr. Burt Knack confirming the presence of at least moderate aortic stenosis with mild nonobstructive coronary artery disease.  She was referred to the multidisciplinary valve team who ultimately recommended TAVR. She underwent successful TAVR with a 25mm Medtronic Evolut Pro via the R TF approach on 10/17/16. Post operative 2D ECHO showed good TAVR placement with moderately elevated gradients related hyperdynamic LV function and small aortic valve annulus. She developed symptomatic high grade heart block requiring pacemaker placement by Dr. Rayann Heman on 10/20/16. She was discharged on ASA and plavix. She also had some mild volume overload and  was discharged with 5 days of lasix/potassium.  Today she presents to clinic for follow up. No CP or SOB. No LE edema, orthopnea or PND. No dizziness or syncope. No blood in stool or urine. No palpitations. She has felt extremely wiped out and had a lack of appetitie recently.    Past Medical History:  Diagnosis Date  . History of CVA (cerebrovascular accident)   . HLD (hyperlipidemia)   . Hypertension   . S/P placement of cardiac pacemaker    a. developed CHB s/p TAVR and got a [employer] Jude Medical Assurity MRI compatible dual chamber [id number] (serial number  [id number])  . S/P TAVR (transcatheter aortic valve replacement)    a. 10/2016: 23 mm Medtronic Evolut Pro transcatheter heart valve placed via percutaneous right transfemoral approach     Past Surgical History:  Procedure Laterality Date  . BREAST BIOPSY    . PACEMAKER IMPLANT N/A 10/20/2016   Procedure: PACEMAKER IMPLANT;  Surgeon:  Grayer, MD;  Location: [facility];  Service: Cardiovascular;  Laterality: N/A;  . RIGHT/LEFT HEART CATH AND CORONARY ANGIOGRAPHY N/A 09/11/2016   Procedure: RIGHT/LEFT HEART CATH AND CORONARY ANGIOGRAPHY;  Surgeon: Sherren Mocha, MD;  Location: [facility];  Service: Cardiovascular;  Laterality: N/A;  . TEE WITHOUT CARDIOVERSION N/A 10/17/2016   Procedure: TRANSESOPHAGEAL ECHOCARDIOGRAM (TEE);  Surgeon: Sherren Mocha, MD;  Location: [facility];  Service: Open Heart Surgery;  Laterality: N/A;  . TONSILLECTOMY    . TRANSCATHETER AORTIC VALVE REPLACEMENT, TRANSFEMORAL N/A 10/17/2016   Procedure: TRANSCATHETER AORTIC VALVE REPLACEMENT, TRANSFEMORAL;  Surgeon: Sherren Mocha, MD;  Location: [facility];  Service: Open Heart Surgery;  Laterality: N/A;  Current Medications: Outpatient Medications Prior to Visit  Medication Sig Dispense Refill  . aspirin EC 81 MG EC tablet Take 1 tablet (81 mg total) by mouth daily.    . clopidogrel (PLAVIX) 75 MG tablet Take 1 tablet (75 mg total) by mouth daily  with breakfast. 30 tablet 6  . lisinopril (PRINIVIL,ZESTRIL) 10 MG tablet Take 10 mg by mouth every evening.   1  . polyethylene glycol powder (GLYCOLAX/MIRALAX) powder Take 17 g by mouth daily as needed (consitipation).     . pravastatin (PRAVACHOL) 40 MG tablet Take 40 mg by mouth every evening.   1  . Vitamin D, Ergocalciferol, (DRISDOL) 50000 units CAPS capsule Take 50,000 Units by mouth every Monday.   1  . furosemide (LASIX) 20 MG tablet Take 1 tablet (20 mg total) by mouth daily. (Patient not taking: Reported on 10/31/2016) 5 tablet 0  . potassium chloride (K-DUR,KLOR-CON) 10 MEQ tablet Take 1 tablet (10 mEq total) by mouth daily. (Patient not taking: Reported on 10/31/2016) 5 tablet 0   No facility-administered medications prior to visit.      Allergies:   Adhesive [tape]   Social History   Social History  . Marital status: Widowed    Spouse name: N/A  . Number of children: N/A  . Years of education: N/A   Social History Main Topics  . Smoking status: Never Smoker  . Smokeless tobacco: Never Used  . Alcohol use No  . Drug use: No  . Sexual activity: Not Asked   Other Topics Concern  . None   Social History Narrative  . None     Family History:  The patient's family history includes Diabetes in her maternal grandfather; Heart disease in her brother, maternal grandfather, and mother; Hypertension in her brother and mother; Kidney failure in her father; Stroke in her mother.      ROS:   Please see the history of present illness.    ROS All other systems reviewed and are negative.   PHYSICAL EXAM:   VS:  BP 112/62   Pulse 81   Ht 5\' 3"  (1.6 m)   Wt 175 lb (79.4 kg)   SpO2 95%   BMI 31.00 kg/m    GEN: Well nourished, well developed, in no acute distress  HEENT: normal  Neck: no JVD, carotid bruits, or masses Cardiac: RRR; 2/6 SEM @[facility] . No rubs, or gallops,no edema  Respiratory:  clear to auscultation bilaterally, normal work of breathing GI: soft,  nontender, nondistended, + BS MS: no deformity or atrophy  Skin: warm and dry, no rash Neuro:  Alert and Oriented x 3, Strength and sensation are intact Psych: euthymic mood, full affect   Wt Readings from Last 3 Encounters:  10/31/16 175 lb (79.4 kg)  10/21/16 188 lb 0.8 oz (85.3 kg)  10/08/16 181 lb 12.8 oz (82.5 kg)      Studies/Labs Reviewed:   EKG:  EKG is ordered today.  The ekg ordered today demonstrates NSR with sinus arrhythmia, LBBB HR 81  Recent Labs: 10/17/2016: ALT 16 10/18/2016: Magnesium 2.1 10/19/2016: Hemoglobin 11.9; Platelets 94 10/20/2016: BUN 11; Creatinine, Ser 0.84; Potassium 3.8; Sodium 140   Lipid Panel No results found for: CHOL, TRIG, HDL, CHOLHDL, VLDL, LDLCALC, LDLDIRECT  Additional studies/ records that were reviewed today include:  TAVR OPERATIVE NOTE Date of Procedure:10/17/2016   Transcatheter Aortic Valve Replacement - Percutaneous Transfemoral Approach Medtronic Evolut ProTHV (size 17mm, serial # [id number]) Pre-operative Echo Findings: ? Severe aortic stenosis ? Vigorous  left ventricular systolic function  Post-operative Echo Findings: ? Noparavalvular leak ? unchangedleft ventricular systolic function _____________  Post operative 2D ECHO 10/18/16 LV EF: 70% - 75% Study Conclusions - Left ventricle: The cavity size was normal. There was mild concentric hypertrophy. Systolic function was hyperdynamic. The estimated ejection fraction was in the range of 70% to 75%. There was no dynamic obstruction. Wall motion was normal; there were no regional wall motion abnormalities. Features are consistent with a pseudonormal left ventricular filling pattern, with concomitant abnormal relaxation and increased filling pressure (grade 2 diastolic dysfunction). - Aortic valve: A stent-valve (TAVR) bioprosthesis was present. Transvalvular velocity was increased more than expected, due  to high cardiac output. There was mild stenosis. Valve area (VTI): 1.52 cm^2. - Mitral valve: Calcified annulus. - Left atrium: The atrium was mildly dilated. - Atrial septum: No defect or patent foramen ovale was identified. - Pulmonary arteries: Systolic pressure was mildly increased. PA peak pressure: 40 mm Hg (S). - Pericardium, extracardiac: A trivial pericardial effusion was identified. Impressions: - New TAVR prosthesis with moderately increased gradients, in large part due to hyperdynamic LV function and increased stroke volume. Gradients have ncreased substantially since implantation.   ASSESSMENT & PLAN:   Severe AS s/p TAVR: doing well after TAVR. Groin sites are stable. SBE prophylaxis discussed and Rx for amoxicillin sent to pharmacy. She is going to be fit for bottom dentures soon. She will return on 11/7 for 1 month echo and follow up.   CHB s/p PPM: she has wound check today at 3:30pm.   HTN: BP well controlled today  Hx of CVA: continue ASA and statin   HLD: continue statin   Post op volume overload: she was discharged on a short course of lasix/potassium. Will check a BMET today  Fatigue: will check CBC and TSH  Medication Adjustments/Labs and Tests Ordered: Current medicines are reviewed at length with the patient today.  Concerns regarding medicines are outlined above.  Medication changes, Labs and Tests ordered today are listed in the Patient Instructions below. Patient Instructions  Medication Instructions:  Your physician recommends that you continue on your current medications as directed. Please refer to the Current Medication list given to you today.  Labwork: Cbc, Tsh, Bmet today  Testing/Procedures: Your physician has requested that you have an echocardiogram. Echocardiography is a painless test that uses sound waves to create images of your heart. It provides your doctor with information about the size and shape of your heart and how  well your heart's chambers and valves are working. This procedure takes approximately one hour. There are no restrictions for this procedure. (Already scheduled for 11/13/16)  Follow-Up: Follow up as planned with Bonney Leitz, PA on 11/13/16  Any Other Special Instructions Will Be Listed Below (If Applicable).     If you need a refill on your cardiac medications before your next appointment, please call your pharmacy.      Signed, Angelena Form, PA-C  10/31/2016 3:32 PM    [facility] [place], [place],   [address] Phone: [phone]; Fax: [phone]

## 2016-10-31 ENCOUNTER — Ambulatory Visit (INDEPENDENT_AMBULATORY_CARE_PROVIDER_SITE_OTHER): Payer: Medicare Other | Admitting: Physician Assistant

## 2016-10-31 ENCOUNTER — Ambulatory Visit (INDEPENDENT_AMBULATORY_CARE_PROVIDER_SITE_OTHER): Payer: Medicare Other | Admitting: *Deleted

## 2016-10-31 ENCOUNTER — Encounter: Payer: Self-pay | Admitting: Physician Assistant

## 2016-10-31 ENCOUNTER — Encounter (INDEPENDENT_AMBULATORY_CARE_PROVIDER_SITE_OTHER): Payer: Self-pay

## 2016-10-31 ENCOUNTER — Other Ambulatory Visit: Payer: Self-pay | Admitting: Physician Assistant

## 2016-10-31 VITALS — BP 112/62 | HR 81 | Ht 63.0 in | Wt 175.0 lb

## 2016-10-31 DIAGNOSIS — E785 Hyperlipidemia, unspecified: Secondary | ICD-10-CM | POA: Diagnosis not present

## 2016-10-31 DIAGNOSIS — I10 Essential (primary) hypertension: Secondary | ICD-10-CM | POA: Diagnosis not present

## 2016-10-31 DIAGNOSIS — Z952 Presence of prosthetic heart valve: Secondary | ICD-10-CM | POA: Diagnosis not present

## 2016-10-31 DIAGNOSIS — I442 Atrioventricular block, complete: Secondary | ICD-10-CM

## 2016-10-31 DIAGNOSIS — Z8673 Personal history of transient ischemic attack (TIA), and cerebral infarction without residual deficits: Secondary | ICD-10-CM | POA: Diagnosis not present

## 2016-10-31 DIAGNOSIS — Z950 Presence of cardiac pacemaker: Secondary | ICD-10-CM

## 2016-10-31 DIAGNOSIS — E8779 Other fluid overload: Secondary | ICD-10-CM

## 2016-10-31 LAB — CUP PACEART INCLINIC DEVICE CHECK
Battery Remaining Longevity: 123 mo
Battery Voltage: 3.01 V
Brady Statistic RA Percent Paced: 1.4 %
Brady Statistic RV Percent Paced: 73 %
Implantable Lead Implant Date: 20181014
Implantable Lead Location: 753860
Implantable Pulse Generator Implant Date: 20181014
Lead Channel Impedance Value: 475 Ohm
Lead Channel Impedance Value: 725 Ohm
Lead Channel Pacing Threshold Amplitude: 0.75 V
Lead Channel Pacing Threshold Amplitude: 1 V
Lead Channel Pacing Threshold Pulse Width: 0.5 ms
Lead Channel Sensing Intrinsic Amplitude: 3.1 mV
Lead Channel Setting Pacing Amplitude: 1.125
MDC IDC LEAD IMPLANT DT: 20181014
MDC IDC LEAD LOCATION: 753859
MDC IDC MSMT LEADCHNL RA IMPEDANCE VALUE: 475 Ohm
MDC IDC MSMT LEADCHNL RV IMPEDANCE VALUE: 725 Ohm
MDC IDC MSMT LEADCHNL RV PACING THRESHOLD PULSEWIDTH: 0.5 ms
MDC IDC MSMT LEADCHNL RV SENSING INTR AMPL: 12 mV
MDC IDC SESS DTM: 20181025173846
MDC IDC SET LEADCHNL RA PACING AMPLITUDE: 3.5 V
MDC IDC SET LEADCHNL RV PACING PULSEWIDTH: 0.5 ms
MDC IDC SET LEADCHNL RV SENSING SENSITIVITY: 4 mV
Pulse Gen Serial Number: 8951116

## 2016-10-31 MED ORDER — AMOXICILLIN 500 MG PO TABS: 2000.0000 mg | ORAL_TABLET | Freq: Once | ORAL | 12 refills | Status: DC | PRN

## 2016-10-31 NOTE — Patient Instructions (Signed)
Medication Instructions:  Your physician recommends that you continue on your current medications as directed. Please refer to the Current Medication list given to you today.  Labwork: Cbc, Tsh, Bmet today  Testing/Procedures: Your physician has requested that you have an echocardiogram. Echocardiography is a painless test that uses sound waves to create images of your heart. It provides your doctor with information about the size and shape of your heart and how well your heart's chambers and valves are working. This procedure takes approximately one hour. There are no restrictions for this procedure. (Already scheduled for 11/13/16)  Follow-Up: Follow up as planned with Robin Leitz, PA on 11/13/16  Any Other Special Instructions Will Be Listed Below (If Applicable).     If you need a refill on your cardiac medications before your next appointment, please call your pharmacy.

## 2016-10-31 NOTE — Progress Notes (Addendum)
Wound check appointment. Steri-strips removed. Wound without redness, slight greenish ecchmosis visible. Some edema present over pocket, soft and nontender to palpation--patient is unsure if it has improved since implant. Incision edges approximated, wound well healed. Patient denies fever/chills. Patient aware to monitor for signs/symptoms of infection and agrees to call our office if symptoms develop or worsen. Reviewed verbally with JA, who recommended continuing to monitor with wound recheck on 10/13/16 while patient in the office for TAVR f/u.  Normal device function. Thresholds, sensing, and impedances consistent with implant measurements. Device programmed at 3.5V (RA) with auto capture programmed on (RV) for extra safety margin until 3 month visit. Histogram distribution appropriate for patient and level of activity. 13 AMS episodes (<1%)--AT, longest 64min 54sec. No high ventricular rates noted. Patient educated about wound care, arm mobility, lifting restrictions, and Merlin monitor. ROV with [facility] on 11/13/16 and ROV with JA in 3 months.  Patient gave verbal permission to include photo of wound with this note.

## 2016-11-01 ENCOUNTER — Other Ambulatory Visit: Payer: Self-pay | Admitting: Physician Assistant

## 2016-11-01 LAB — CBC
HEMOGLOBIN: 12.9 g/dL (ref 11.1–15.9)
Hematocrit: 38 % (ref 34.0–46.6)
MCH: 30.8 pg (ref 26.6–33.0)
MCHC: 33.9 g/dL (ref 31.5–35.7)
MCV: 91 fL (ref 79–97)
PLATELETS: 191 10*3/uL (ref 150–379)
RBC: 4.19 x10E6/uL (ref 3.77–5.28)
RDW: 13.6 % (ref 12.3–15.4)
WBC: 4.8 10*3/uL (ref 3.4–10.8)

## 2016-11-01 LAB — TSH: TSH: 0.354 u[IU]/mL — AB (ref 0.450–4.500)

## 2016-11-01 LAB — BASIC METABOLIC PANEL
BUN/Creatinine Ratio: 15 (ref 12–28)
BUN: 19 mg/dL (ref 8–27)
CALCIUM: 9.4 mg/dL (ref 8.7–10.3)
CHLORIDE: 100 mmol/L (ref 96–106)
CO2: 20 mmol/L (ref 20–29)
Creatinine, Ser: 1.28 mg/dL — ABNORMAL HIGH (ref 0.57–1.00)
GFR calc Af Amer: 46 mL/min/{1.73_m2} — ABNORMAL LOW (ref >59)
GFR calc non Af Amer: 40 mL/min/{1.73_m2} — ABNORMAL LOW (ref >59)
Glucose: 113 mg/dL — ABNORMAL HIGH (ref 65–99)
POTASSIUM: 4.3 mmol/L (ref 3.5–5.2)
Sodium: 137 mmol/L (ref 134–144)

## 2016-11-05 DIAGNOSIS — I10 Essential (primary) hypertension: Secondary | ICD-10-CM | POA: Diagnosis not present

## 2016-11-05 DIAGNOSIS — Z950 Presence of cardiac pacemaker: Secondary | ICD-10-CM | POA: Diagnosis not present

## 2016-11-05 DIAGNOSIS — Z8673 Personal history of transient ischemic attack (TIA), and cerebral infarction without residual deficits: Secondary | ICD-10-CM | POA: Diagnosis not present

## 2016-11-05 DIAGNOSIS — Z952 Presence of prosthetic heart valve: Secondary | ICD-10-CM | POA: Diagnosis not present

## 2016-11-05 DIAGNOSIS — Z79899 Other long term (current) drug therapy: Secondary | ICD-10-CM | POA: Diagnosis not present

## 2016-11-05 DIAGNOSIS — E785 Hyperlipidemia, unspecified: Secondary | ICD-10-CM | POA: Diagnosis not present

## 2016-11-06 DIAGNOSIS — Z8673 Personal history of transient ischemic attack (TIA), and cerebral infarction without residual deficits: Secondary | ICD-10-CM | POA: Diagnosis not present

## 2016-11-06 DIAGNOSIS — I10 Essential (primary) hypertension: Secondary | ICD-10-CM | POA: Diagnosis not present

## 2016-11-06 DIAGNOSIS — Z952 Presence of prosthetic heart valve: Secondary | ICD-10-CM | POA: Diagnosis not present

## 2016-11-06 DIAGNOSIS — Z950 Presence of cardiac pacemaker: Secondary | ICD-10-CM | POA: Diagnosis not present

## 2016-11-06 DIAGNOSIS — E785 Hyperlipidemia, unspecified: Secondary | ICD-10-CM | POA: Diagnosis not present

## 2016-11-06 DIAGNOSIS — Z79899 Other long term (current) drug therapy: Secondary | ICD-10-CM | POA: Diagnosis not present

## 2016-11-08 DIAGNOSIS — Z952 Presence of prosthetic heart valve: Secondary | ICD-10-CM | POA: Diagnosis not present

## 2016-11-08 DIAGNOSIS — Z79899 Other long term (current) drug therapy: Secondary | ICD-10-CM | POA: Diagnosis not present

## 2016-11-08 DIAGNOSIS — E785 Hyperlipidemia, unspecified: Secondary | ICD-10-CM | POA: Diagnosis not present

## 2016-11-08 DIAGNOSIS — I10 Essential (primary) hypertension: Secondary | ICD-10-CM | POA: Diagnosis not present

## 2016-11-08 DIAGNOSIS — Z8673 Personal history of transient ischemic attack (TIA), and cerebral infarction without residual deficits: Secondary | ICD-10-CM | POA: Diagnosis not present

## 2016-11-08 DIAGNOSIS — Z950 Presence of cardiac pacemaker: Secondary | ICD-10-CM | POA: Diagnosis not present

## 2016-11-11 DIAGNOSIS — Z950 Presence of cardiac pacemaker: Secondary | ICD-10-CM | POA: Diagnosis not present

## 2016-11-11 DIAGNOSIS — I10 Essential (primary) hypertension: Secondary | ICD-10-CM | POA: Diagnosis not present

## 2016-11-11 DIAGNOSIS — E785 Hyperlipidemia, unspecified: Secondary | ICD-10-CM | POA: Diagnosis not present

## 2016-11-11 DIAGNOSIS — Z79899 Other long term (current) drug therapy: Secondary | ICD-10-CM | POA: Diagnosis not present

## 2016-11-11 DIAGNOSIS — Z8673 Personal history of transient ischemic attack (TIA), and cerebral infarction without residual deficits: Secondary | ICD-10-CM | POA: Diagnosis not present

## 2016-11-11 DIAGNOSIS — Z952 Presence of prosthetic heart valve: Secondary | ICD-10-CM | POA: Diagnosis not present

## 2016-11-11 NOTE — Progress Notes (Signed)
Cardiology Office Note    Date:  11/14/2016   ID:  Robin Sutton, DOB 1935-11-07, MRN [id number]  PCP:  Maylon Cos, NP  Cardiologist: Dr. Agustin Cree / Dr. Burt Knack (TAVR) / Dr. Rayann Heman (EP)   CC: 1 month visit s/p TAVR  History of Present Illness:  Robin Sutton is a 81 y.o. female with a history ofHTN, HLD, previous CVA and severe AS s/p TAVR (56/38/75) complicated by CHB s/p PPM (10/20/16) who presents to clinic for 1 month follow up.   The patient states that she has known about the presence of a heart murmur for several years. 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 She still has very mild right-sided weakness but overall recovered uneventfully. She has remained entirely functionally independent although she admits that she lives a somewhat sedentary lifestyle. Over the past several months she has developed symptoms of exertional shortness breath and fatigue. She was seen in follow-up by her primary care physician and noted to have a prominent murmur on physical exam. She was referred to Dr. Agustin Cree and underwent transthoracic echocardiogram that revealed severe aortic stenosis with preserved left ventricular systolic function. She subsequently underwent left and right heart catheterization by Dr. Burt Knack confirming the presence of at least moderate aortic stenosis with mild nonobstructive coronary artery disease.  She was referred to the multidisciplinary valve team who ultimately recommended TAVR. She underwent successful TAVR with a 67mm Medtronic Evolut Pro via the R TF approach on 10/17/16. Post operative 2D ECHO showed good TAVR placement with moderately elevated gradientsrelatedhyperdynamic LV function and small aortic valve annulus. She developed symptomatic high grade heart block requiring pacemaker placement by Dr. Rayann Heman on 10/20/16. She was discharged on ASA and plavix.   I saw her for post hospital follow up on  10/25 and she was feeling okay but "wiped out." Follow up lab work was unremarkable but did reveal a mildly suppressed TSH for which she will follow with primary care.  Today she presents to clinic for follow up. No CP or SOB. No LE edema, orthopnea or PND. No dizziness or syncope. No blood in stool or urine. No palpitations. Overall feeling a lot better. Gets stronger everyday. Has had some itching that has been an annoyance.   Past Medical History:  Diagnosis Date  . History of CVA (cerebrovascular accident)   . HLD (hyperlipidemia)   . Hypertension   . S/P placement of cardiac pacemaker    a. developed CHB s/p TAVR and got a St. Jude Medical Assurity MRI compatible dual chamber [id number] (serial number  [id number])  . S/P TAVR (transcatheter aortic valve replacement)    a. 10/2016: 23 mm Medtronic Evolut Pro transcatheter heart valve placed via percutaneous right transfemoral approach     Past Surgical History:  Procedure Laterality Date  . BREAST BIOPSY    . TONSILLECTOMY      Current Medications: Outpatient Medications Prior to Visit  Medication Sig Dispense Refill  . amoxicillin (AMOXIL) 500 MG tablet Take 4 tablets (2,000 mg total) by mouth once as needed. 4 tablet 12  . aspirin EC 81 MG EC tablet Take 1 tablet (81 mg total) by mouth daily.    . clopidogrel (PLAVIX) 75 MG tablet Take 1 tablet (75 mg total) by mouth daily with breakfast. 30 tablet 6  . Coenzyme Q10 200 MG capsule Take 200 mg by mouth daily.    Marland Kitchen docusate sodium (COLACE) 100 MG capsule Take 100-300 mg by mouth  daily as needed for mild constipation.    Marland Kitchen lisinopril (PRINIVIL,ZESTRIL) 10 MG tablet Take 10 mg by mouth every evening.   1  . omeprazole (PRILOSEC) 40 MG capsule Take 40 mg by mouth daily as needed.    . phenazopyridine (PYRIDIUM) 100 MG tablet Take 1 tablet by mouth every 8 (eight) hours.  0  . polyethylene glycol powder (GLYCOLAX/MIRALAX) powder Take 17 g by mouth daily as needed (consitipation).     .  pravastatin (PRAVACHOL) 40 MG tablet Take 40 mg by mouth every evening.   1  . sulfamethoxazole-trimethoprim (BACTRIM DS,SEPTRA DS) 800-160 MG tablet Take 1 tablet by mouth 2 (two) times daily.  0  . Vitamin D, Ergocalciferol, (DRISDOL) 50000 units CAPS capsule Take 50,000 Units by mouth every Monday.   1   No facility-administered medications prior to visit.      Allergies:   Adhesive [tape]   Social History   Socioeconomic History  . Marital status: Widowed    Spouse name: None  . Number of children: None  . Years of education: None  . Highest education level: None  Social Needs  . Financial resource strain: None  . Food insecurity - worry: None  . Food insecurity - inability: None  . Transportation needs - medical: None  . Transportation needs - non-medical: None  Occupational History  . None  Tobacco Use  . Smoking status: Never Smoker  . Smokeless tobacco: Never Used  Substance and Sexual Activity  . Alcohol use: No  . Drug use: No  . Sexual activity: None  Other Topics Concern  . None  Social History Narrative  . None     Family History:  The patient's family history includes Diabetes in her maternal grandfather; Heart disease in her brother, maternal grandfather, and mother; Hypertension in her brother and mother; Kidney failure in her father; Stroke in her mother.      ROS:   Please see the history of present illness.    ROS All other systems reviewed and are negative.   PHYSICAL EXAM:   VS:  BP 114/70   Pulse 76   Ht 5\' 3"  (1.6 m)   Wt 170 lb (77.1 kg)   SpO2 98%   BMI 30.11 kg/m    GEN: Well nourished, well developed, in no acute distress  HEENT: normal  Neck: no JVD, carotid bruits, or masses Cardiac: RRR; 2/6 SEM. No rubs, or gallops,no edema  Respiratory:  clear to auscultation bilaterally, normal work of breathing GI: soft, nontender, nondistended, + BS MS: no deformity or atrophy  Skin: warm and dry, no rash Neuro:  Alert and Oriented x 3,  Strength and sensation are intact Psych: euthymic mood, full affect   Wt Readings from Last 3 Encounters:  11/13/16 170 lb (77.1 kg)  10/31/16 175 lb (79.4 kg)  10/21/16 188 lb 0.8 oz (85.3 kg)      Studies/Labs Reviewed:   EKG:  EKG is NOT ordered today.    Recent Labs: 10/17/2016: ALT 16 10/18/2016: Magnesium 2.1 10/31/2016: BUN 19; Creatinine, Ser 1.28; Hemoglobin 12.9; Platelets 191; Potassium 4.3; Sodium 137; TSH 0.354   Lipid Panel No results found for: CHOL, TRIG, HDL, CHOLHDL, VLDL, LDLCALC, LDLDIRECT  Additional studies/ records that were reviewed today include:  TAVR OPERATIVE NOTE Date of Procedure:10/17/2016   Transcatheter Aortic Valve Replacement - Percutaneous Transfemoral Approach Medtronic Evolut ProTHV (size 16mm, serial # [id number]) Pre-operative Echo Findings: ? Severe aortic stenosis ? Vigorous left ventricular systolic function  Post-operative Echo Findings: ? Noparavalvular leak ? unchangedleft ventricular systolic function _____________  Post operative 2D ECHO 10/18/16 LV EF: 70% - 75% Study Conclusions - Left ventricle: The cavity size was normal. There was mild concentric hypertrophy. Systolic function was hyperdynamic. The estimated ejection fraction was in the range of 70% to 75%. There was no dynamic obstruction. Wall motion was normal; there were no regional wall motion abnormalities. Features are consistent with a pseudonormal left ventricular filling pattern, with concomitant abnormal relaxation and increased filling pressure (grade 2 diastolic dysfunction). - Aortic valve: A stent-valve (TAVR) bioprosthesis was present. Transvalvular velocity was increased more than expected, due to high cardiac output. There was mild stenosis. Valve area (VTI): 1.52 cm^2. - Mitral valve: Calcified annulus. - Left atrium: The atrium was mildly dilated. - Atrial septum: No defect or patent  foramen ovale was identified. - Pulmonary arteries: Systolic pressure was mildly increased. PA peak pressure: 40 mm Hg (S). - Pericardium, extracardiac: A trivial pericardial effusion was identified. Impressions: - New TAVR prosthesis with moderately increased gradients, in large part due to hyperdynamic LV function and increased stroke volume. Gradients have ncreased substantially since implantation.  _____________  2D ECHO 11/13/16:  Study Conclusions - Left ventricle: Systolic function was normal. The estimated   ejection fraction was in the range of 60% to 65%. The study is   not technically sufficient to allow evaluation of LV diastolic   function. - Aortic valve: Post TAVR with 23 mm Evolut Pro. Gradients have   come down since last echo mean 23 mmHg to 13 mmHg and peak 39   mmHg to 29 mmHg   mild peri valvular regurgitaitons remains. - Mitral valve: Calcified annulus. Moderately thickened, moderately   calcified leaflets . The findings are consistent with mild   stenosis. Valve area by pressure half-time: 1.65 cm^2. - Atrial septum: No defect or patent foramen ovale was identified.   ASSESSMENT & PLAN:   Severe AS s/p TAVR: she has NHYA class II symptoms. 2D ECHO today shows improvements in transaortic gradients and mild PVL; mean grad 13, peak grad 29. She has amoxicillin for SBE prophylaxis. She will return in 1 year for follow up and repeat echo.  CHB s/p PPM: wound recheck today with adjustments made to her device. Followed by Dr. Rayann Heman  HTN: BP well controlled today   Hx of CVA: continue ASA and statin   HLD: continue statin.    Medication Adjustments/Labs and Tests Ordered: Current medicines are reviewed at length with the patient today.  Concerns regarding medicines are outlined above.  Medication changes, Labs and Tests ordered today are listed in the Patient Instructions below. Patient Instructions  Medication Instructions:  Your physician  recommends that you continue on your current medications as directed. Please refer to the Current Medication list given to you today. You can stop Clopidogrel on April 11,2019  Labwork: none  Testing/Procedures: Your physician has requested that you have an echocardiogram. Echocardiography is a painless test that uses sound waves to create images of your heart. It provides your doctor with information about the size and shape of your heart and how well your heart's chambers and valves are working. This procedure takes approximately one hour. There are no restrictions for this procedure.  To be done in 11 months .  We will call you to schedule this appointment.   Follow-Up: Your physician recommends that you schedule a follow-up appointment in: 11 months with K. Grandville Silos, [place].  We will call you to  schedule this appointment.     Any Other Special Instructions Will Be Listed Below (If Applicable).     If you need a refill on your cardiac medications before your next appointment, please call your pharmacy.      Signed, Angelena Form, PA-C  11/14/2016 9:45 AM    [facility] [address], [place], [place]  [address] Phone: [phone]; Fax: [phone]

## 2016-11-13 ENCOUNTER — Other Ambulatory Visit: Payer: Self-pay

## 2016-11-13 ENCOUNTER — Encounter: Payer: Self-pay | Admitting: Physician Assistant

## 2016-11-13 ENCOUNTER — Ambulatory Visit (HOSPITAL_COMMUNITY): Payer: Medicare Other | Attending: Cardiovascular Disease

## 2016-11-13 ENCOUNTER — Ambulatory Visit (INDEPENDENT_AMBULATORY_CARE_PROVIDER_SITE_OTHER): Payer: Medicare Other | Admitting: Physician Assistant

## 2016-11-13 ENCOUNTER — Ambulatory Visit (INDEPENDENT_AMBULATORY_CARE_PROVIDER_SITE_OTHER): Payer: Self-pay | Admitting: *Deleted

## 2016-11-13 VITALS — BP 114/70 | HR 76 | Ht 63.0 in | Wt 170.0 lb

## 2016-11-13 DIAGNOSIS — Z952 Presence of prosthetic heart valve: Secondary | ICD-10-CM | POA: Diagnosis not present

## 2016-11-13 DIAGNOSIS — E785 Hyperlipidemia, unspecified: Secondary | ICD-10-CM | POA: Diagnosis not present

## 2016-11-13 DIAGNOSIS — I442 Atrioventricular block, complete: Secondary | ICD-10-CM | POA: Diagnosis not present

## 2016-11-13 DIAGNOSIS — Z950 Presence of cardiac pacemaker: Secondary | ICD-10-CM | POA: Diagnosis not present

## 2016-11-13 DIAGNOSIS — I10 Essential (primary) hypertension: Secondary | ICD-10-CM

## 2016-11-13 DIAGNOSIS — Z79899 Other long term (current) drug therapy: Secondary | ICD-10-CM | POA: Diagnosis not present

## 2016-11-13 DIAGNOSIS — I050 Rheumatic mitral stenosis: Secondary | ICD-10-CM | POA: Insufficient documentation

## 2016-11-13 DIAGNOSIS — Z8673 Personal history of transient ischemic attack (TIA), and cerebral infarction without residual deficits: Secondary | ICD-10-CM

## 2016-11-13 DIAGNOSIS — I350 Nonrheumatic aortic (valve) stenosis: Secondary | ICD-10-CM | POA: Diagnosis not present

## 2016-11-13 LAB — CUP PACEART INCLINIC DEVICE CHECK
Date Time Interrogation Session: 20181107144925
Implantable Lead Implant Date: 20181014
Implantable Lead Implant Date: 20181014
Implantable Lead Location: 753859
Implantable Lead Location: 753860
Implantable Pulse Generator Implant Date: 20181014
Lead Channel Impedance Value: 737.5 Ohm
Lead Channel Pacing Threshold Pulse Width: 0.5 ms
Lead Channel Sensing Intrinsic Amplitude: 12 mV
Lead Channel Setting Pacing Amplitude: 0.875
Lead Channel Setting Sensing Sensitivity: 4 mV
MDC IDC MSMT BATTERY REMAINING LONGEVITY: 138 mo
MDC IDC MSMT BATTERY VOLTAGE: 3.01 V
MDC IDC MSMT LEADCHNL RA IMPEDANCE VALUE: 475 Ohm
MDC IDC MSMT LEADCHNL RA SENSING INTR AMPL: 4.5 mV
MDC IDC MSMT LEADCHNL RV PACING THRESHOLD AMPLITUDE: 0.625 V
MDC IDC SET LEADCHNL RA PACING AMPLITUDE: 3.5 V
MDC IDC SET LEADCHNL RV PACING PULSEWIDTH: 0.5 ms
MDC IDC STAT BRADY RA PERCENT PACED: 2.2 %
MDC IDC STAT BRADY RV PERCENT PACED: 0.06 %
Pulse Gen Model: 2272
Pulse Gen Serial Number: 8951116

## 2016-11-13 NOTE — Progress Notes (Signed)
Wound re-check, wound well healed, no redness or edema noted. Incision edges approximated. Bruising resolved as well. Conduction intact per JA VIP turned on. ROV 01/23/2016 w/ JA

## 2016-11-13 NOTE — Patient Instructions (Signed)
Medication Instructions:  Your physician recommends that you continue on your current medications as directed. Please refer to the Current Medication list given to you today. You can stop Clopidogrel on April 11,2019  Labwork: none  Testing/Procedures: Your physician has requested that you have an echocardiogram. Echocardiography is a painless test that uses sound waves to create images of your heart. It provides your doctor with information about the size and shape of your heart and how well your heart's chambers and valves are working. This procedure takes approximately one hour. There are no restrictions for this procedure.  To be done in 11 months .  We will call you to schedule this appointment.   Follow-Up: Your physician recommends that you schedule a follow-up appointment in: 11 months with K. Grandville Silos, [place].  We will call you to schedule this appointment.     Any Other Special Instructions Will Be Listed Below (If Applicable).     If you need a refill on your cardiac medications before your next appointment, please call your pharmacy.

## 2016-11-15 DIAGNOSIS — Z952 Presence of prosthetic heart valve: Secondary | ICD-10-CM | POA: Diagnosis not present

## 2016-11-15 DIAGNOSIS — Z950 Presence of cardiac pacemaker: Secondary | ICD-10-CM | POA: Diagnosis not present

## 2016-11-15 DIAGNOSIS — E785 Hyperlipidemia, unspecified: Secondary | ICD-10-CM | POA: Diagnosis not present

## 2016-11-15 DIAGNOSIS — Z8673 Personal history of transient ischemic attack (TIA), and cerebral infarction without residual deficits: Secondary | ICD-10-CM | POA: Diagnosis not present

## 2016-11-15 DIAGNOSIS — I10 Essential (primary) hypertension: Secondary | ICD-10-CM | POA: Diagnosis not present

## 2016-11-15 DIAGNOSIS — Z79899 Other long term (current) drug therapy: Secondary | ICD-10-CM | POA: Diagnosis not present

## 2016-11-18 DIAGNOSIS — Z952 Presence of prosthetic heart valve: Secondary | ICD-10-CM | POA: Diagnosis not present

## 2016-11-18 DIAGNOSIS — Z79899 Other long term (current) drug therapy: Secondary | ICD-10-CM | POA: Diagnosis not present

## 2016-11-18 DIAGNOSIS — Z950 Presence of cardiac pacemaker: Secondary | ICD-10-CM | POA: Diagnosis not present

## 2016-11-18 DIAGNOSIS — E785 Hyperlipidemia, unspecified: Secondary | ICD-10-CM | POA: Diagnosis not present

## 2016-11-18 DIAGNOSIS — I10 Essential (primary) hypertension: Secondary | ICD-10-CM | POA: Diagnosis not present

## 2016-11-18 DIAGNOSIS — Z8673 Personal history of transient ischemic attack (TIA), and cerebral infarction without residual deficits: Secondary | ICD-10-CM | POA: Diagnosis not present

## 2016-11-19 ENCOUNTER — Encounter: Payer: Self-pay | Admitting: Thoracic Surgery (Cardiothoracic Vascular Surgery)

## 2016-11-20 DIAGNOSIS — Z950 Presence of cardiac pacemaker: Secondary | ICD-10-CM | POA: Diagnosis not present

## 2016-11-20 DIAGNOSIS — E785 Hyperlipidemia, unspecified: Secondary | ICD-10-CM | POA: Diagnosis not present

## 2016-11-20 DIAGNOSIS — Z8673 Personal history of transient ischemic attack (TIA), and cerebral infarction without residual deficits: Secondary | ICD-10-CM | POA: Diagnosis not present

## 2016-11-20 DIAGNOSIS — Z952 Presence of prosthetic heart valve: Secondary | ICD-10-CM | POA: Diagnosis not present

## 2016-11-20 DIAGNOSIS — I10 Essential (primary) hypertension: Secondary | ICD-10-CM | POA: Diagnosis not present

## 2016-11-20 DIAGNOSIS — Z79899 Other long term (current) drug therapy: Secondary | ICD-10-CM | POA: Diagnosis not present

## 2016-11-22 DIAGNOSIS — I10 Essential (primary) hypertension: Secondary | ICD-10-CM | POA: Diagnosis not present

## 2016-11-22 DIAGNOSIS — Z8673 Personal history of transient ischemic attack (TIA), and cerebral infarction without residual deficits: Secondary | ICD-10-CM | POA: Diagnosis not present

## 2016-11-22 DIAGNOSIS — E785 Hyperlipidemia, unspecified: Secondary | ICD-10-CM | POA: Diagnosis not present

## 2016-11-22 DIAGNOSIS — Z950 Presence of cardiac pacemaker: Secondary | ICD-10-CM | POA: Diagnosis not present

## 2016-11-22 DIAGNOSIS — Z952 Presence of prosthetic heart valve: Secondary | ICD-10-CM | POA: Diagnosis not present

## 2016-11-22 DIAGNOSIS — Z79899 Other long term (current) drug therapy: Secondary | ICD-10-CM | POA: Diagnosis not present

## 2016-11-25 DIAGNOSIS — Z79899 Other long term (current) drug therapy: Secondary | ICD-10-CM | POA: Diagnosis not present

## 2016-11-25 DIAGNOSIS — I10 Essential (primary) hypertension: Secondary | ICD-10-CM | POA: Diagnosis not present

## 2016-11-25 DIAGNOSIS — Z950 Presence of cardiac pacemaker: Secondary | ICD-10-CM | POA: Diagnosis not present

## 2016-11-25 DIAGNOSIS — Z8673 Personal history of transient ischemic attack (TIA), and cerebral infarction without residual deficits: Secondary | ICD-10-CM | POA: Diagnosis not present

## 2016-11-25 DIAGNOSIS — E785 Hyperlipidemia, unspecified: Secondary | ICD-10-CM | POA: Diagnosis not present

## 2016-11-25 DIAGNOSIS — Z952 Presence of prosthetic heart valve: Secondary | ICD-10-CM | POA: Diagnosis not present

## 2016-11-26 DIAGNOSIS — Z950 Presence of cardiac pacemaker: Secondary | ICD-10-CM | POA: Diagnosis not present

## 2016-11-26 DIAGNOSIS — Z952 Presence of prosthetic heart valve: Secondary | ICD-10-CM | POA: Diagnosis not present

## 2016-11-26 DIAGNOSIS — E785 Hyperlipidemia, unspecified: Secondary | ICD-10-CM | POA: Diagnosis not present

## 2016-11-26 DIAGNOSIS — Z8673 Personal history of transient ischemic attack (TIA), and cerebral infarction without residual deficits: Secondary | ICD-10-CM | POA: Diagnosis not present

## 2016-11-26 DIAGNOSIS — Z79899 Other long term (current) drug therapy: Secondary | ICD-10-CM | POA: Diagnosis not present

## 2016-11-26 DIAGNOSIS — I10 Essential (primary) hypertension: Secondary | ICD-10-CM | POA: Diagnosis not present

## 2016-11-27 DIAGNOSIS — Z952 Presence of prosthetic heart valve: Secondary | ICD-10-CM | POA: Diagnosis not present

## 2016-11-27 DIAGNOSIS — Z950 Presence of cardiac pacemaker: Secondary | ICD-10-CM | POA: Diagnosis not present

## 2016-11-27 DIAGNOSIS — Z79899 Other long term (current) drug therapy: Secondary | ICD-10-CM | POA: Diagnosis not present

## 2016-11-27 DIAGNOSIS — Z8673 Personal history of transient ischemic attack (TIA), and cerebral infarction without residual deficits: Secondary | ICD-10-CM | POA: Diagnosis not present

## 2016-11-27 DIAGNOSIS — E785 Hyperlipidemia, unspecified: Secondary | ICD-10-CM | POA: Diagnosis not present

## 2016-11-27 DIAGNOSIS — I10 Essential (primary) hypertension: Secondary | ICD-10-CM | POA: Diagnosis not present

## 2016-12-02 DIAGNOSIS — Z950 Presence of cardiac pacemaker: Secondary | ICD-10-CM | POA: Diagnosis not present

## 2016-12-02 DIAGNOSIS — E785 Hyperlipidemia, unspecified: Secondary | ICD-10-CM | POA: Diagnosis not present

## 2016-12-02 DIAGNOSIS — I10 Essential (primary) hypertension: Secondary | ICD-10-CM | POA: Diagnosis not present

## 2016-12-02 DIAGNOSIS — Z8673 Personal history of transient ischemic attack (TIA), and cerebral infarction without residual deficits: Secondary | ICD-10-CM | POA: Diagnosis not present

## 2016-12-02 DIAGNOSIS — Z79899 Other long term (current) drug therapy: Secondary | ICD-10-CM | POA: Diagnosis not present

## 2016-12-02 DIAGNOSIS — Z952 Presence of prosthetic heart valve: Secondary | ICD-10-CM | POA: Diagnosis not present

## 2016-12-03 ENCOUNTER — Ambulatory Visit (INDEPENDENT_AMBULATORY_CARE_PROVIDER_SITE_OTHER): Payer: Medicare Other | Admitting: Cardiology

## 2016-12-03 ENCOUNTER — Encounter: Payer: Self-pay | Admitting: Cardiology

## 2016-12-03 VITALS — BP 110/64 | HR 80 | Resp 12 | Ht 63.0 in | Wt 172.8 lb

## 2016-12-03 DIAGNOSIS — Z8673 Personal history of transient ischemic attack (TIA), and cerebral infarction without residual deficits: Secondary | ICD-10-CM | POA: Diagnosis not present

## 2016-12-03 DIAGNOSIS — I10 Essential (primary) hypertension: Secondary | ICD-10-CM

## 2016-12-03 DIAGNOSIS — Z950 Presence of cardiac pacemaker: Secondary | ICD-10-CM | POA: Insufficient documentation

## 2016-12-03 DIAGNOSIS — I442 Atrioventricular block, complete: Secondary | ICD-10-CM

## 2016-12-03 DIAGNOSIS — Z952 Presence of prosthetic heart valve: Secondary | ICD-10-CM | POA: Diagnosis not present

## 2016-12-03 DIAGNOSIS — E785 Hyperlipidemia, unspecified: Secondary | ICD-10-CM

## 2016-12-03 HISTORY — DX: Presence of cardiac pacemaker: Z95.0

## 2016-12-03 NOTE — Progress Notes (Signed)
Cardiology Office Note:    Date:  12/03/2016   ID:  Robin Sutton, DOB 1935/09/20, MRN [id number]  PCP:  Maylon Cos, NP  Cardiologist:  Jenne Campus, MD    Referring MD: Maylon Cos, NP   Chief Complaint  Patient presents with  . 2 month follow up  Doing well  History of Present Illness:    Robin Sutton is a 80 y.o. female with severe aortic stenosis.  A month ago she underwent a TAVR procedure.  It was complicated by complete heart block that required permanent pacing now she is gradually getting better.  She goes to rehab on the regular basis 3 times a week and she is making good progress.  Denies having any chest pain tightness squeezing pressure burning chest no palpitations no dizziness.  Initially she said she felt wear out but now she is slowly getting strength back.  She is happy that she has procedure done.  She is very satisfied and happy with the cares that she received in [facility].  Past Medical History:  Diagnosis Date  . History of CVA (cerebrovascular accident)   . HLD (hyperlipidemia)   . Hypertension   . S/P placement of cardiac pacemaker    a. developed CHB s/p TAVR and got a St. Jude Medical Assurity MRI compatible dual chamber [id number] (serial number  [id number])  . S/P TAVR (transcatheter aortic valve replacement)    a. 10/2016: 23 mm Medtronic Evolut Pro transcatheter heart valve placed via percutaneous right transfemoral approach     Past Surgical History:  Procedure Laterality Date  . BREAST BIOPSY    . PACEMAKER IMPLANT N/A 10/20/2016   Procedure: PACEMAKER IMPLANT;  Surgeon: Thompson Grayer, MD;  Location: [place] [facility];  Service: Cardiovascular;  Laterality: N/A;  . RIGHT/LEFT HEART CATH AND CORONARY ANGIOGRAPHY N/A 09/11/2016   Procedure: RIGHT/LEFT HEART CATH AND CORONARY ANGIOGRAPHY;  Surgeon: Sherren Mocha, MD;  Location: [place] [facility];  Service: Cardiovascular;  Laterality: N/A;  . TEE WITHOUT CARDIOVERSION N/A  10/17/2016   Procedure: TRANSESOPHAGEAL ECHOCARDIOGRAM (TEE);  Surgeon: Sherren Mocha, MD;  Location: [place];  Service: Open Heart Surgery;  Laterality: N/A;  . TONSILLECTOMY    . TRANSCATHETER AORTIC VALVE REPLACEMENT, TRANSFEMORAL N/A 10/17/2016   Procedure: TRANSCATHETER AORTIC VALVE REPLACEMENT, TRANSFEMORAL;  Surgeon: Sherren Mocha, MD;  Location: [place];  Service: Open Heart Surgery;  Laterality: N/A;    Current Medications: Current Meds  Medication Sig  . amoxicillin (AMOXIL) 500 MG tablet Take 4 tablets (2,000 mg total) by mouth once as needed.  Marland Kitchen aspirin EC 81 MG EC tablet Take 1 tablet (81 mg total) by mouth daily.  . clopidogrel (PLAVIX) 75 MG tablet Take 1 tablet (75 mg total) by mouth daily with breakfast.  . Coenzyme Q10 200 MG capsule Take 200 mg by mouth daily.  Marland Kitchen docusate sodium (COLACE) 100 MG capsule Take 100-300 mg by mouth daily as needed for mild constipation.  Marland Kitchen lisinopril (PRINIVIL,ZESTRIL) 10 MG tablet Take 10 mg by mouth every evening.   Marland Kitchen omeprazole (PRILOSEC) 40 MG capsule Take 40 mg by mouth daily as needed.  . polyethylene glycol powder (GLYCOLAX/MIRALAX) powder Take 17 g by mouth daily as needed (consitipation).   . pravastatin (PRAVACHOL) 40 MG tablet Take 40 mg by mouth every evening.   . Vitamin D, Ergocalciferol, (DRISDOL) 50000 units CAPS capsule Take 50,000 Units by mouth every Monday.      Allergies:   Adhesive [tape]   Social History  Socioeconomic History  . Marital status: Widowed    Spouse name: None  . Number of children: None  . Years of education: None  . Highest education level: None  Social Needs  . Financial resource strain: None  . Food insecurity - worry: None  . Food insecurity - inability: None  . Transportation needs - medical: None  . Transportation needs - non-medical: None  Occupational History  . None  Tobacco Use  . Smoking status: Never Smoker  . Smokeless tobacco: Never Used  Substance and Sexual Activity  .  Alcohol use: No  . Drug use: No  . Sexual activity: None  Other Topics Concern  . None  Social History Narrative  . None     Family History: The patient's family history includes Diabetes in her maternal grandfather; Heart disease in her brother, maternal grandfather, and mother; Hypertension in her brother and mother; Kidney failure in her father; Stroke in her mother. ROS:   Please see the history of present illness.    All 14 point review of systems negative except as described per history of present illness  EKGs/Labs/Other Studies Reviewed:      Recent Labs: 10/17/2016: ALT 16 10/18/2016: Magnesium 2.1 10/31/2016: BUN 19; Creatinine, Ser 1.28; Hemoglobin 12.9; Platelets 191; Potassium 4.3; Sodium 137; TSH 0.354  Recent Lipid Panel No results found for: CHOL, TRIG, HDL, CHOLHDL, VLDL, LDLCALC, LDLDIRECT  Physical Exam:    VS:  BP 110/64   Pulse 80   Resp 12   Ht 5\' 3"  (1.6 m)   Wt 172 lb 12.8 oz (78.4 kg)   BMI 30.61 kg/m     Wt Readings from Last 3 Encounters:  12/03/16 172 lb 12.8 oz (78.4 kg)  11/13/16 170 lb (77.1 kg)  10/31/16 175 lb (79.4 kg)     GEN:  Well nourished, well developed in no acute distress HEENT: Normal NECK: No JVD; No carotid bruits LYMPHATICS: No lymphadenopathy CARDIAC: RRR, systolic ejection murmur grade 2/6 best heard at the right upper portion of the sternum: S2 is loud, no rubs, no gallops RESPIRATORY:  Clear to auscultation without rales, wheezing or rhonchi  ABDOMEN: Soft, non-tender, non-distended MUSCULOSKELETAL:  No edema; No deformity  SKIN: Warm and dry LOWER EXTREMITIES: no swelling NEUROLOGIC:  Alert and oriented x 3 PSYCHIATRIC:  Normal affect   ASSESSMENT:    1. Dyslipidemia   2. S/P TAVR (transcatheter aortic valve replacement)   3. History of CVA (cerebrovascular accident)   4. Essential hypertension   5. Pacemaker   6. CHB (complete heart block) (HCC)    PLAN:    In order of problems listed  above:  1. Dyslipidemia: We will continue with pravastatin. 2. Status post TAVR -recovering doing well there is a residual gradient across aortic valve with mean gradient of 29 however she is asymptomatic.  She will required periodic echoes. 3. History of CVA: Stable no new problems. 4. Essential hypertension: Blood pressure well controlled continue present management. 5. Pacemaker at this essential device followed by our EP team from [facility]. 6. Complete heart block: This problem has been addressed by pacemaker.   Medication Adjustments/Labs and Tests Ordered: Current medicines are reviewed at length with the patient today.  Concerns regarding medicines are outlined above.  No orders of the defined types were placed in this encounter.  Medication changes: No orders of the defined types were placed in this encounter.   Signed, Park Liter, MD, [facility] 12/03/2016 2:50 PM  [facility]

## 2016-12-03 NOTE — Patient Instructions (Addendum)
Medication Instructions:  Your physician recommends that you continue on your current medications as directed. Please refer to the Current Medication list given to you today.  Labwork: None   Testing/Procedures: None   Follow-Up: Your physician recommends that you schedule a follow-up appointment in: 2 months  Any Other Special Instructions Will Be Listed Below (If Applicable).  Please note that any paperwork needing to be filled out by the provider will need to be addressed at the front desk prior to seeing the provider. Please note that any paperwork FMLA, Disability or other documents regarding health condition is subject to a $25.00 charge that must be received prior to completion of paperwork in the form of a money order or check.     If you need a refill on your cardiac medications before your next appointment, please call your pharmacy.

## 2016-12-04 DIAGNOSIS — Z8673 Personal history of transient ischemic attack (TIA), and cerebral infarction without residual deficits: Secondary | ICD-10-CM | POA: Diagnosis not present

## 2016-12-04 DIAGNOSIS — I10 Essential (primary) hypertension: Secondary | ICD-10-CM | POA: Diagnosis not present

## 2016-12-04 DIAGNOSIS — Z950 Presence of cardiac pacemaker: Secondary | ICD-10-CM | POA: Diagnosis not present

## 2016-12-04 DIAGNOSIS — E785 Hyperlipidemia, unspecified: Secondary | ICD-10-CM | POA: Diagnosis not present

## 2016-12-04 DIAGNOSIS — Z952 Presence of prosthetic heart valve: Secondary | ICD-10-CM | POA: Diagnosis not present

## 2016-12-04 DIAGNOSIS — Z79899 Other long term (current) drug therapy: Secondary | ICD-10-CM | POA: Diagnosis not present

## 2016-12-06 DIAGNOSIS — Z8673 Personal history of transient ischemic attack (TIA), and cerebral infarction without residual deficits: Secondary | ICD-10-CM | POA: Diagnosis not present

## 2016-12-06 DIAGNOSIS — Z79899 Other long term (current) drug therapy: Secondary | ICD-10-CM | POA: Diagnosis not present

## 2016-12-06 DIAGNOSIS — I10 Essential (primary) hypertension: Secondary | ICD-10-CM | POA: Diagnosis not present

## 2016-12-06 DIAGNOSIS — Z952 Presence of prosthetic heart valve: Secondary | ICD-10-CM | POA: Diagnosis not present

## 2016-12-06 DIAGNOSIS — Z950 Presence of cardiac pacemaker: Secondary | ICD-10-CM | POA: Diagnosis not present

## 2016-12-06 DIAGNOSIS — E785 Hyperlipidemia, unspecified: Secondary | ICD-10-CM | POA: Diagnosis not present

## 2016-12-09 DIAGNOSIS — Z952 Presence of prosthetic heart valve: Secondary | ICD-10-CM | POA: Diagnosis not present

## 2016-12-10 DIAGNOSIS — E669 Obesity, unspecified: Secondary | ICD-10-CM | POA: Diagnosis not present

## 2016-12-10 DIAGNOSIS — I129 Hypertensive chronic kidney disease with stage 1 through stage 4 chronic kidney disease, or unspecified chronic kidney disease: Secondary | ICD-10-CM | POA: Diagnosis not present

## 2016-12-10 DIAGNOSIS — E785 Hyperlipidemia, unspecified: Secondary | ICD-10-CM | POA: Diagnosis not present

## 2016-12-10 DIAGNOSIS — Z952 Presence of prosthetic heart valve: Secondary | ICD-10-CM | POA: Diagnosis not present

## 2016-12-10 DIAGNOSIS — R7303 Prediabetes: Secondary | ICD-10-CM | POA: Diagnosis not present

## 2016-12-10 DIAGNOSIS — Z950 Presence of cardiac pacemaker: Secondary | ICD-10-CM | POA: Diagnosis not present

## 2016-12-10 DIAGNOSIS — E559 Vitamin D deficiency, unspecified: Secondary | ICD-10-CM | POA: Diagnosis not present

## 2016-12-10 DIAGNOSIS — N183 Chronic kidney disease, stage 3 (moderate): Secondary | ICD-10-CM | POA: Diagnosis not present

## 2016-12-11 DIAGNOSIS — Z952 Presence of prosthetic heart valve: Secondary | ICD-10-CM | POA: Diagnosis not present

## 2016-12-13 DIAGNOSIS — Z952 Presence of prosthetic heart valve: Secondary | ICD-10-CM | POA: Diagnosis not present

## 2016-12-18 DIAGNOSIS — Z952 Presence of prosthetic heart valve: Secondary | ICD-10-CM | POA: Diagnosis not present

## 2016-12-20 DIAGNOSIS — Z952 Presence of prosthetic heart valve: Secondary | ICD-10-CM | POA: Diagnosis not present

## 2016-12-23 DIAGNOSIS — Z952 Presence of prosthetic heart valve: Secondary | ICD-10-CM | POA: Diagnosis not present

## 2016-12-25 ENCOUNTER — Encounter: Payer: Self-pay | Admitting: Physician Assistant

## 2016-12-25 DIAGNOSIS — Z952 Presence of prosthetic heart valve: Secondary | ICD-10-CM | POA: Diagnosis not present

## 2016-12-27 DIAGNOSIS — Z952 Presence of prosthetic heart valve: Secondary | ICD-10-CM | POA: Diagnosis not present

## 2017-01-01 DIAGNOSIS — E785 Hyperlipidemia, unspecified: Secondary | ICD-10-CM | POA: Insufficient documentation

## 2017-01-01 DIAGNOSIS — Z950 Presence of cardiac pacemaker: Secondary | ICD-10-CM | POA: Insufficient documentation

## 2017-01-01 DIAGNOSIS — Z952 Presence of prosthetic heart valve: Secondary | ICD-10-CM | POA: Diagnosis not present

## 2017-01-01 DIAGNOSIS — I10 Essential (primary) hypertension: Secondary | ICD-10-CM | POA: Insufficient documentation

## 2017-01-02 DIAGNOSIS — Z952 Presence of prosthetic heart valve: Secondary | ICD-10-CM | POA: Diagnosis not present

## 2017-01-03 DIAGNOSIS — Z952 Presence of prosthetic heart valve: Secondary | ICD-10-CM | POA: Diagnosis not present

## 2017-01-06 DIAGNOSIS — Z952 Presence of prosthetic heart valve: Secondary | ICD-10-CM | POA: Diagnosis not present

## 2017-01-08 DIAGNOSIS — Z955 Presence of coronary angioplasty implant and graft: Secondary | ICD-10-CM | POA: Diagnosis not present

## 2017-01-08 DIAGNOSIS — Z952 Presence of prosthetic heart valve: Secondary | ICD-10-CM | POA: Diagnosis not present

## 2017-01-10 ENCOUNTER — Other Ambulatory Visit: Payer: Self-pay

## 2017-01-10 DIAGNOSIS — Z952 Presence of prosthetic heart valve: Secondary | ICD-10-CM | POA: Diagnosis not present

## 2017-01-10 DIAGNOSIS — Z955 Presence of coronary angioplasty implant and graft: Secondary | ICD-10-CM | POA: Diagnosis not present

## 2017-01-10 MED ORDER — NITROGLYCERIN 0.4 MG SL SUBL: 0.4000 mg | SUBLINGUAL_TABLET | SUBLINGUAL | 3 refills | Status: DC | PRN

## 2017-01-13 DIAGNOSIS — Z952 Presence of prosthetic heart valve: Secondary | ICD-10-CM | POA: Diagnosis not present

## 2017-01-13 DIAGNOSIS — Z955 Presence of coronary angioplasty implant and graft: Secondary | ICD-10-CM | POA: Diagnosis not present

## 2017-01-15 DIAGNOSIS — Z952 Presence of prosthetic heart valve: Secondary | ICD-10-CM | POA: Diagnosis not present

## 2017-01-15 DIAGNOSIS — Z955 Presence of coronary angioplasty implant and graft: Secondary | ICD-10-CM | POA: Diagnosis not present

## 2017-01-17 DIAGNOSIS — Z952 Presence of prosthetic heart valve: Secondary | ICD-10-CM | POA: Diagnosis not present

## 2017-01-17 DIAGNOSIS — Z955 Presence of coronary angioplasty implant and graft: Secondary | ICD-10-CM | POA: Diagnosis not present

## 2017-01-20 DIAGNOSIS — Z955 Presence of coronary angioplasty implant and graft: Secondary | ICD-10-CM | POA: Diagnosis not present

## 2017-01-20 DIAGNOSIS — Z952 Presence of prosthetic heart valve: Secondary | ICD-10-CM | POA: Diagnosis not present

## 2017-01-22 ENCOUNTER — Encounter: Payer: Self-pay | Admitting: Internal Medicine

## 2017-01-22 ENCOUNTER — Ambulatory Visit (INDEPENDENT_AMBULATORY_CARE_PROVIDER_SITE_OTHER): Payer: Medicare Other | Admitting: Internal Medicine

## 2017-01-22 VITALS — BP 124/76 | HR 77 | Ht 63.0 in | Wt 173.0 lb

## 2017-01-22 DIAGNOSIS — I442 Atrioventricular block, complete: Secondary | ICD-10-CM | POA: Diagnosis not present

## 2017-01-22 DIAGNOSIS — Z950 Presence of cardiac pacemaker: Secondary | ICD-10-CM | POA: Diagnosis not present

## 2017-01-22 DIAGNOSIS — Z952 Presence of prosthetic heart valve: Secondary | ICD-10-CM | POA: Diagnosis not present

## 2017-01-22 DIAGNOSIS — Z955 Presence of coronary angioplasty implant and graft: Secondary | ICD-10-CM | POA: Diagnosis not present

## 2017-01-22 NOTE — Patient Instructions (Signed)
Medication Instructions:  Your physician has recommended you make the following change in your medication: 1) Restart Plavix 75 mg daily--stop on 04/17/17   Labwork: None ordered   Testing/Procedures: None ordered   Follow-Up: Your physician wants you to follow-up in: 12 months with Chanetta Marshall, NP You will receive a reminder letter in the mail two months in advance. If you don't receive a letter, please call our office to schedule the follow-up appointment.  Remote monitoring is used to monitor your Pacemaker  from home. This monitoring reduces the number of office visits required to check your device to one time per year. It allows [place] to keep an eye on the functioning of your device to ensure it is working properly. You are scheduled for a device check from home on 04/23/17. You may send your transmission at any time that day. If you have a wireless device, the transmission will be sent automatically. After your physician reviews your transmission, you will receive a postcard with your next transmission date.         Any Other Special Instructions Will Be Listed Below (If Applicable).     If you need a refill on your cardiac medications before your next appointment, please call your pharmacy.

## 2017-01-22 NOTE — Progress Notes (Signed)
PCP: Maylon Cos, NP Primary Cardiologist:  Dr Agustin Cree Primary EP:  Dr Sherryl Manges is a 82 y.o. female who presents today for routine electrophysiology followup.  Since her TAVR/ PPM implant, the patient reports doing very well.  Today, she denies symptoms of palpitations, chest pain, shortness of breath,  lower extremity edema, dizziness, presyncope, or syncope.  The patient is otherwise without complaint today.   Past Medical History:  Diagnosis Date  . History of CVA (cerebrovascular accident)   . HLD (hyperlipidemia)   . Hypertension   . S/P placement of cardiac pacemaker    a. developed CHB s/p TAVR and got a St. Jude Medical Assurity MRI compatible dual chamber [id number] (serial number  [id number])  . S/P TAVR (transcatheter aortic valve replacement)    a. 10/2016: 23 mm Medtronic Evolut Pro transcatheter heart valve placed via percutaneous right transfemoral approach    Past Surgical History:  Procedure Laterality Date  . BREAST BIOPSY    . PACEMAKER IMPLANT N/A 10/20/2016   Procedure: PACEMAKER IMPLANT;  Surgeon: Thompson Grayer, MD;  Location: [facility];  Service: Cardiovascular;  Laterality: N/A;  . RIGHT/LEFT HEART CATH AND CORONARY ANGIOGRAPHY N/A 09/11/2016   Procedure: RIGHT/LEFT HEART CATH AND CORONARY ANGIOGRAPHY;  Surgeon: Sherren Mocha, MD;  Location: [place] [facility];  Service: Cardiovascular;  Laterality: N/A;  . TEE WITHOUT CARDIOVERSION N/A 10/17/2016   Procedure: TRANSESOPHAGEAL ECHOCARDIOGRAM (TEE);  Surgeon: Sherren Mocha, MD;  Location: [place];  Service: Open Heart Surgery;  Laterality: N/A;  . TONSILLECTOMY    . TRANSCATHETER AORTIC VALVE REPLACEMENT, TRANSFEMORAL N/A 10/17/2016   Procedure: TRANSCATHETER AORTIC VALVE REPLACEMENT, TRANSFEMORAL;  Surgeon: Sherren Mocha, MD;  Location: [place];  Service: Open Heart Surgery;  Laterality: N/A;    ROS- all systems are reviewed and negative except as per HPI above  Current Outpatient  Medications  Medication Sig Dispense Refill  . aspirin EC 81 MG EC tablet Take 1 tablet (81 mg total) by mouth daily.    . clopidogrel (PLAVIX) 75 MG tablet Take 1 tablet (75 mg total) by mouth daily with breakfast. 30 tablet 6  . Coenzyme Q10 200 MG capsule Take 200 mg by mouth daily.    Marland Kitchen docusate sodium (COLACE) 100 MG capsule Take 100-300 mg by mouth daily as needed for mild constipation.    Marland Kitchen lisinopril (PRINIVIL,ZESTRIL) 10 MG tablet Take 10 mg by mouth every evening.   1  . nitroGLYCERIN (NITROSTAT) 0.4 MG SL tablet Place 1 tablet (0.4 mg total) under the tongue every 5 (five) minutes as needed for chest pain. 25 tablet 3  . omeprazole (PRILOSEC) 40 MG capsule Take 40 mg by mouth daily as needed.    . polyethylene glycol powder (GLYCOLAX/MIRALAX) powder Take 17 g by mouth daily as needed (consitipation).     . pravastatin (PRAVACHOL) 40 MG tablet Take 40 mg by mouth every evening.   1  . Vitamin D, Ergocalciferol, (DRISDOL) 50000 units CAPS capsule Take 50,000 Units by mouth every Monday.   1  . amoxicillin (AMOXIL) 500 MG tablet Take 4 tablets (2,000 mg total) by mouth once as needed. (Patient not taking: Reported on 01/22/2017) 4 tablet 12   No current facility-administered medications for this visit.     Physical Exam: Vitals:   01/22/17 1451  BP: 124/76  Pulse: 77  Weight: 173 lb (78.5 kg)  Height: 5\' 3"  (1.6 m)    GEN- The patient is well appearing, alert and  oriented x 3 today.   Head- normocephalic, atraumatic Eyes-  Sclera clear, conjunctiva pink Ears- hearing intact Oropharynx- clear Lungs- Clear to ausculation bilaterally, normal work of breathing Chest- pacemaker pocket is well healed Heart- Regular rate and rhythm, no murmurs, rubs or gallops, PMI not laterally displaced GI- soft, NT, ND, + BS Extremities- no clubbing, cyanosis, or edema  Pacemaker interrogation- reviewed in detail today,  See PACEART report   Assessment and Plan:  1. Symptomatic complete  heart block s/p TAVR V conduction has improved.  V paces < 1% Normal pacemaker function See Pace Art report No changes today  2. S/p TAVR Doing well Continue plavix 75mg  daily until 04/17/17 as per Rushie Nyhan note (reviewed today)  Thompson Grayer MD, [facility] 01/22/2017 3:19 PM

## 2017-01-23 LAB — CUP PACEART INCLINIC DEVICE CHECK
Battery Voltage: 3.02 V
Brady Statistic RA Percent Paced: 16 %
Brady Statistic RV Percent Paced: 0.06 %
Date Time Interrogation Session: 20190116210351
Implantable Lead Implant Date: 20181014
Implantable Lead Location: 753860
Lead Channel Impedance Value: 650 Ohm
Lead Channel Pacing Threshold Amplitude: 0.75 V
Lead Channel Pacing Threshold Pulse Width: 0.5 ms
Lead Channel Pacing Threshold Pulse Width: 0.5 ms
Lead Channel Sensing Intrinsic Amplitude: 12 mV
Lead Channel Sensing Intrinsic Amplitude: 3.2 mV
Lead Channel Setting Pacing Amplitude: 2 V
Lead Channel Setting Pacing Pulse Width: 0.5 ms
Lead Channel Setting Sensing Sensitivity: 4 mV
MDC IDC LEAD IMPLANT DT: 20181014
MDC IDC LEAD LOCATION: 753859
MDC IDC MSMT BATTERY REMAINING LONGEVITY: 138 mo
MDC IDC MSMT LEADCHNL RA IMPEDANCE VALUE: 525 Ohm
MDC IDC MSMT LEADCHNL RA PACING THRESHOLD AMPLITUDE: 0.75 V
MDC IDC MSMT LEADCHNL RA PACING THRESHOLD PULSEWIDTH: 0.5 ms
MDC IDC MSMT LEADCHNL RV PACING THRESHOLD AMPLITUDE: 0.5 V
MDC IDC MSMT LEADCHNL RV PACING THRESHOLD AMPLITUDE: 0.5 V
MDC IDC MSMT LEADCHNL RV PACING THRESHOLD PULSEWIDTH: 0.5 ms
MDC IDC PG IMPLANT DT: 20181014
MDC IDC PG SERIAL: 8951116
MDC IDC SET LEADCHNL RV PACING AMPLITUDE: 0.75 V

## 2017-01-24 DIAGNOSIS — Z952 Presence of prosthetic heart valve: Secondary | ICD-10-CM | POA: Diagnosis not present

## 2017-01-24 DIAGNOSIS — Z955 Presence of coronary angioplasty implant and graft: Secondary | ICD-10-CM | POA: Diagnosis not present

## 2017-01-27 DIAGNOSIS — Z955 Presence of coronary angioplasty implant and graft: Secondary | ICD-10-CM | POA: Diagnosis not present

## 2017-01-27 DIAGNOSIS — Z952 Presence of prosthetic heart valve: Secondary | ICD-10-CM | POA: Diagnosis not present

## 2017-02-06 ENCOUNTER — Ambulatory Visit (INDEPENDENT_AMBULATORY_CARE_PROVIDER_SITE_OTHER): Payer: Medicare Other | Admitting: Cardiology

## 2017-02-06 ENCOUNTER — Encounter: Payer: Self-pay | Admitting: Cardiology

## 2017-02-06 VITALS — BP 130/78 | HR 65 | Ht 63.0 in | Wt 173.0 lb

## 2017-02-06 DIAGNOSIS — I10 Essential (primary) hypertension: Secondary | ICD-10-CM

## 2017-02-06 DIAGNOSIS — E785 Hyperlipidemia, unspecified: Secondary | ICD-10-CM | POA: Diagnosis not present

## 2017-02-06 DIAGNOSIS — Z952 Presence of prosthetic heart valve: Secondary | ICD-10-CM | POA: Diagnosis not present

## 2017-02-06 DIAGNOSIS — Z950 Presence of cardiac pacemaker: Secondary | ICD-10-CM | POA: Diagnosis not present

## 2017-02-06 NOTE — Patient Instructions (Signed)
Medication Instructions:  Your physician recommends that you continue on your current medications as directed. Please refer to the Current Medication list given to you today.  Labwork: None ordered  Testing/Procedures: None ordered  Follow-Up: Your physician recommends that you schedule a follow-up appointment in: 3 months follow up with Dr. Krasowski   Any Other Special Instructions Will Be Listed Below (If Applicable).     If you need a refill on your cardiac medications before your next appointment, please call your pharmacy.   

## 2017-02-06 NOTE — Progress Notes (Signed)
Cardiology Office Note:    Date:  02/06/2017   ID:  Robin Sutton, DOB 08-10-1935, MRN [id number]  PCP:  Maylon Cos, NP  Cardiologist:  Jenne Campus, MD    Referring MD: Maylon Cos, NP   Chief Complaint  Patient presents with  . Follow-up  Doing well  History of Present Illness:    Robin Sutton is a 82 y.o. female with a status post transcatheter aortic valve replacement, status post pacemaker.  She is doing very well denies having any issues she graduated from exercise program at [facility] now she goes to gym on the regular basis and she enjoyed herself.  There is no chest pain tightness squeezing pressure burning chest.  There is no dizziness or passing out.  Recently her pacemaker has been interrogated functioning properly normal function.  Past Medical History:  Diagnosis Date  . History of CVA (cerebrovascular accident)   . HLD (hyperlipidemia)   . Hypertension   . S/P placement of cardiac pacemaker    a. developed CHB s/p TAVR and got a St. Jude Medical Assurity MRI compatible dual chamber [id number] (serial number  [id number])  . S/P TAVR (transcatheter aortic valve replacement)    a. 10/2016: 23 mm Medtronic Evolut Pro transcatheter heart valve placed via percutaneous right transfemoral approach     Past Surgical History:  Procedure Laterality Date  . BREAST BIOPSY    . PACEMAKER IMPLANT N/A 10/20/2016   St Jude Medical Assurity MRI conditional  dual-chamber pacemaker for symptomatic complete heart block by Dr Rayann Heman  . RIGHT/LEFT HEART CATH AND CORONARY ANGIOGRAPHY N/A 09/11/2016   Procedure: RIGHT/LEFT HEART CATH AND CORONARY ANGIOGRAPHY;  Surgeon: Sherren Mocha, MD;  Location: [place] [facility];  Service: Cardiovascular;  Laterality: N/A;  . TEE WITHOUT CARDIOVERSION N/A 10/17/2016   Procedure: TRANSESOPHAGEAL ECHOCARDIOGRAM (TEE);  Surgeon: Sherren Mocha, MD;  Location: [place];  Service: Open Heart Surgery;  Laterality: N/A;  .  TONSILLECTOMY    . TRANSCATHETER AORTIC VALVE REPLACEMENT, TRANSFEMORAL N/A 10/17/2016   Procedure: TRANSCATHETER AORTIC VALVE REPLACEMENT, TRANSFEMORAL;  Surgeon: Sherren Mocha, MD;  Location: [place];  Service: Open Heart Surgery;  Laterality: N/A;    Current Medications: Current Meds  Medication Sig  . amoxicillin (AMOXIL) 500 MG capsule Take 500 mg by mouth 3 (three) times daily. Take 4 capsules by mouth once as needed for dental work  . aspirin EC 81 MG EC tablet Take 1 tablet (81 mg total) by mouth daily.  . clopidogrel (PLAVIX) 75 MG tablet Take 1 tablet (75 mg total) by mouth daily with breakfast.  . Coenzyme Q10 200 MG capsule Take 200 mg by mouth daily.  Marland Kitchen docusate sodium (COLACE) 100 MG capsule Take 100-300 mg by mouth daily as needed for mild constipation.  Marland Kitchen lisinopril (PRINIVIL,ZESTRIL) 10 MG tablet Take 10 mg by mouth every evening.   . nitroGLYCERIN (NITROSTAT) 0.4 MG SL tablet Place 1 tablet (0.4 mg total) under the tongue every 5 (five) minutes as needed for chest pain.  Marland Kitchen omeprazole (PRILOSEC) 40 MG capsule Take 40 mg by mouth daily as needed.  . polyethylene glycol powder (GLYCOLAX/MIRALAX) powder Take 17 g by mouth daily as needed (consitipation).   . pravastatin (PRAVACHOL) 40 MG tablet Take 40 mg by mouth every evening.   . Vitamin D, Ergocalciferol, (DRISDOL) 50000 units CAPS capsule Take 50,000 Units by mouth every Monday.      Allergies:   Adhesive [tape]   Social History   Socioeconomic History  .  Marital status: Widowed    Spouse name: None  . Number of children: None  . Years of education: None  . Highest education level: None  Social Needs  . Financial resource strain: None  . Food insecurity - worry: None  . Food insecurity - inability: None  . Transportation needs - medical: None  . Transportation needs - non-medical: None  Occupational History  . None  Tobacco Use  . Smoking status: Never Smoker  . Smokeless tobacco: Never Used  Substance and  Sexual Activity  . Alcohol use: No  . Drug use: No  . Sexual activity: None  Other Topics Concern  . None  Social History Narrative  . None     Family History: The patient's family history includes Diabetes in her maternal grandfather; Heart disease in her brother, maternal grandfather, and mother; Hypertension in her brother and mother; Kidney failure in her father; Stroke in her mother. ROS:   Please see the history of present illness.    All 14 point review of systems negative except as described per history of present illness  EKGs/Labs/Other Studies Reviewed:      Recent Labs: 10/17/2016: ALT 16 10/18/2016: Magnesium 2.1 10/31/2016: BUN 19; Creatinine, Ser 1.28; Hemoglobin 12.9; Platelets 191; Potassium 4.3; Sodium 137; TSH 0.354  Recent Lipid Panel No results found for: CHOL, TRIG, HDL, CHOLHDL, VLDL, LDLCALC, LDLDIRECT  Physical Exam:    VS:  BP 130/78 (BP Location: Left Arm, Patient Position: Sitting, Cuff Size: Normal)   Pulse 65   Ht 5\' 3"  (1.6 m)   Wt 173 lb (78.5 kg)   SpO2 98%   BMI 30.65 kg/m     Wt Readings from Last 3 Encounters:  02/06/17 173 lb (78.5 kg)  01/22/17 173 lb (78.5 kg)  12/03/16 172 lb 12.8 oz (78.4 kg)     GEN:  Well nourished, well developed in no acute distress HEENT: Normal NECK: No JVD; No carotid bruits LYMPHATICS: No lymphadenopathy CARDIAC: RRR, systolic murmur 1/6 best heard at the right upper portion of the sternum without radiation, no rubs, no gallops RESPIRATORY:  Clear to auscultation without rales, wheezing or rhonchi  ABDOMEN: Soft, non-tender, non-distended MUSCULOSKELETAL:  No edema; No deformity  SKIN: Warm and dry LOWER EXTREMITIES: no swelling NEUROLOGIC:  Alert and oriented x 3 PSYCHIATRIC:  Normal affect   ASSESSMENT:    1. S/P TAVR (transcatheter aortic valve replacement)   2. S/P placement of cardiac pacemaker   3. Dyslipidemia   4. Essential hypertension    PLAN:    In order of problems listed  above:  1. Status post transcatheter aortic valve replacement doing well continue present management.  She does have a slightly high gradient across valve however none of the symptomatology would suggest critical stenosis.  Therefore we will simply watch. 2. Status post pacemaker.  Recently device being checked by EP team functioning properly continue present management. 3. Dyslipidemia, stable on statin which I will continue. 4. Essential hypertension: Blood pressure well controlled continue present management.  Was some confusion about Plavix that being clarified.  She understands the need to take Plavix until 11 April.  Aspirin indefinitely.   Medication Adjustments/Labs and Tests Ordered: Current medicines are reviewed at length with the patient today.  Concerns regarding medicines are outlined above.  No orders of the defined types were placed in this encounter.  Medication changes: No orders of the defined types were placed in this encounter.   Signed, Park Liter, MD, [facility] 02/06/2017  10:30 AM    [facility]

## 2017-04-23 ENCOUNTER — Telehealth: Payer: Self-pay | Admitting: Cardiology

## 2017-04-23 ENCOUNTER — Ambulatory Visit (INDEPENDENT_AMBULATORY_CARE_PROVIDER_SITE_OTHER): Payer: Medicare Other | Admitting: *Deleted

## 2017-04-23 DIAGNOSIS — I442 Atrioventricular block, complete: Secondary | ICD-10-CM | POA: Diagnosis not present

## 2017-04-23 NOTE — Telephone Encounter (Signed)
Spoke with pt and reminded pt of remote transmission that is due today. Pt verbalized understanding.   

## 2017-04-24 ENCOUNTER — Encounter: Payer: Self-pay | Admitting: Cardiology

## 2017-04-24 NOTE — Progress Notes (Signed)
Letter  

## 2017-04-24 NOTE — Progress Notes (Signed)
Remote pacemaker transmission.   

## 2017-04-29 LAB — CUP PACEART REMOTE DEVICE CHECK
Battery Voltage: 2.99 V
Brady Statistic AP VP Percent: 1.9 %
Brady Statistic AP VS Percent: 16 %
Brady Statistic AS VP Percent: 18 %
Brady Statistic AS VS Percent: 64 %
Implantable Lead Implant Date: 20181014
Implantable Lead Location: 753859
Implantable Pulse Generator Implant Date: 20181014
Lead Channel Impedance Value: 460 Ohm
Lead Channel Impedance Value: 650 Ohm
Lead Channel Pacing Threshold Amplitude: 0.625 V
Lead Channel Pacing Threshold Amplitude: 0.75 V
Lead Channel Pacing Threshold Pulse Width: 0.5 ms
Lead Channel Setting Pacing Amplitude: 5 V
Lead Channel Setting Pacing Pulse Width: 0.5 ms
MDC IDC LEAD IMPLANT DT: 20181014
MDC IDC LEAD LOCATION: 753860
MDC IDC MSMT BATTERY REMAINING LONGEVITY: 101 mo
MDC IDC MSMT BATTERY REMAINING PERCENTAGE: 95.5 %
MDC IDC MSMT LEADCHNL RA PACING THRESHOLD PULSEWIDTH: 0.5 ms
MDC IDC MSMT LEADCHNL RA SENSING INTR AMPL: 2.6 mV
MDC IDC MSMT LEADCHNL RV SENSING INTR AMPL: 12 mV
MDC IDC SESS DTM: 20190417163411
MDC IDC SET LEADCHNL RA PACING AMPLITUDE: 2 V
MDC IDC SET LEADCHNL RV SENSING SENSITIVITY: 4 mV
MDC IDC STAT BRADY RA PERCENT PACED: 16 %
MDC IDC STAT BRADY RV PERCENT PACED: 20 %
Pulse Gen Serial Number: 8951116

## 2017-05-08 ENCOUNTER — Ambulatory Visit (INDEPENDENT_AMBULATORY_CARE_PROVIDER_SITE_OTHER): Payer: Medicare Other | Admitting: Cardiology

## 2017-05-08 ENCOUNTER — Encounter: Payer: Self-pay | Admitting: Cardiology

## 2017-05-08 VITALS — BP 130/60 | HR 86 | Ht 63.0 in | Wt 171.4 lb

## 2017-05-08 DIAGNOSIS — Z952 Presence of prosthetic heart valve: Secondary | ICD-10-CM

## 2017-05-08 DIAGNOSIS — Z8673 Personal history of transient ischemic attack (TIA), and cerebral infarction without residual deficits: Secondary | ICD-10-CM | POA: Diagnosis not present

## 2017-05-08 DIAGNOSIS — Z950 Presence of cardiac pacemaker: Secondary | ICD-10-CM | POA: Diagnosis not present

## 2017-05-08 DIAGNOSIS — E785 Hyperlipidemia, unspecified: Secondary | ICD-10-CM | POA: Diagnosis not present

## 2017-05-08 DIAGNOSIS — I10 Essential (primary) hypertension: Secondary | ICD-10-CM

## 2017-05-08 NOTE — Patient Instructions (Signed)
Medication Instructions:  Your physician recommends that you continue on your current medications as directed. Please refer to the Current Medication list given to you today.  Labwork: None  Testing/Procedures: None  Follow-Up: Your physician wants you to follow-up in: 5 months. You will receive a reminder letter in the mail two months in advance. If you don't receive a letter, please call our office to schedule the follow-up appointment.  Any Other Special Instructions Will Be Listed Below (If Applicable).     If you need a refill on your cardiac medications before your next appointment, please call your pharmacy.   

## 2017-05-08 NOTE — Progress Notes (Signed)
Cardiology Office Note:    Date:  05/08/2017   ID:  Robin Sutton, DOB January 18, 1935, MRN [id number]  PCP:  Maylon Cos, NP  Cardiologist:  Jenne Campus, MD    Referring MD: Maylon Cos, NP   Chief Complaint  Patient presents with  . Follow-up  Doing much better  History of Present Illness:    Robin Sutton is a 82 y.o. female with a history of transcatheter aortic valve replacement also history of CVA pacemaker hypertension.  Overall she is doing great few days ago she went to clean up her mailbox eventually she decided to pain that when she was going to the basement she ended up falling down after she lost her balance she was unable to get up make a long story short she stay for couple hours laying on the ground until somebody eventually came to her home and try to help her up.  First we gave her advice is now if she goes out of the house to take a cell phone with her however family also is trying to give her some device that we will be able to raise alarm if situationally this happen again.  Past Medical History:  Diagnosis Date  . History of CVA (cerebrovascular accident)   . HLD (hyperlipidemia)   . Hypertension   . S/P placement of cardiac pacemaker    a. developed CHB s/p TAVR and got a St. Jude Medical Assurity MRI compatible dual chamber [id number] (serial number  [id number])  . S/P TAVR (transcatheter aortic valve replacement)    a. 10/2016: 23 mm Medtronic Evolut Pro transcatheter heart valve placed via percutaneous right transfemoral approach     Past Surgical History:  Procedure Laterality Date  . BREAST BIOPSY    . PACEMAKER IMPLANT N/A 10/20/2016   St Jude Medical Assurity MRI conditional  dual-chamber pacemaker for symptomatic complete heart block by Dr Rayann Heman  . RIGHT/LEFT HEART CATH AND CORONARY ANGIOGRAPHY N/A 09/11/2016   Procedure: RIGHT/LEFT HEART CATH AND CORONARY ANGIOGRAPHY;  Surgeon: Sherren Mocha, MD;  Location: [place] [facility];  Service:  Cardiovascular;  Laterality: N/A;  . TEE WITHOUT CARDIOVERSION N/A 10/17/2016   Procedure: TRANSESOPHAGEAL ECHOCARDIOGRAM (TEE);  Surgeon: Sherren Mocha, MD;  Location: [place];  Service: Open Heart Surgery;  Laterality: N/A;  . TONSILLECTOMY    . TRANSCATHETER AORTIC VALVE REPLACEMENT, TRANSFEMORAL N/A 10/17/2016   Procedure: TRANSCATHETER AORTIC VALVE REPLACEMENT, TRANSFEMORAL;  Surgeon: Sherren Mocha, MD;  Location: [place];  Service: Open Heart Surgery;  Laterality: N/A;    Current Medications: Current Meds  Medication Sig  . amoxicillin (AMOXIL) 500 MG capsule Take 500 mg by mouth 3 (three) times daily. Take 4 capsules by mouth once as needed for dental work  . aspirin EC 81 MG EC tablet Take 1 tablet (81 mg total) by mouth daily.  . Coenzyme Q10 200 MG capsule Take 200 mg by mouth daily.  Marland Kitchen docusate sodium (COLACE) 100 MG capsule Take 100-300 mg by mouth daily as needed for mild constipation.  Marland Kitchen lisinopril (PRINIVIL,ZESTRIL) 10 MG tablet Take 10 mg by mouth every evening.   . nitroGLYCERIN (NITROSTAT) 0.4 MG SL tablet Place 1 tablet (0.4 mg total) under the tongue every 5 (five) minutes as needed for chest pain.  Marland Kitchen omeprazole (PRILOSEC) 40 MG capsule Take 40 mg by mouth daily as needed.  . polyethylene glycol powder (GLYCOLAX/MIRALAX) powder Take 17 g by mouth daily as needed (consitipation).   . pravastatin (PRAVACHOL) 40 MG tablet Take 40  mg by mouth every evening.   . Vitamin D, Ergocalciferol, (DRISDOL) 50000 units CAPS capsule Take 50,000 Units by mouth every Monday.      Allergies:   Adhesive [tape]   Social History   Socioeconomic History  . Marital status: Widowed    Spouse name: Not on file  . Number of children: Not on file  . Years of education: Not on file  . Highest education level: Not on file  Occupational History  . Not on file  Social Needs  . Financial resource strain: Not on file  . Food insecurity:    Worry: Not on file    Inability: Not on file  .  Transportation needs:    Medical: Not on file    Non-medical: Not on file  Tobacco Use  . Smoking status: Never Smoker  . Smokeless tobacco: Never Used  Substance and Sexual Activity  . Alcohol use: No  . Drug use: No  . Sexual activity: Not on file  Lifestyle  . Physical activity:    Days per week: Not on file    Minutes per session: Not on file  . Stress: Not on file  Relationships  . Social connections:    Talks on phone: Not on file    Gets together: Not on file    Attends religious service: Not on file    Active member of club or organization: Not on file    Attends meetings of clubs or organizations: Not on file    Relationship status: Not on file  Other Topics Concern  . Not on file  Social History Narrative  . Not on file     Family History: The patient's family history includes Diabetes in her maternal grandfather; Heart disease in her brother, maternal grandfather, and mother; Hypertension in her brother and mother; Kidney failure in her father; Stroke in her mother. ROS:   Please see the history of present illness.    All 14 point review of systems negative except as described per history of present illness  EKGs/Labs/Other Studies Reviewed:      Recent Labs: 10/17/2016: ALT 16 10/18/2016: Magnesium 2.1 10/31/2016: BUN 19; Creatinine, Ser 1.28; Hemoglobin 12.9; Platelets 191; Potassium 4.3; Sodium 137; TSH 0.354  Recent Lipid Panel No results found for: CHOL, TRIG, HDL, CHOLHDL, VLDL, LDLCALC, LDLDIRECT  Physical Exam:    VS:  BP 130/60   Pulse 86   Ht 5\' 3"  (1.6 m)   Wt 171 lb 6.4 oz (77.7 kg)   SpO2 96%   BMI 30.36 kg/m     Wt Readings from Last 3 Encounters:  05/08/17 171 lb 6.4 oz (77.7 kg)  02/06/17 173 lb (78.5 kg)  01/22/17 173 lb (78.5 kg)     GEN:  Well nourished, well developed in no acute distress HEENT: Normal NECK: No JVD; No carotid bruits LYMPHATICS: No lymphadenopathy CARDIAC: RRR, systolic ejection murmur grade 1/6 best  heard at the right upper portion of the sternum, no rubs, no gallops RESPIRATORY:  Clear to auscultation without rales, wheezing or rhonchi  ABDOMEN: Soft, non-tender, non-distended MUSCULOSKELETAL:  No edema; No deformity  SKIN: Warm and dry LOWER EXTREMITIES: no swelling NEUROLOGIC:  Alert and oriented x 3 PSYCHIATRIC:  Normal affect   ASSESSMENT:    1. S/P TAVR (transcatheter aortic valve replacement)   2. S/P placement of cardiac pacemaker   3. Dyslipidemia   4. History of CVA (cerebrovascular accident)   5. Essential hypertension    PLAN:  In order of problems listed above:  1. Past TAVR.  Overall she is doing well.  Much more energy she tells me now retrospectively she felt very poorly because of her aortic problem for months and years before it happened now she feels so much better that she is so energetic she is trying to do probably too much.  She was instructed to slow down a little bit but still I want her to be active and do things.  We were talking about 50 measures that she can take to avoid situation like she had a few weeks ago. 2. Pacemaker present recent interrogation normal function continue monitoring 3. Dyslipidemia she takes pravastatin which I will continue. 4. Essential hypertension 130/62 doing well continue present management.  See her back in the office in about 5 months or sooner if she has a problem   Medication Adjustments/Labs and Tests Ordered: Current medicines are reviewed at length with the patient today.  Concerns regarding medicines are outlined above.  No orders of the defined types were placed in this encounter.  Medication changes: No orders of the defined types were placed in this encounter.   Signed, Park Liter, MD, [facility] 05/08/2017 11:06 AM    [place]

## 2017-06-10 DIAGNOSIS — E785 Hyperlipidemia, unspecified: Secondary | ICD-10-CM | POA: Diagnosis not present

## 2017-06-10 DIAGNOSIS — E559 Vitamin D deficiency, unspecified: Secondary | ICD-10-CM | POA: Diagnosis not present

## 2017-06-10 DIAGNOSIS — I129 Hypertensive chronic kidney disease with stage 1 through stage 4 chronic kidney disease, or unspecified chronic kidney disease: Secondary | ICD-10-CM | POA: Diagnosis not present

## 2017-06-10 DIAGNOSIS — R7303 Prediabetes: Secondary | ICD-10-CM | POA: Diagnosis not present

## 2017-06-19 DIAGNOSIS — E875 Hyperkalemia: Secondary | ICD-10-CM | POA: Diagnosis not present

## 2017-06-24 DIAGNOSIS — E875 Hyperkalemia: Secondary | ICD-10-CM | POA: Diagnosis not present

## 2017-07-14 DIAGNOSIS — Z6830 Body mass index (BMI) 30.0-30.9, adult: Secondary | ICD-10-CM | POA: Diagnosis not present

## 2017-07-14 DIAGNOSIS — N959 Unspecified menopausal and perimenopausal disorder: Secondary | ICD-10-CM | POA: Diagnosis not present

## 2017-07-14 DIAGNOSIS — Z9181 History of falling: Secondary | ICD-10-CM | POA: Diagnosis not present

## 2017-07-14 DIAGNOSIS — E785 Hyperlipidemia, unspecified: Secondary | ICD-10-CM | POA: Diagnosis not present

## 2017-07-14 DIAGNOSIS — E669 Obesity, unspecified: Secondary | ICD-10-CM | POA: Diagnosis not present

## 2017-07-14 DIAGNOSIS — Z0000 Encounter for general adult medical examination without abnormal findings: Secondary | ICD-10-CM | POA: Diagnosis not present

## 2017-07-14 DIAGNOSIS — Z1231 Encounter for screening mammogram for malignant neoplasm of breast: Secondary | ICD-10-CM | POA: Diagnosis not present

## 2017-07-14 DIAGNOSIS — Z1331 Encounter for screening for depression: Secondary | ICD-10-CM | POA: Diagnosis not present

## 2017-07-14 DIAGNOSIS — Z136 Encounter for screening for cardiovascular disorders: Secondary | ICD-10-CM | POA: Diagnosis not present

## 2017-07-22 DIAGNOSIS — M9902 Segmental and somatic dysfunction of thoracic region: Secondary | ICD-10-CM | POA: Diagnosis not present

## 2017-07-22 DIAGNOSIS — M5441 Lumbago with sciatica, right side: Secondary | ICD-10-CM | POA: Diagnosis not present

## 2017-07-22 DIAGNOSIS — M9904 Segmental and somatic dysfunction of sacral region: Secondary | ICD-10-CM | POA: Diagnosis not present

## 2017-07-22 DIAGNOSIS — M5137 Other intervertebral disc degeneration, lumbosacral region: Secondary | ICD-10-CM | POA: Diagnosis not present

## 2017-07-22 DIAGNOSIS — M5136 Other intervertebral disc degeneration, lumbar region: Secondary | ICD-10-CM | POA: Diagnosis not present

## 2017-07-22 DIAGNOSIS — M546 Pain in thoracic spine: Secondary | ICD-10-CM | POA: Diagnosis not present

## 2017-07-22 DIAGNOSIS — Z1231 Encounter for screening mammogram for malignant neoplasm of breast: Secondary | ICD-10-CM | POA: Diagnosis not present

## 2017-07-22 DIAGNOSIS — M9903 Segmental and somatic dysfunction of lumbar region: Secondary | ICD-10-CM | POA: Diagnosis not present

## 2017-07-23 ENCOUNTER — Ambulatory Visit (INDEPENDENT_AMBULATORY_CARE_PROVIDER_SITE_OTHER): Payer: Medicare Other | Admitting: *Deleted

## 2017-07-23 DIAGNOSIS — M5137 Other intervertebral disc degeneration, lumbosacral region: Secondary | ICD-10-CM | POA: Diagnosis not present

## 2017-07-23 DIAGNOSIS — M9902 Segmental and somatic dysfunction of thoracic region: Secondary | ICD-10-CM | POA: Diagnosis not present

## 2017-07-23 DIAGNOSIS — I442 Atrioventricular block, complete: Secondary | ICD-10-CM | POA: Diagnosis not present

## 2017-07-23 DIAGNOSIS — M9903 Segmental and somatic dysfunction of lumbar region: Secondary | ICD-10-CM | POA: Diagnosis not present

## 2017-07-23 DIAGNOSIS — M5136 Other intervertebral disc degeneration, lumbar region: Secondary | ICD-10-CM | POA: Diagnosis not present

## 2017-07-23 DIAGNOSIS — M5441 Lumbago with sciatica, right side: Secondary | ICD-10-CM | POA: Diagnosis not present

## 2017-07-23 DIAGNOSIS — M9904 Segmental and somatic dysfunction of sacral region: Secondary | ICD-10-CM | POA: Diagnosis not present

## 2017-07-23 DIAGNOSIS — M546 Pain in thoracic spine: Secondary | ICD-10-CM | POA: Diagnosis not present

## 2017-07-24 ENCOUNTER — Encounter: Payer: Self-pay | Admitting: Cardiology

## 2017-07-24 NOTE — Progress Notes (Signed)
Remote pacemaker transmission.   

## 2017-07-25 DIAGNOSIS — M9903 Segmental and somatic dysfunction of lumbar region: Secondary | ICD-10-CM | POA: Diagnosis not present

## 2017-07-25 DIAGNOSIS — M9902 Segmental and somatic dysfunction of thoracic region: Secondary | ICD-10-CM | POA: Diagnosis not present

## 2017-07-25 DIAGNOSIS — M9904 Segmental and somatic dysfunction of sacral region: Secondary | ICD-10-CM | POA: Diagnosis not present

## 2017-07-25 DIAGNOSIS — M5137 Other intervertebral disc degeneration, lumbosacral region: Secondary | ICD-10-CM | POA: Diagnosis not present

## 2017-07-25 DIAGNOSIS — M5441 Lumbago with sciatica, right side: Secondary | ICD-10-CM | POA: Diagnosis not present

## 2017-07-25 DIAGNOSIS — M5136 Other intervertebral disc degeneration, lumbar region: Secondary | ICD-10-CM | POA: Diagnosis not present

## 2017-07-25 DIAGNOSIS — M546 Pain in thoracic spine: Secondary | ICD-10-CM | POA: Diagnosis not present

## 2017-07-28 DIAGNOSIS — M9904 Segmental and somatic dysfunction of sacral region: Secondary | ICD-10-CM | POA: Diagnosis not present

## 2017-07-28 DIAGNOSIS — M5441 Lumbago with sciatica, right side: Secondary | ICD-10-CM | POA: Diagnosis not present

## 2017-07-28 DIAGNOSIS — M546 Pain in thoracic spine: Secondary | ICD-10-CM | POA: Diagnosis not present

## 2017-07-28 DIAGNOSIS — M5136 Other intervertebral disc degeneration, lumbar region: Secondary | ICD-10-CM | POA: Diagnosis not present

## 2017-07-28 DIAGNOSIS — M9903 Segmental and somatic dysfunction of lumbar region: Secondary | ICD-10-CM | POA: Diagnosis not present

## 2017-07-28 DIAGNOSIS — M9902 Segmental and somatic dysfunction of thoracic region: Secondary | ICD-10-CM | POA: Diagnosis not present

## 2017-07-28 DIAGNOSIS — M5137 Other intervertebral disc degeneration, lumbosacral region: Secondary | ICD-10-CM | POA: Diagnosis not present

## 2017-07-30 DIAGNOSIS — M5441 Lumbago with sciatica, right side: Secondary | ICD-10-CM | POA: Diagnosis not present

## 2017-07-30 DIAGNOSIS — M9902 Segmental and somatic dysfunction of thoracic region: Secondary | ICD-10-CM | POA: Diagnosis not present

## 2017-07-30 DIAGNOSIS — M546 Pain in thoracic spine: Secondary | ICD-10-CM | POA: Diagnosis not present

## 2017-07-30 DIAGNOSIS — M9904 Segmental and somatic dysfunction of sacral region: Secondary | ICD-10-CM | POA: Diagnosis not present

## 2017-07-30 DIAGNOSIS — M5136 Other intervertebral disc degeneration, lumbar region: Secondary | ICD-10-CM | POA: Diagnosis not present

## 2017-07-30 DIAGNOSIS — M5137 Other intervertebral disc degeneration, lumbosacral region: Secondary | ICD-10-CM | POA: Diagnosis not present

## 2017-07-30 DIAGNOSIS — M9903 Segmental and somatic dysfunction of lumbar region: Secondary | ICD-10-CM | POA: Diagnosis not present

## 2017-08-01 DIAGNOSIS — M9903 Segmental and somatic dysfunction of lumbar region: Secondary | ICD-10-CM | POA: Diagnosis not present

## 2017-08-01 DIAGNOSIS — M5137 Other intervertebral disc degeneration, lumbosacral region: Secondary | ICD-10-CM | POA: Diagnosis not present

## 2017-08-01 DIAGNOSIS — M9902 Segmental and somatic dysfunction of thoracic region: Secondary | ICD-10-CM | POA: Diagnosis not present

## 2017-08-01 DIAGNOSIS — M5136 Other intervertebral disc degeneration, lumbar region: Secondary | ICD-10-CM | POA: Diagnosis not present

## 2017-08-01 DIAGNOSIS — M9904 Segmental and somatic dysfunction of sacral region: Secondary | ICD-10-CM | POA: Diagnosis not present

## 2017-08-01 DIAGNOSIS — M546 Pain in thoracic spine: Secondary | ICD-10-CM | POA: Diagnosis not present

## 2017-08-01 DIAGNOSIS — M5441 Lumbago with sciatica, right side: Secondary | ICD-10-CM | POA: Diagnosis not present

## 2017-08-03 LAB — CUP PACEART REMOTE DEVICE CHECK
Battery Remaining Longevity: 113 mo
Battery Remaining Percentage: 95.5 %
Battery Voltage: 2.99 V
Brady Statistic AP VP Percent: 8.2 %
Brady Statistic RA Percent Paced: 17 %
Brady Statistic RV Percent Paced: 49 %
Date Time Interrogation Session: 20190717201225
Implantable Lead Implant Date: 20181014
Implantable Lead Implant Date: 20181014
Implantable Lead Location: 753859
Implantable Pulse Generator Implant Date: 20181014
Lead Channel Impedance Value: 690 Ohm
Lead Channel Pacing Threshold Amplitude: 0.5 V
Lead Channel Pacing Threshold Pulse Width: 0.5 ms
Lead Channel Sensing Intrinsic Amplitude: 2.4 mV
Lead Channel Setting Pacing Amplitude: 0.75 V
Lead Channel Setting Pacing Pulse Width: 0.5 ms
Lead Channel Setting Sensing Sensitivity: 4 mV
MDC IDC LEAD LOCATION: 753860
MDC IDC MSMT LEADCHNL RA IMPEDANCE VALUE: 450 Ohm
MDC IDC MSMT LEADCHNL RA PACING THRESHOLD AMPLITUDE: 0.75 V
MDC IDC MSMT LEADCHNL RA PACING THRESHOLD PULSEWIDTH: 0.5 ms
MDC IDC MSMT LEADCHNL RV SENSING INTR AMPL: 12 mV
MDC IDC PG SERIAL: 8951116
MDC IDC SET LEADCHNL RA PACING AMPLITUDE: 2 V
MDC IDC STAT BRADY AP VS PERCENT: 9.9 %
MDC IDC STAT BRADY AS VP PERCENT: 40 %
MDC IDC STAT BRADY AS VS PERCENT: 41 %
Pulse Gen Model: 2272

## 2017-08-04 DIAGNOSIS — M9902 Segmental and somatic dysfunction of thoracic region: Secondary | ICD-10-CM | POA: Diagnosis not present

## 2017-08-04 DIAGNOSIS — M9904 Segmental and somatic dysfunction of sacral region: Secondary | ICD-10-CM | POA: Diagnosis not present

## 2017-08-04 DIAGNOSIS — M5441 Lumbago with sciatica, right side: Secondary | ICD-10-CM | POA: Diagnosis not present

## 2017-08-04 DIAGNOSIS — M5136 Other intervertebral disc degeneration, lumbar region: Secondary | ICD-10-CM | POA: Diagnosis not present

## 2017-08-04 DIAGNOSIS — M546 Pain in thoracic spine: Secondary | ICD-10-CM | POA: Diagnosis not present

## 2017-08-04 DIAGNOSIS — M5137 Other intervertebral disc degeneration, lumbosacral region: Secondary | ICD-10-CM | POA: Diagnosis not present

## 2017-08-04 DIAGNOSIS — M9903 Segmental and somatic dysfunction of lumbar region: Secondary | ICD-10-CM | POA: Diagnosis not present

## 2017-08-06 DIAGNOSIS — M9903 Segmental and somatic dysfunction of lumbar region: Secondary | ICD-10-CM | POA: Diagnosis not present

## 2017-08-06 DIAGNOSIS — M9904 Segmental and somatic dysfunction of sacral region: Secondary | ICD-10-CM | POA: Diagnosis not present

## 2017-08-06 DIAGNOSIS — M546 Pain in thoracic spine: Secondary | ICD-10-CM | POA: Diagnosis not present

## 2017-08-06 DIAGNOSIS — M5441 Lumbago with sciatica, right side: Secondary | ICD-10-CM | POA: Diagnosis not present

## 2017-08-06 DIAGNOSIS — M5137 Other intervertebral disc degeneration, lumbosacral region: Secondary | ICD-10-CM | POA: Diagnosis not present

## 2017-08-06 DIAGNOSIS — M5136 Other intervertebral disc degeneration, lumbar region: Secondary | ICD-10-CM | POA: Diagnosis not present

## 2017-08-06 DIAGNOSIS — M9902 Segmental and somatic dysfunction of thoracic region: Secondary | ICD-10-CM | POA: Diagnosis not present

## 2017-08-07 ENCOUNTER — Other Ambulatory Visit: Payer: Self-pay | Admitting: Physician Assistant

## 2017-08-07 DIAGNOSIS — Z952 Presence of prosthetic heart valve: Secondary | ICD-10-CM

## 2017-08-08 DIAGNOSIS — M5136 Other intervertebral disc degeneration, lumbar region: Secondary | ICD-10-CM | POA: Diagnosis not present

## 2017-08-08 DIAGNOSIS — M5137 Other intervertebral disc degeneration, lumbosacral region: Secondary | ICD-10-CM | POA: Diagnosis not present

## 2017-08-08 DIAGNOSIS — M546 Pain in thoracic spine: Secondary | ICD-10-CM | POA: Diagnosis not present

## 2017-08-08 DIAGNOSIS — M5441 Lumbago with sciatica, right side: Secondary | ICD-10-CM | POA: Diagnosis not present

## 2017-08-08 DIAGNOSIS — M9904 Segmental and somatic dysfunction of sacral region: Secondary | ICD-10-CM | POA: Diagnosis not present

## 2017-08-08 DIAGNOSIS — M9902 Segmental and somatic dysfunction of thoracic region: Secondary | ICD-10-CM | POA: Diagnosis not present

## 2017-08-08 DIAGNOSIS — M9903 Segmental and somatic dysfunction of lumbar region: Secondary | ICD-10-CM | POA: Diagnosis not present

## 2017-08-11 DIAGNOSIS — M9902 Segmental and somatic dysfunction of thoracic region: Secondary | ICD-10-CM | POA: Diagnosis not present

## 2017-08-11 DIAGNOSIS — M9903 Segmental and somatic dysfunction of lumbar region: Secondary | ICD-10-CM | POA: Diagnosis not present

## 2017-08-11 DIAGNOSIS — M5136 Other intervertebral disc degeneration, lumbar region: Secondary | ICD-10-CM | POA: Diagnosis not present

## 2017-08-11 DIAGNOSIS — M5137 Other intervertebral disc degeneration, lumbosacral region: Secondary | ICD-10-CM | POA: Diagnosis not present

## 2017-08-11 DIAGNOSIS — M5441 Lumbago with sciatica, right side: Secondary | ICD-10-CM | POA: Diagnosis not present

## 2017-08-11 DIAGNOSIS — M546 Pain in thoracic spine: Secondary | ICD-10-CM | POA: Diagnosis not present

## 2017-08-11 DIAGNOSIS — M9904 Segmental and somatic dysfunction of sacral region: Secondary | ICD-10-CM | POA: Diagnosis not present

## 2017-08-13 DIAGNOSIS — M5441 Lumbago with sciatica, right side: Secondary | ICD-10-CM | POA: Diagnosis not present

## 2017-08-13 DIAGNOSIS — M9902 Segmental and somatic dysfunction of thoracic region: Secondary | ICD-10-CM | POA: Diagnosis not present

## 2017-08-13 DIAGNOSIS — M5137 Other intervertebral disc degeneration, lumbosacral region: Secondary | ICD-10-CM | POA: Diagnosis not present

## 2017-08-13 DIAGNOSIS — M5136 Other intervertebral disc degeneration, lumbar region: Secondary | ICD-10-CM | POA: Diagnosis not present

## 2017-08-13 DIAGNOSIS — M9904 Segmental and somatic dysfunction of sacral region: Secondary | ICD-10-CM | POA: Diagnosis not present

## 2017-08-13 DIAGNOSIS — M546 Pain in thoracic spine: Secondary | ICD-10-CM | POA: Diagnosis not present

## 2017-08-13 DIAGNOSIS — M9903 Segmental and somatic dysfunction of lumbar region: Secondary | ICD-10-CM | POA: Diagnosis not present

## 2017-08-15 DIAGNOSIS — M9902 Segmental and somatic dysfunction of thoracic region: Secondary | ICD-10-CM | POA: Diagnosis not present

## 2017-08-15 DIAGNOSIS — M9903 Segmental and somatic dysfunction of lumbar region: Secondary | ICD-10-CM | POA: Diagnosis not present

## 2017-08-15 DIAGNOSIS — M5441 Lumbago with sciatica, right side: Secondary | ICD-10-CM | POA: Diagnosis not present

## 2017-08-15 DIAGNOSIS — M5137 Other intervertebral disc degeneration, lumbosacral region: Secondary | ICD-10-CM | POA: Diagnosis not present

## 2017-08-15 DIAGNOSIS — M5136 Other intervertebral disc degeneration, lumbar region: Secondary | ICD-10-CM | POA: Diagnosis not present

## 2017-08-15 DIAGNOSIS — M546 Pain in thoracic spine: Secondary | ICD-10-CM | POA: Diagnosis not present

## 2017-08-15 DIAGNOSIS — M9904 Segmental and somatic dysfunction of sacral region: Secondary | ICD-10-CM | POA: Diagnosis not present

## 2017-08-18 DIAGNOSIS — M5441 Lumbago with sciatica, right side: Secondary | ICD-10-CM | POA: Diagnosis not present

## 2017-08-18 DIAGNOSIS — M9903 Segmental and somatic dysfunction of lumbar region: Secondary | ICD-10-CM | POA: Diagnosis not present

## 2017-08-18 DIAGNOSIS — M546 Pain in thoracic spine: Secondary | ICD-10-CM | POA: Diagnosis not present

## 2017-08-18 DIAGNOSIS — M9902 Segmental and somatic dysfunction of thoracic region: Secondary | ICD-10-CM | POA: Diagnosis not present

## 2017-08-18 DIAGNOSIS — M5136 Other intervertebral disc degeneration, lumbar region: Secondary | ICD-10-CM | POA: Diagnosis not present

## 2017-08-18 DIAGNOSIS — M5137 Other intervertebral disc degeneration, lumbosacral region: Secondary | ICD-10-CM | POA: Diagnosis not present

## 2017-08-18 DIAGNOSIS — M9904 Segmental and somatic dysfunction of sacral region: Secondary | ICD-10-CM | POA: Diagnosis not present

## 2017-08-20 DIAGNOSIS — M5441 Lumbago with sciatica, right side: Secondary | ICD-10-CM | POA: Diagnosis not present

## 2017-08-20 DIAGNOSIS — M9902 Segmental and somatic dysfunction of thoracic region: Secondary | ICD-10-CM | POA: Diagnosis not present

## 2017-08-20 DIAGNOSIS — M546 Pain in thoracic spine: Secondary | ICD-10-CM | POA: Diagnosis not present

## 2017-08-20 DIAGNOSIS — M9904 Segmental and somatic dysfunction of sacral region: Secondary | ICD-10-CM | POA: Diagnosis not present

## 2017-08-20 DIAGNOSIS — M5137 Other intervertebral disc degeneration, lumbosacral region: Secondary | ICD-10-CM | POA: Diagnosis not present

## 2017-08-20 DIAGNOSIS — M5136 Other intervertebral disc degeneration, lumbar region: Secondary | ICD-10-CM | POA: Diagnosis not present

## 2017-08-20 DIAGNOSIS — M9903 Segmental and somatic dysfunction of lumbar region: Secondary | ICD-10-CM | POA: Diagnosis not present

## 2017-08-22 DIAGNOSIS — M5441 Lumbago with sciatica, right side: Secondary | ICD-10-CM | POA: Diagnosis not present

## 2017-08-22 DIAGNOSIS — M9903 Segmental and somatic dysfunction of lumbar region: Secondary | ICD-10-CM | POA: Diagnosis not present

## 2017-08-22 DIAGNOSIS — M5136 Other intervertebral disc degeneration, lumbar region: Secondary | ICD-10-CM | POA: Diagnosis not present

## 2017-08-22 DIAGNOSIS — M9902 Segmental and somatic dysfunction of thoracic region: Secondary | ICD-10-CM | POA: Diagnosis not present

## 2017-08-22 DIAGNOSIS — M5137 Other intervertebral disc degeneration, lumbosacral region: Secondary | ICD-10-CM | POA: Diagnosis not present

## 2017-08-22 DIAGNOSIS — M9904 Segmental and somatic dysfunction of sacral region: Secondary | ICD-10-CM | POA: Diagnosis not present

## 2017-08-22 DIAGNOSIS — M546 Pain in thoracic spine: Secondary | ICD-10-CM | POA: Diagnosis not present

## 2017-09-02 DIAGNOSIS — Z23 Encounter for immunization: Secondary | ICD-10-CM | POA: Diagnosis not present

## 2017-09-03 DIAGNOSIS — M5441 Lumbago with sciatica, right side: Secondary | ICD-10-CM | POA: Diagnosis not present

## 2017-09-03 DIAGNOSIS — M546 Pain in thoracic spine: Secondary | ICD-10-CM | POA: Diagnosis not present

## 2017-09-03 DIAGNOSIS — M5136 Other intervertebral disc degeneration, lumbar region: Secondary | ICD-10-CM | POA: Diagnosis not present

## 2017-09-03 DIAGNOSIS — M9902 Segmental and somatic dysfunction of thoracic region: Secondary | ICD-10-CM | POA: Diagnosis not present

## 2017-09-03 DIAGNOSIS — M9903 Segmental and somatic dysfunction of lumbar region: Secondary | ICD-10-CM | POA: Diagnosis not present

## 2017-09-03 DIAGNOSIS — M5137 Other intervertebral disc degeneration, lumbosacral region: Secondary | ICD-10-CM | POA: Diagnosis not present

## 2017-09-03 DIAGNOSIS — M9904 Segmental and somatic dysfunction of sacral region: Secondary | ICD-10-CM | POA: Diagnosis not present

## 2017-09-24 DIAGNOSIS — M9904 Segmental and somatic dysfunction of sacral region: Secondary | ICD-10-CM | POA: Diagnosis not present

## 2017-09-24 DIAGNOSIS — M5137 Other intervertebral disc degeneration, lumbosacral region: Secondary | ICD-10-CM | POA: Diagnosis not present

## 2017-09-24 DIAGNOSIS — M546 Pain in thoracic spine: Secondary | ICD-10-CM | POA: Diagnosis not present

## 2017-09-24 DIAGNOSIS — M9902 Segmental and somatic dysfunction of thoracic region: Secondary | ICD-10-CM | POA: Diagnosis not present

## 2017-09-24 DIAGNOSIS — M5136 Other intervertebral disc degeneration, lumbar region: Secondary | ICD-10-CM | POA: Diagnosis not present

## 2017-09-24 DIAGNOSIS — M9903 Segmental and somatic dysfunction of lumbar region: Secondary | ICD-10-CM | POA: Diagnosis not present

## 2017-09-24 DIAGNOSIS — M5441 Lumbago with sciatica, right side: Secondary | ICD-10-CM | POA: Diagnosis not present

## 2017-10-14 NOTE — Progress Notes (Signed)
[facility] [place]                                       Cardiology Office Note    Date:  10/16/2017   ID:  [name], DOB Jan 30, 1935, MRN [id number]  PCP:  Maylon Cos, NP  Cardiologist: Dr. Agustin Cree / Dr. Burt Knack (TAVR)/ Dr. Rayann Heman (EP)  CC: 1 year s/p TAVR  History of Present Illness:  Robin Sutton is a 82 y.o. female with a history of HTN, HLD, previous CVA and severe ASs/p TAVR (07/37/10) complicated by CHB s/p PPM (10/20/16) who presents to clinic for follow up.   The patient states that she has known about the presence of a heart murmur for several years. 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 She still has very mild right-sided weakness but overall recovered uneventfully. She has remained entirely functionally independent although she admits that she lives a somewhat sedentary lifestyle. Over the past several months she has developed symptoms of exertional shortness breath and fatigue. She was seen in follow-up by her primary care physician and noted to have a prominent murmur on physical exam. She was referred to Dr. Agustin Cree and underwent transthoracic echocardiogram that revealed severe aortic stenosis with preserved left ventricular systolic function. She subsequently underwent left and right heart catheterization by Dr. Burt Knack confirming the presence of at least moderate aortic stenosis with mild nonobstructive coronary artery disease.  She was referred to the multidisciplinary valve team who ultimately recommended TAVR. She underwentsuccessful TAVR with a 85mm Medtronic Evolut Pro via the R TF approach on 10/17/16.She developed symptomatic high grade heart blockrequiringpacemaker placement by Dr. Rayann Heman on 10/20/16.She was dischargedon ASA and plavix. 1 month echo showed normal valve function with improvements in transaortic gradients and mild PVL; mean  grad 13, peak grad 29.   She had a fall with a prolonged down time this summer. Luckily she was okay. Now she has a cell phone with her at all times.   Today she presents to clinic for follow up. She has been overall doing pretty well. No CP or SOB. No LE edema, orthopnea or PND. No dizziness or syncope. No blood in stool or urine. No palpitations. She does have a lot of fatigue that she deals with. She has not been exercising at all and thinks this could help her. She is planning on rejoining the senior center. She had complained of fatigue at her 1 month appointment s/p TAVR and I had checked a TSH and blood counts which were stable. She doesn't think she snores and is able to make it through television shows without falling asleep. She just wishes she had more energy. She does admit to really missing her husband who passed 5 years ago from [employer].     Past Medical History:  Diagnosis Date  . History of CVA (cerebrovascular accident)   . HLD (hyperlipidemia)   . Hypertension   . S/P placement of cardiac pacemaker    a. developed CHB s/p TAVR and got a St. Jude Medical Assurity MRI compatible dual chamber [id number] (serial number  [id number])  . S/P TAVR (transcatheter aortic valve replacement)    a. 10/2016: 23 mm Medtronic Evolut Pro transcatheter heart valve placed via percutaneous right transfemoral approach     Past Surgical History:  Procedure Laterality Date  .  BREAST BIOPSY    . PACEMAKER IMPLANT N/A 10/20/2016   St Jude Medical Assurity MRI conditional  dual-chamber pacemaker for symptomatic complete heart block by Dr Rayann Heman  . RIGHT/LEFT HEART CATH AND CORONARY ANGIOGRAPHY N/A 09/11/2016   Procedure: RIGHT/LEFT HEART CATH AND CORONARY ANGIOGRAPHY;  Surgeon: Sherren Mocha, MD;  Location: [facility];  Service: Cardiovascular;  Laterality: N/A;  . TEE WITHOUT CARDIOVERSION N/A 10/17/2016   Procedure: TRANSESOPHAGEAL ECHOCARDIOGRAM (TEE);  Surgeon: Sherren Mocha, MD;   Location: [facility];  Service: Open Heart Surgery;  Laterality: N/A;  . TONSILLECTOMY    . TRANSCATHETER AORTIC VALVE REPLACEMENT, TRANSFEMORAL N/A 10/17/2016   Procedure: TRANSCATHETER AORTIC VALVE REPLACEMENT, TRANSFEMORAL;  Surgeon: Sherren Mocha, MD;  Location: [facility];  Service: Open Heart Surgery;  Laterality: N/A;    Current Medications: Outpatient Medications Prior to Visit  Medication Sig Dispense Refill  . amoxicillin (AMOXIL) 500 MG capsule Take 500 mg by mouth 3 (three) times daily. Take 4 capsules by mouth once as needed for dental work    . aspirin EC 81 MG EC tablet Take 1 tablet (81 mg total) by mouth daily.    . Coenzyme Q10 200 MG capsule Take 200 mg by mouth daily.    Marland Kitchen lisinopril (PRINIVIL,ZESTRIL) 10 MG tablet Take 10 mg by mouth every evening.   1  . nitroGLYCERIN (NITROSTAT) 0.4 MG SL tablet Place 1 tablet (0.4 mg total) under the tongue every 5 (five) minutes as needed for chest pain. 25 tablet 3  . omeprazole (PRILOSEC) 40 MG capsule Take 40 mg by mouth daily as needed.    . polyethylene glycol powder (GLYCOLAX/MIRALAX) powder Take 17 g by mouth daily as needed (consitipation).     . pravastatin (PRAVACHOL) 40 MG tablet Take 40 mg by mouth every evening.   1  . Vitamin D, Ergocalciferol, (DRISDOL) 50000 units CAPS capsule Take 50,000 Units by mouth every Monday.   1  . docusate sodium (COLACE) 100 MG capsule Take 100-300 mg by mouth daily as needed for mild constipation.     No facility-administered medications prior to visit.      Allergies:   Adhesive [tape]   Social History   Socioeconomic History  . Marital status: Widowed    Spouse name: Not on file  . Number of children: Not on file  . Years of education: Not on file  . Highest education level: Not on file  Occupational History  . Not on file  Social Needs  . Financial resource strain: Not on file  . Food insecurity:    Worry: Not on file    Inability: Not on file  . Transportation needs:     Medical: Not on file    Non-medical: Not on file  Tobacco Use  . Smoking status: Never Smoker  . Smokeless tobacco: Never Used  Substance and Sexual Activity  . Alcohol use: No  . Drug use: No  . Sexual activity: Not on file  Lifestyle  . Physical activity:    Days per week: Not on file    Minutes per session: Not on file  . Stress: Not on file  Relationships  . Social connections:    Talks on phone: Not on file    Gets together: Not on file    Attends religious service: Not on file    Active member of club or organization: Not on file    Attends meetings of clubs or organizations: Not on file    Relationship status:  Not on file  Other Topics Concern  . Not on file  Social History Narrative  . Not on file     Family History:  The patient's family history includes Diabetes in her maternal grandfather; Heart disease in her brother, maternal grandfather, and mother; Hypertension in her brother and mother; Kidney failure in her father; Stroke in her mother.     ROS:   Please see the history of present illness.    ROS All other systems reviewed and are negative.   PHYSICAL EXAM:   VS:  BP 132/62   Pulse 73   Ht 5\' 3"  (1.6 m)   Wt 169 lb 12.8 oz (77 kg)   SpO2 97%   BMI 30.08 kg/m    GEN: Well nourished, well developed, in no acute distress HEENT: normal Neck: no JVD or masses Cardiac: RRR; soft flow murmur. No rubs, or gallops,no edema  Respiratory:  clear to auscultation bilaterally, normal work of breathing GI: soft, nontender, nondistended, + BS MS: no deformity or atrophy Skin: warm and dry, no rash Neuro:  Alert and Oriented x 3, Strength and sensation are intact Psych: euthymic mood, full affect    Wt Readings from Last 3 Encounters:  10/16/17 169 lb 12.8 oz (77 kg)  05/08/17 171 lb 6.4 oz (77.7 kg)  02/06/17 173 lb (78.5 kg)      Studies/Labs Reviewed:   EKG:  EKG is NOT ordered today.    Recent Labs: 10/17/2016: ALT 16 10/18/2016: Magnesium  2.1 10/31/2016: BUN 19; Creatinine, Ser 1.28; Hemoglobin 12.9; Platelets 191; Potassium 4.3; Sodium 137; TSH 0.354   Lipid Panel No results found for: CHOL, TRIG, HDL, CHOLHDL, VLDL, LDLCALC, LDLDIRECT  Additional studies/ records that were reviewed today include:  TAVR OPERATIVE NOTE Date of Procedure:10/17/2016   Transcatheter Aortic Valve Replacement - Percutaneous Transfemoral Approach Medtronic Evolut ProTHV (size 97mm, serial # [id number]) Pre-operative Echo Findings: ? Severe aortic stenosis ? Vigorous left ventricular systolic function  Post-operative Echo Findings: ? Noparavalvular leak ? unchangedleft ventricular systolic function  _____________  2D ECHO 11/13/16:  Study Conclusions - Left ventricle: Systolic function was normal. The estimated ejection fraction was in the range of 60% to 65%. The study is not technically sufficient to allow evaluation of LV diastolic function. - Aortic valve: Post TAVR with 23 mm Evolut Pro. Gradients have come down since last echo mean 23 mmHg to 13 mmHg and peak 39 mmHg to 29 mmHg mild peri valvular regurgitaitons remains. - Mitral valve: Calcified annulus. Moderately thickened, moderately calcified leaflets . The findings are consistent with mild stenosis. Valve area by pressure half-time: 1.65 cm^2. - Atrial septum: No defect or patent foramen ovale was identified.  _____________  Echo 10/16/17 ( 1 year s/p TAVR) Study Conclusions - Left ventricle: The cavity size was normal. There was mild   concentric hypertrophy. Systolic function was normal. The   estimated ejection fraction was in the range of 60% to 65%. Wall   motion was normal; there were no regional wall motion   abnormalities. Doppler parameters are consistent with abnormal   left ventricular relaxation (grade 1 diastolic dysfunction).   Doppler parameters are consistent with elevated mean left atrial   filling  pressure. - Ventricular septum: Septal motion showed paradox. These changes   are consistent with right ventricular pacing. - Aortic valve: A TAVR stent-valve bioprosthesis was present. - Mitral valve: Calcified annulus. There was mild regurgitation.   Valve area by pressure half-time: 1.75 cm^2. Valve area by  continuity equation (using LVOT flow): 1.72 cm^2. - Left atrium: The atrium was mildly dilated. Impressions: - Aortic prosthesis gradients have improved slightly since the last   study.   ASSESSMENT & PLAN:   Severe AS s/p TAVR: 2D ECHO today shows EF 60%, normally functioning TAVR with no PVL and mean gradient 12 mm Hg (slightly improved from previous study). She has NYHA class II symptoms, mainly of fatigue. She has amoxicillin for SBE prophylaxis. Continue on aspirin indefinitely.   CHB s/p PPM: followed by [facility]  HTN:Bp well controlled.   XKP:VVZSMOLM statin   Medication Adjustments/Labs and Tests Ordered: Current medicines are reviewed at length with the patient today.  Concerns regarding medicines are outlined above.  Medication changes, Labs and Tests ordered today are listed in the Patient Instructions below. Patient Instructions  Medication Instructions:  Your provider recommends that you continue on your current medications as directed. Please refer to the Current Medication list given to you today.    Labwork: None  Testing/Procedures: None  Follow-Up: Your provider recommends that you schedule a follow-up appointment AS NEEDED with the Structural Heart Team!  Any Other Special Instructions Will Be Listed Below (If Applicable).     If you need a refill on your cardiac medications before your next appointment, please call your pharmacy.      Signed, Angelena Form, PA-C  10/16/2017 7:45 PM    [facility] [address], [place], [place]  [address] Phone: [phone]; Fax: [phone]

## 2017-10-16 ENCOUNTER — Encounter: Payer: Self-pay | Admitting: Physician Assistant

## 2017-10-16 ENCOUNTER — Other Ambulatory Visit: Payer: Self-pay

## 2017-10-16 ENCOUNTER — Ambulatory Visit (INDEPENDENT_AMBULATORY_CARE_PROVIDER_SITE_OTHER): Payer: Medicare Other | Admitting: Physician Assistant

## 2017-10-16 ENCOUNTER — Ambulatory Visit (HOSPITAL_COMMUNITY): Payer: Medicare Other | Attending: Internal Medicine

## 2017-10-16 VITALS — BP 132/62 | HR 73 | Ht 63.0 in | Wt 169.8 lb

## 2017-10-16 DIAGNOSIS — E785 Hyperlipidemia, unspecified: Secondary | ICD-10-CM

## 2017-10-16 DIAGNOSIS — I10 Essential (primary) hypertension: Secondary | ICD-10-CM | POA: Diagnosis not present

## 2017-10-16 DIAGNOSIS — Z952 Presence of prosthetic heart valve: Secondary | ICD-10-CM

## 2017-10-16 DIAGNOSIS — Z950 Presence of cardiac pacemaker: Secondary | ICD-10-CM

## 2017-10-16 NOTE — Patient Instructions (Signed)
Medication Instructions:  Your provider recommends that you continue on your current medications as directed. Please refer to the Current Medication list given to you today.    Labwork: None  Testing/Procedures: None  Follow-Up: Your provider recommends that you schedule a follow-up appointment AS NEEDED with the Structural Heart Team!  Any Other Special Instructions Will Be Listed Below (If Applicable).     If you need a refill on your cardiac medications before your next appointment, please call your pharmacy.

## 2017-10-20 ENCOUNTER — Ambulatory Visit (INDEPENDENT_AMBULATORY_CARE_PROVIDER_SITE_OTHER): Payer: Medicare Other | Admitting: Cardiology

## 2017-10-20 ENCOUNTER — Encounter: Payer: Self-pay | Admitting: Cardiology

## 2017-10-20 VITALS — BP 120/64 | HR 77 | Ht 63.0 in | Wt 170.8 lb

## 2017-10-20 DIAGNOSIS — Z952 Presence of prosthetic heart valve: Secondary | ICD-10-CM

## 2017-10-20 DIAGNOSIS — Z8673 Personal history of transient ischemic attack (TIA), and cerebral infarction without residual deficits: Secondary | ICD-10-CM

## 2017-10-20 DIAGNOSIS — Z950 Presence of cardiac pacemaker: Secondary | ICD-10-CM

## 2017-10-20 DIAGNOSIS — E785 Hyperlipidemia, unspecified: Secondary | ICD-10-CM | POA: Diagnosis not present

## 2017-10-20 DIAGNOSIS — I10 Essential (primary) hypertension: Secondary | ICD-10-CM | POA: Diagnosis not present

## 2017-10-20 NOTE — Patient Instructions (Signed)
Medication Instructions:  Your physician recommends that you continue on your current medications as directed. Please refer to the Current Medication list given to you today.  If you need a refill on your cardiac medications before your next appointment, please call your pharmacy.   Lab work: None.  If you have labs (blood work) drawn today and your tests are completely normal, you will receive your results only by: . MyChart Message (if you have MyChart) OR . A paper copy in the mail If you have any lab test that is abnormal or we need to change your treatment, we will call you to review the results.  Testing/Procedures: None.    Follow-Up: At [facility], you and your health needs are our priority.  As part of our continuing mission to provide you with exceptional heart care, we have created designated Provider Care Teams.  These Care Teams include your primary Cardiologist (physician) and Advanced Practice Providers (APPs -  Physician Assistants and Nurse Practitioners) who all work together to provide you with the care you need, when you need it. You will need a follow up appointment in 5 months.  Please call our office 2 months in advance to schedule this appointment.  You may see Robert Krasowski, MD or another member of our [facility] Provider Team in [place]: Brian Munley, MD . Rajan Revankar, MD  Any Other Special Instructions Will Be Listed Below (If Applicable).     

## 2017-10-20 NOTE — Progress Notes (Signed)
Cardiology Office Note:    Date:  10/20/2017   ID:  Robin Sutton, DOB 02/17/1935, MRN [id number]  PCP:  Maylon Cos, NP  Cardiologist:  Jenne Campus, MD    Referring MD: Maylon Cos, NP   Chief Complaint  Patient presents with  . Follow up on Echo  Doing very well cardiac wise  History of Present Illness:    Robin Sutton is a 82 y.o. female with history of severe aortic stenosis status post trans-angiographic valve implantation as well as pacemaker implantation doing well last echocardiogram done just few weeks ago reviewed gradient across aortic valve actually improved overall denies have any chest pain tightness squeezing pressure burning chest no palpitations.  In the summer she was trying to change her bird feeder and she ended up falling off the chair that she was standing on sustained some injury but did not look for any help except chiropractor.  Recovered completely from it and doing well now.  Asymptomatic.  Past Medical History:  Diagnosis Date  . History of CVA (cerebrovascular accident)   . HLD (hyperlipidemia)   . Hypertension   . S/P placement of cardiac pacemaker    a. developed CHB s/p TAVR and got a St. Jude Medical Assurity MRI compatible dual chamber [id number] (serial number  [id number])  . S/P TAVR (transcatheter aortic valve replacement)    a. 10/2016: 23 mm Medtronic Evolut Pro transcatheter heart valve placed via percutaneous right transfemoral approach     Past Surgical History:  Procedure Laterality Date  . BREAST BIOPSY    . PACEMAKER IMPLANT N/A 10/20/2016   St Jude Medical Assurity MRI conditional  dual-chamber pacemaker for symptomatic complete heart block by Dr Rayann Heman  . RIGHT/LEFT HEART CATH AND CORONARY ANGIOGRAPHY N/A 09/11/2016   Procedure: RIGHT/LEFT HEART CATH AND CORONARY ANGIOGRAPHY;  Surgeon: Sherren Mocha, MD;  Location: [facility];  Service: Cardiovascular;  Laterality: N/A;  . TEE WITHOUT CARDIOVERSION N/A  10/17/2016   Procedure: TRANSESOPHAGEAL ECHOCARDIOGRAM (TEE);  Surgeon: Sherren Mocha, MD;  Location: [facility];  Service: Open Heart Surgery;  Laterality: N/A;  . TONSILLECTOMY    . TRANSCATHETER AORTIC VALVE REPLACEMENT, TRANSFEMORAL N/A 10/17/2016   Procedure: TRANSCATHETER AORTIC VALVE REPLACEMENT, TRANSFEMORAL;  Surgeon: Sherren Mocha, MD;  Location: [facility];  Service: Open Heart Surgery;  Laterality: N/A;    Current Medications: Current Meds  Medication Sig  . amoxicillin (AMOXIL) 500 MG capsule Take 500 mg by mouth 3 (three) times daily. Take 4 capsules by mouth once as needed for dental work  . aspirin EC 81 MG EC tablet Take 1 tablet (81 mg total) by mouth daily.  . Coenzyme Q10 200 MG capsule Take 200 mg by mouth daily.  Marland Kitchen lisinopril (PRINIVIL,ZESTRIL) 10 MG tablet Take 10 mg by mouth every evening.   . nitroGLYCERIN (NITROSTAT) 0.4 MG SL tablet Place 1 tablet (0.4 mg total) under the tongue every 5 (five) minutes as needed for chest pain.  Marland Kitchen omeprazole (PRILOSEC) 40 MG capsule Take 40 mg by mouth daily as needed.  . polyethylene glycol powder (GLYCOLAX/MIRALAX) powder Take 17 g by mouth daily as needed (consitipation).   . pravastatin (PRAVACHOL) 80 MG tablet Take 80 mg by mouth every evening.   . Vitamin D, Ergocalciferol, (DRISDOL) 50000 units CAPS capsule Take 50,000 Units by mouth every Monday.      Allergies:   Adhesive [tape]   Social History   Socioeconomic History  . Marital status: Widowed    Spouse name: Not  on file  . Number of children: Not on file  . Years of education: Not on file  . Highest education level: Not on file  Occupational History  . Not on file  Social Needs  . Financial resource strain: Not on file  . Food insecurity:    Worry: Not on file    Inability: Not on file  . Transportation needs:    Medical: Not on file    Non-medical: Not on file  Tobacco Use  . Smoking status: Never Smoker  . Smokeless tobacco: Never Used  Substance and  Sexual Activity  . Alcohol use: No  . Drug use: No  . Sexual activity: Not on file  Lifestyle  . Physical activity:    Days per week: Not on file    Minutes per session: Not on file  . Stress: Not on file  Relationships  . Social connections:    Talks on phone: Not on file    Gets together: Not on file    Attends religious service: Not on file    Active member of club or organization: Not on file    Attends meetings of clubs or organizations: Not on file    Relationship status: Not on file  Other Topics Concern  . Not on file  Social History Narrative  . Not on file     Family History: The patient's family history includes Diabetes in her maternal grandfather; Heart disease in her brother, maternal grandfather, and mother; Hypertension in her brother and mother; Kidney failure in her father; Stroke in her mother. ROS:   Please see the history of present illness.    All 14 point review of systems negative except as described per history of present illness  EKGs/Labs/Other Studies Reviewed:      Recent Labs: 10/31/2016: BUN 19; Creatinine, Ser 1.28; Hemoglobin 12.9; Platelets 191; Potassium 4.3; Sodium 137; TSH 0.354  Recent Lipid Panel No results found for: CHOL, TRIG, HDL, CHOLHDL, VLDL, LDLCALC, LDLDIRECT  Physical Exam:    VS:  BP 120/64   Pulse 77   Ht 5\' 3"  (1.6 m)   Wt 170 lb 12.8 oz (77.5 kg)   SpO2 97%   BMI 30.26 kg/m     Wt Readings from Last 3 Encounters:  10/20/17 170 lb 12.8 oz (77.5 kg)  10/16/17 169 lb 12.8 oz (77 kg)  05/08/17 171 lb 6.4 oz (77.7 kg)     GEN:  Well nourished, well developed in no acute distress HEENT: Normal NECK: No JVD; No carotid bruits LYMPHATICS: No lymphadenopathy CARDIAC: RRR, soft systolic murmur grade 1/6 best heard at the right upper portion of the sternum, no rubs, no gallops RESPIRATORY:  Clear to auscultation without rales, wheezing or rhonchi  ABDOMEN: Soft, non-tender, non-distended MUSCULOSKELETAL:  No edema;  No deformity  SKIN: Warm and dry LOWER EXTREMITIES: no swelling NEUROLOGIC:  Alert and oriented x 3 PSYCHIATRIC:  Normal affect   ASSESSMENT:    1. S/P TAVR (transcatheter aortic valve replacement)   2. Pacemaker   3. History of CVA (cerebrovascular accident)   4. Dyslipidemia   5. Essential hypertension    PLAN:    In order of problems listed above:  1. Status post TAVR is doing well last echocardiogram showed normal function with mild. 2. Pacemaker recently interrogated functioning properly 3. History of CVA doing well from that point review that happened many years ago no new recurrences. 4. Dyslipidemia on statin which I will continue. 5. Essential hypertension blood  pressure well controlled continue present management.   Medication Adjustments/Labs and Tests Ordered: Current medicines are reviewed at length with the patient today.  Concerns regarding medicines are outlined above.  No orders of the defined types were placed in this encounter.  Medication changes: No orders of the defined types were placed in this encounter.   Signed, Park Liter, MD, [facility] 10/20/2017 11:46 AM    [place]

## 2017-10-21 ENCOUNTER — Encounter: Payer: Self-pay | Admitting: Thoracic Surgery (Cardiothoracic Vascular Surgery)

## 2017-10-22 ENCOUNTER — Ambulatory Visit (INDEPENDENT_AMBULATORY_CARE_PROVIDER_SITE_OTHER): Payer: Medicare Other | Admitting: *Deleted

## 2017-10-22 DIAGNOSIS — I6930 Unspecified sequelae of cerebral infarction: Secondary | ICD-10-CM

## 2017-10-22 DIAGNOSIS — I442 Atrioventricular block, complete: Secondary | ICD-10-CM

## 2017-10-22 NOTE — Progress Notes (Signed)
Remote pacemaker transmission.   

## 2017-10-24 ENCOUNTER — Encounter: Payer: Self-pay | Admitting: Cardiology

## 2017-12-27 LAB — CUP PACEART REMOTE DEVICE CHECK
Brady Statistic AP VP Percent: 12 %
Brady Statistic AP VS Percent: 7.3 %
Brady Statistic AS VP Percent: 50 %
Brady Statistic AS VS Percent: 30 %
Brady Statistic RV Percent Paced: 62 %
Implantable Lead Implant Date: 20181014
Implantable Lead Location: 753859
Lead Channel Impedance Value: 440 Ohm
Lead Channel Pacing Threshold Amplitude: 0.625 V
Lead Channel Sensing Intrinsic Amplitude: 12 mV
Lead Channel Setting Pacing Amplitude: 2 V
Lead Channel Setting Pacing Pulse Width: 0.5 ms
Lead Channel Setting Sensing Sensitivity: 4 mV
MDC IDC LEAD IMPLANT DT: 20181014
MDC IDC LEAD LOCATION: 753860
MDC IDC MSMT BATTERY REMAINING LONGEVITY: 116 mo
MDC IDC MSMT BATTERY REMAINING PERCENTAGE: 95.5 %
MDC IDC MSMT BATTERY VOLTAGE: 2.98 V
MDC IDC MSMT LEADCHNL RA PACING THRESHOLD AMPLITUDE: 0.75 V
MDC IDC MSMT LEADCHNL RA PACING THRESHOLD PULSEWIDTH: 0.5 ms
MDC IDC MSMT LEADCHNL RA SENSING INTR AMPL: 1.2 mV
MDC IDC MSMT LEADCHNL RV IMPEDANCE VALUE: 650 Ohm
MDC IDC MSMT LEADCHNL RV PACING THRESHOLD PULSEWIDTH: 0.5 ms
MDC IDC PG IMPLANT DT: 20181014
MDC IDC SESS DTM: 20191016073923
MDC IDC SET LEADCHNL RV PACING AMPLITUDE: 0.875
MDC IDC STAT BRADY RA PERCENT PACED: 18 %
Pulse Gen Model: 2272
Pulse Gen Serial Number: 8951116

## 2018-01-21 ENCOUNTER — Ambulatory Visit (INDEPENDENT_AMBULATORY_CARE_PROVIDER_SITE_OTHER): Payer: Medicare Other

## 2018-01-21 DIAGNOSIS — I442 Atrioventricular block, complete: Secondary | ICD-10-CM

## 2018-01-22 NOTE — Progress Notes (Signed)
Remote pacemaker transmission.   

## 2018-01-25 LAB — CUP PACEART REMOTE DEVICE CHECK
Battery Remaining Longevity: 116 mo
Brady Statistic AP VP Percent: 14 %
Brady Statistic AP VS Percent: 5.8 %
Brady Statistic AS VP Percent: 56 %
Brady Statistic RV Percent Paced: 70 %
Implantable Lead Implant Date: 20181014
Implantable Lead Implant Date: 20181014
Implantable Lead Location: 753859
Lead Channel Impedance Value: 440 Ohm
Lead Channel Impedance Value: 650 Ohm
Lead Channel Pacing Threshold Amplitude: 0.625 V
Lead Channel Sensing Intrinsic Amplitude: 9.6 mV
Lead Channel Setting Pacing Amplitude: 2 V
Lead Channel Setting Pacing Pulse Width: 0.5 ms
MDC IDC LEAD LOCATION: 753860
MDC IDC MSMT BATTERY REMAINING PERCENTAGE: 95.5 %
MDC IDC MSMT BATTERY VOLTAGE: 2.99 V
MDC IDC MSMT LEADCHNL RA PACING THRESHOLD AMPLITUDE: 0.75 V
MDC IDC MSMT LEADCHNL RA PACING THRESHOLD PULSEWIDTH: 0.5 ms
MDC IDC MSMT LEADCHNL RA SENSING INTR AMPL: 2 mV
MDC IDC MSMT LEADCHNL RV PACING THRESHOLD PULSEWIDTH: 0.5 ms
MDC IDC PG IMPLANT DT: 20181014
MDC IDC PG SERIAL: 8951116
MDC IDC SESS DTM: 20200115070013
MDC IDC SET LEADCHNL RV PACING AMPLITUDE: 0.875
MDC IDC SET LEADCHNL RV SENSING SENSITIVITY: 4 mV
MDC IDC STAT BRADY AS VS PERCENT: 24 %
MDC IDC STAT BRADY RA PERCENT PACED: 18 %
Pulse Gen Model: 2272

## 2018-02-04 ENCOUNTER — Encounter: Payer: Medicare Other | Admitting: Nurse Practitioner

## 2018-02-04 NOTE — Progress Notes (Signed)
Electrophysiology Office Note Date: 02/05/2018  ID:  Robin Sutton, DOB Jun 08, 1935, MRN [id number]  PCP: Maylon Cos, NP Primary Cardiologist: Agustin Cree Electrophysiologist: Allred  CC: Pacemaker follow-up  Robin Sutton is a 83 y.o. female seen today for Dr Rayann Heman.  She presents today for routine electrophysiology followup.  Since last being seen in our clinic, the patient reports doing very well.  She denies chest pain, palpitations, dyspnea, PND, orthopnea, nausea, vomiting, dizziness, syncope, edema, weight gain, or early satiety.  Device History: STJ dual chamber PPM implanted 2018 for complete heart block   Past Medical History:  Diagnosis Date  . History of CVA (cerebrovascular accident)   . HLD (hyperlipidemia)   . Hypertension   . S/P placement of cardiac pacemaker    a. developed CHB s/p TAVR and got a St. Jude Medical Assurity MRI compatible dual chamber [id number] (serial number  [id number])  . S/P TAVR (transcatheter aortic valve replacement)    a. 10/2016: 23 mm Medtronic Evolut Pro transcatheter heart valve placed via percutaneous right transfemoral approach    Past Surgical History:  Procedure Laterality Date  . BREAST BIOPSY    . PACEMAKER IMPLANT N/A 10/20/2016   St Jude Medical Assurity MRI conditional  dual-chamber pacemaker for symptomatic complete heart block by Dr Rayann Heman  . RIGHT/LEFT HEART CATH AND CORONARY ANGIOGRAPHY N/A 09/11/2016   Procedure: RIGHT/LEFT HEART CATH AND CORONARY ANGIOGRAPHY;  Surgeon: Sherren Mocha, MD;  Location: [place] [facility];  Service: Cardiovascular;  Laterality: N/A;  . TEE WITHOUT CARDIOVERSION N/A 10/17/2016   Procedure: TRANSESOPHAGEAL ECHOCARDIOGRAM (TEE);  Surgeon: Sherren Mocha, MD;  Location: [place];  Service: Open Heart Surgery;  Laterality: N/A;  . TONSILLECTOMY    . TRANSCATHETER AORTIC VALVE REPLACEMENT, TRANSFEMORAL N/A 10/17/2016   Procedure: TRANSCATHETER AORTIC VALVE REPLACEMENT, TRANSFEMORAL;  Surgeon:  Sherren Mocha, MD;  Location: [place];  Service: Open Heart Surgery;  Laterality: N/A;    Current Outpatient Medications  Medication Sig Dispense Refill  . amoxicillin (AMOXIL) 500 MG capsule Take 500 mg by mouth 3 (three) times daily. Take 4 capsules by mouth once as needed for dental work    . aspirin EC 81 MG EC tablet Take 1 tablet (81 mg total) by mouth daily.    . Coenzyme Q10 200 MG capsule Take 200 mg by mouth daily.    Marland Kitchen lisinopril (PRINIVIL,ZESTRIL) 10 MG tablet Take 10 mg by mouth every evening.   1  . nitroGLYCERIN (NITROSTAT) 0.4 MG SL tablet Place 1 tablet (0.4 mg total) under the tongue every 5 (five) minutes as needed for chest pain. 25 tablet 3  . omeprazole (PRILOSEC) 40 MG capsule Take 40 mg by mouth daily as needed.    . polyethylene glycol powder (GLYCOLAX/MIRALAX) powder Take 17 g by mouth daily as needed (consitipation).     . pravastatin (PRAVACHOL) 80 MG tablet Take 1 tablet (80 mg total) by mouth every evening. 90 tablet 0  . Vitamin D, Ergocalciferol, (DRISDOL) 50000 units CAPS capsule Take 50,000 Units by mouth every Monday.   1   No current facility-administered medications for this visit.     Allergies:   Adhesive [tape]   Social History: Social History   Socioeconomic History  . Marital status: Widowed    Spouse name: Not on file  . Number of children: Not on file  . Years of education: Not on file  . Highest education level: Not on file  Occupational History  . Not on file  Social Needs  .  Financial resource strain: Not on file  . Food insecurity:    Worry: Not on file    Inability: Not on file  . Transportation needs:    Medical: Not on file    Non-medical: Not on file  Tobacco Use  . Smoking status: Never Smoker  . Smokeless tobacco: Never Used  Substance and Sexual Activity  . Alcohol use: No  . Drug use: No  . Sexual activity: Not on file  Lifestyle  . Physical activity:    Days per week: Not on file    Minutes per session: Not on  file  . Stress: Not on file  Relationships  . Social connections:    Talks on phone: Not on file    Gets together: Not on file    Attends religious service: Not on file    Active member of club or organization: Not on file    Attends meetings of clubs or organizations: Not on file    Relationship status: Not on file  . Intimate partner violence:    Fear of current or ex partner: Not on file    Emotionally abused: Not on file    Physically abused: Not on file    Forced sexual activity: Not on file  Other Topics Concern  . Not on file  Social History Narrative  . Not on file    Family History: Family History  Problem Relation Age of Onset  . Hypertension Mother   . Heart disease Mother   . Stroke Mother   . Kidney failure Father   . Hypertension Brother   . Heart disease Brother   . Heart disease Maternal Grandfather   . Diabetes Maternal Grandfather      Review of Systems: All other systems reviewed and are otherwise negative except as noted above.   Physical Exam: VS:  BP 126/80   Pulse 60   Ht 5\' 3"  (1.6 m)   Wt 171 lb 12.8 oz (77.9 kg)   SpO2 99%   BMI 30.43 kg/m  , BMI Body mass index is 30.43 kg/m.  GEN- The patient is elderly appearing, alert and oriented x 3 today.   HEENT: normocephalic, atraumatic; sclera clear, conjunctiva pink; hearing intact; oropharynx clear; neck supple  Lungs- Clear to ausculation bilaterally, normal work of breathing.  No wheezes, rales, rhonchi Heart- Regular rate and rhythm (paced) GI- soft, non-tender, non-distended, bowel sounds present  Extremities- no clubbing, cyanosis, or edema  MS- no significant deformity or atrophy Skin- warm and dry, no rash or lesion; PPM pocket well healed Psych- euthymic mood, full affect Neuro- strength and sensation are intact  PPM Interrogation- reviewed in detail today,  See PACEART report  EKG:  EKG is not ordered today.  Recent Labs: No results found for requested labs within last  8760 hours.   Wt Readings from Last 3 Encounters:  02/05/18 171 lb 12.8 oz (77.9 kg)  10/20/17 170 lb 12.8 oz (77.5 kg)  10/16/17 169 lb 12.8 oz (77 kg)     Other studies Reviewed: Additional studies/ records that were reviewed today include: Dr Fraser Din and Dr Jackalyn Lombard notes   Assessment and Plan:  1.  Complete heart block Normal PPM function See Pace Art report No changes today RV pacing percentage increased recently, patient is device dependent today   2.  Status post TAVR Followed by structural clinic  3.  HTN Stable No change required today    Current medicines are reviewed at length with the patient  today.   The patient does not have concerns regarding her medicines.  The following changes were made today:  none  Labs/ tests ordered today include: none No orders of the defined types were placed in this encounter.    Disposition:   Follow up with [facility], 1 year Dr Curt Bears (she lives in [place] and would rather follow there)   Signed, Chanetta Marshall, NP 02/05/2018 8:33 AM  [facility] [address] [place] [address] [phone] (office) [phone] (fax)

## 2018-02-05 ENCOUNTER — Ambulatory Visit (INDEPENDENT_AMBULATORY_CARE_PROVIDER_SITE_OTHER): Payer: Medicare Other | Admitting: Nurse Practitioner

## 2018-02-05 ENCOUNTER — Encounter: Payer: Self-pay | Admitting: Nurse Practitioner

## 2018-02-05 VITALS — BP 126/80 | HR 60 | Ht 63.0 in | Wt 171.8 lb

## 2018-02-05 DIAGNOSIS — I442 Atrioventricular block, complete: Secondary | ICD-10-CM | POA: Diagnosis not present

## 2018-02-05 DIAGNOSIS — Z952 Presence of prosthetic heart valve: Secondary | ICD-10-CM

## 2018-02-05 DIAGNOSIS — I10 Essential (primary) hypertension: Secondary | ICD-10-CM

## 2018-02-05 LAB — CUP PACEART INCLINIC DEVICE CHECK
Implantable Lead Implant Date: 20181014
Implantable Pulse Generator Implant Date: 20181014
MDC IDC LEAD IMPLANT DT: 20181014
MDC IDC LEAD LOCATION: 753859
MDC IDC LEAD LOCATION: 753860
MDC IDC SESS DTM: 20200130083541
Pulse Gen Model: 2272
Pulse Gen Serial Number: 8951116

## 2018-02-05 MED ORDER — PRAVASTATIN SODIUM 80 MG PO TABS: 80.0000 mg | ORAL_TABLET | Freq: Every evening | ORAL | 0 refills | Status: DC

## 2018-02-05 NOTE — Patient Instructions (Signed)
Medication Instructions:  NONE If you need a refill on your cardiac medications before your next appointment, please call your pharmacy.   Lab work: NONE If you have labs (blood work) drawn today and your tests are completely normal, you will receive your results only by: Marland Kitchen MyChart Message (if you have MyChart) OR . A paper copy in the mail If you have any lab test that is abnormal or we need to change your treatment, we will call you to review the results.  Testing/Procedures: NONE  Follow-Up: At [facility], you and your health needs are our priority.  As part of our continuing mission to provide you with exceptional heart care, we have created designated Provider Care Teams.  These Care Teams include your primary Cardiologist (physician) and Advanced Practice Providers (APPs -  Physician Assistants and Nurse Practitioners) who all work together to provide you with the care you need, when you need it. You will need a follow up appointment in 1 years.  Please call our office 2 months in advance to schedule this appointment.  You may see Dr Curt Bears or one of the following Advanced Practice Providers on your designated Care Team:   Chanetta Marshall, NP . Tommye Standard, PA-C  Any Other Special Instructions Will Be Listed Below (If Applicable). Remote monitoring is used to monitor your Pacemaker  from home. This monitoring reduces the number of office visits required to check your device to one time per year. It allows [place] to keep an eye on the functioning of your device to ensure it is working properly. You are scheduled for a device check from home on 04/22/18. You may send your transmission at any time that day. If you have a wireless device, the transmission will be sent automatically. After your physician reviews your transmission, you will receive a postcard with your next transmission date.

## 2018-04-07 ENCOUNTER — Telehealth: Payer: Self-pay | Admitting: Emergency Medicine

## 2018-04-07 NOTE — Telephone Encounter (Signed)
YOUR CARDIOLOGY TEAM HAS ARRANGED FOR AN E-VISIT FOR YOUR APPOINTMENT - PLEASE REVIEW IMPORTANT INFORMATION BELOW SEVERAL DAYS PRIOR TO YOUR APPOINTMENT  Due to the recent COVID-19 pandemic, we are transitioning in-person office visits to tele-medicine visits in an effort to decrease unnecessary exposure to our patients and staff. Medicare and most insurances are covering these visits without a copay needed. We also encourage you to sign up for MyChart if you have not already done so. You will need a smartphone if possible. For patients that do not have this, we can still complete the visit using a regular telephone but do prefer a smartphone to enable video when possible. You may have a close family member that lives with you that can help. If possible, we also ask that you have a blood pressure cuff and scale at home to measure your blood pressure, heart rate and weight prior to your scheduled appointment. Patients with clinical needs that need an in-person evaluation and testing will still be able to come to the office if absolutely necessary. If you have any questions, feel free to call our office.    IF YOU HAVE A SMARTPHONE, PLEASE DOWNLOAD THE WEBEX APP TO YOUR SMARTPHONE  - If Apple, go to App Store and type in WebEx in the search bar. Download [employer] Webex Meetings, the blue/green circle. The app is free but as with any other app download, your phone may require you to verify saved payment information or Apple password. You do NOT have to create a WebEx account.  - If Android, go to Google Play Store and type in WebEx in the search bar. Download [employer] Webex Meetings, the blue/green circle. The app is free but as with any other app download, your phone may require you to verify saved payment information or Android password. You do NOT have to create a WebEx account.  It is very helpful to have this downloaded before your visit.    2-3 DAYS BEFORE YOUR APPOINTMENT  You will receive a  telephone call from one of our [facility] team members - your caller ID may say "Unknown caller." If this is a video visit, we will confirm that you have been able to download the WebEx app. We will remind you check your blood pressure, heart rate and weight prior to your scheduled appointment. If you have an Apple Watch or Kardia, please upload any pertinent ECG strips the day before or morning of your appointment to MyChart. Our staff will also make sure you have reviewed the consent and agree to move forward with your scheduled tele-health visit.     THE DAY OF YOUR APPOINTMENT  Approximately 15 minutes prior to your scheduled appointment, you will receive a telephone call from one of HeartCare team - your caller ID may say "Unknown caller."  Our staff will confirm medications, vital signs for the day and any symptoms you may be experiencing. Please have this information available prior to the time of visit start. It may also be helpful for you to have a pad of paper and pen handy for any instructions given during your visit. They will also walk you through joining the WebEx smartphone meeting if this is a video visit.    CONSENT FOR TELE-HEALTH VISIT - PLEASE RVIEW  I hereby voluntarily request, consent and authorize [facility] and its employed or contracted physicians, physician assistants, nurse practitioners or other licensed health care professionals (the Practitioner), to provide me with telemedicine health care services (the "Services") as   deemed necessary by the treating Practitioner. I acknowledge and consent to receive the Services by the Practitioner via telemedicine. I understand that the telemedicine visit will involve communicating with the Practitioner through live audiovisual communication technology and the disclosure of certain medical information by electronic transmission. I acknowledge that I have been given the opportunity to request an in-person assessment or other available  alternative prior to the telemedicine visit and am voluntarily participating in the telemedicine visit.  I understand that I have the right to withhold or withdraw my consent to the use of telemedicine in the course of my care at any time, without affecting my right to future care or treatment, and that the Practitioner or I may terminate the telemedicine visit at any time. I understand that I have the right to inspect all information obtained and/or recorded in the course of the telemedicine visit and may receive copies of available information for a reasonable fee.  I understand that some of the potential risks of receiving the Services via telemedicine include:  Marland Kitchen Delay or interruption in medical evaluation due to technological equipment failure or disruption; . Information transmitted may not be sufficient (e.g. poor resolution of images) to allow for appropriate medical decision making by the Practitioner; and/or  . In rare instances, security protocols could fail, causing a breach of personal health information.  Furthermore, I acknowledge that it is my responsibility to provide information about my medical history, conditions and care that is complete and accurate to the best of my ability. I acknowledge that Practitioner's advice, recommendations, and/or decision may be based on factors not within their control, such as incomplete or inaccurate data provided by me or distortions of diagnostic images or specimens that may result from electronic transmissions. I understand that the practice of medicine is not an exact science and that Practitioner makes no warranties or guarantees regarding treatment outcomes. I acknowledge that I will receive a copy of this consent concurrently upon execution via email to the email address I last provided but may also request a printed copy by calling the office of [place].    I understand that my insurance will be billed for this visit.   I have read or had  this consent read to me. . I understand the contents of this consent, which adequately explains the benefits and risks of the Services being provided via telemedicine.  . I have been provided ample opportunity to ask questions regarding this consent and the Services and have had my questions answered to my satisfaction. . I give my informed consent for the services to be provided through the use of telemedicine in my medical care  By participating in this telemedicine visit I agree to the above.   Patient agrees to consent and has been scheduled for televisit on 04-10-2018

## 2018-04-08 DIAGNOSIS — Z952 Presence of prosthetic heart valve: Secondary | ICD-10-CM | POA: Diagnosis not present

## 2018-04-08 DIAGNOSIS — E785 Hyperlipidemia, unspecified: Secondary | ICD-10-CM | POA: Diagnosis not present

## 2018-04-08 DIAGNOSIS — Z950 Presence of cardiac pacemaker: Secondary | ICD-10-CM | POA: Diagnosis not present

## 2018-04-08 DIAGNOSIS — I129 Hypertensive chronic kidney disease with stage 1 through stage 4 chronic kidney disease, or unspecified chronic kidney disease: Secondary | ICD-10-CM | POA: Diagnosis not present

## 2018-04-10 ENCOUNTER — Telehealth (INDEPENDENT_AMBULATORY_CARE_PROVIDER_SITE_OTHER): Payer: Medicare Other | Admitting: Cardiology

## 2018-04-10 ENCOUNTER — Encounter: Payer: Self-pay | Admitting: Cardiology

## 2018-04-10 ENCOUNTER — Other Ambulatory Visit: Payer: Self-pay

## 2018-04-10 VITALS — BP 113/65 | HR 64 | Ht 63.0 in | Wt 165.0 lb

## 2018-04-10 DIAGNOSIS — Z952 Presence of prosthetic heart valve: Secondary | ICD-10-CM

## 2018-04-10 DIAGNOSIS — Z950 Presence of cardiac pacemaker: Secondary | ICD-10-CM

## 2018-04-10 DIAGNOSIS — I10 Essential (primary) hypertension: Secondary | ICD-10-CM

## 2018-04-10 DIAGNOSIS — I442 Atrioventricular block, complete: Secondary | ICD-10-CM

## 2018-04-10 NOTE — Progress Notes (Signed)
Virtual Visit via Telephone Note    Evaluation Performed:  Follow-up visit  This visit type was conducted due to national recommendations for restrictions regarding the COVID-19 Pandemic (e.g. social distancing).  This format is felt to be most appropriate for this patient at this time.  All issues noted in this document were discussed and addressed.  No physical exam was performed (except for noted visual exam findings with Video Visits).  Please refer to the patient's chart (MyChart message for video visits and phone note for telephone visits) for the patient's consent to telehealth for [facility]  Date:  04/10/2018  ID: Robin Sutton, DOB December 17, 1935, MRN [id number]   Patient Location:  [address] [place] [address]   Provider location:   [place] Office  PCP:  Maylon Cos, NP  Cardiologist:  Jenne Campus, MD     Chief Complaint: I am feeling lonely  History of Present Illness:    Robin Sutton is a 83 y.o. female  who presents via audio/video conferencing for a telehealth visit today.  Past medical history significant for critical aortic stenosis that required TAVR implantation that happened in fall 2018.  That was followed by dual-chamber pacemaker implantation secondary to complete heart block.  She also has past medical history significant for essential hypertension as well as remote history of CVA.  We do tele-visits today because of coronavirus situation.  She is doing well otherwise she denies having any chest pain tightness squeezing pressure burning chest no dizziness no passing out.  She does have some shortness of breath but as usual.  [place]  She does have family in [place] however they do not come and visit her.  She goes about once a week to the store to buy some supply otherwise she is staying at home.  She described the fact that she is very lonely we were talking about her church support  group which staying in touch with her she also call and talk to her family on the regular basis but overall she is alone.   The patient does not have symptoms concerning for COVID-19 infection (fever, chills, cough, or new SHORTNESS OF BREATH).    Prior CV studies:   The following studies were reviewed today:  Echocardiogram reviewed from fall of last year     Past Medical History:  Diagnosis Date   History of CVA (cerebrovascular accident)    HLD (hyperlipidemia)    Hypertension    S/P placement of cardiac pacemaker    a. developed CHB s/p TAVR and got a St. Jude Medical Assurity MRI compatible dual chamber [id number] (serial number  [id number])   S/P TAVR (transcatheter aortic valve replacement)    a. 10/2016: 23 mm Medtronic Evolut Pro transcatheter heart valve placed via percutaneous right transfemoral approach     Past Surgical History:  Procedure Laterality Date   BREAST BIOPSY     PACEMAKER IMPLANT N/A 10/20/2016   St Jude Medical Assurity MRI conditional  dual-chamber pacemaker for symptomatic complete heart block by Dr Rayann Heman   RIGHT/LEFT HEART CATH AND CORONARY ANGIOGRAPHY N/A 09/11/2016   Procedure: RIGHT/LEFT HEART CATH AND CORONARY ANGIOGRAPHY;  Surgeon: Sherren Mocha, MD;  Location: [place] [facility];  Service: Cardiovascular;  Laterality: N/A;   TEE WITHOUT CARDIOVERSION N/A 10/17/2016   Procedure: TRANSESOPHAGEAL ECHOCARDIOGRAM (TEE);  Surgeon: Sherren Mocha, MD;  Location: [place];  Service: Open Heart Surgery;  Laterality: N/A;   TONSILLECTOMY  TRANSCATHETER AORTIC VALVE REPLACEMENT, TRANSFEMORAL N/A 10/17/2016   Procedure: TRANSCATHETER AORTIC VALVE REPLACEMENT, TRANSFEMORAL;  Surgeon: Sherren Mocha, MD;  Location: [place];  Service: Open Heart Surgery;  Laterality: N/A;     Current Meds  Medication Sig   amoxicillin (AMOXIL) 500 MG capsule Take 500 mg by mouth 3 (three) times daily. Take 4 capsules by mouth once as needed for dental work    aspirin EC 81 MG EC tablet Take 1 tablet (81 mg total) by mouth daily.   Coenzyme Q10 200 MG capsule Take 200 mg by mouth daily.   lisinopril (PRINIVIL,ZESTRIL) 10 MG tablet Take 10 mg by mouth every evening.    nitroGLYCERIN (NITROSTAT) 0.4 MG SL tablet Place 1 tablet (0.4 mg total) under the tongue every 5 (five) minutes as needed for chest pain.   omeprazole (PRILOSEC) 40 MG capsule Take 40 mg by mouth daily as needed.   polyethylene glycol powder (GLYCOLAX/MIRALAX) powder Take 17 g by mouth daily as needed (consitipation).    pravastatin (PRAVACHOL) 80 MG tablet Take 1 tablet (80 mg total) by mouth every evening.   Vitamin D, Ergocalciferol, (DRISDOL) 50000 units CAPS capsule Take 50,000 Units by mouth every Monday.       Family History: The patient's family history includes Diabetes in her maternal grandfather; Heart disease in her brother, maternal grandfather, and mother; Hypertension in her brother and mother; Kidney failure in her father; Stroke in her mother.   ROS:   Please see the history of present illness.     All other systems reviewed and are negative.   Labs/Other Tests and Data Reviewed:     Recent Labs: No results found for requested labs within last 8760 hours.  Recent Lipid Panel No results found for: CHOL, TRIG, HDL, CHOLHDL, VLDL, LDLCALC, LDLDIRECT    Exam:    Vital Signs:  BP 113/65    Pulse 64    Ht 5\' 3"  (1.6 m)    Wt 165 lb (74.8 kg)    BMI 29.23 kg/m     Wt Readings from Last 3 Encounters:  04/10/18 165 lb (74.8 kg)  02/05/18 171 lb 12.8 oz (77.9 kg)  10/20/17 170 lb 12.8 oz (77.5 kg)     Well nourished, well developed female in no acute distress. Is alert awake oriented x3 and she is pleased to be able to talk to me on the phone  Diagnosis for this visit:   1. S/P TAVR (transcatheter aortic valve replacement)   2. CHB (complete heart block) (HCC)   3. Essential hypertension   4. Pacemaker      ASSESSMENT & PLAN:    1.   Status post TAVR doing well from that point review asymptomatic we will schedule her to have echocardiogram which will happen in the fall of this year. 2.  Complete heart block.  She does have stJude pacemaker.  Last interrogation has been done in January if normal function.  The battery status. 3.  Essential hypertension blood pressure well controlled continue present management. 4.  Pacemaker present followed by our pacemaker clinic normal function.  COVID-19 Education: The signs and symptoms of COVID-19 were discussed with the patient and how to seek care for testing (follow up with PCP or arrange E-visit).  The importance of social distancing was discussed today.  Patient Risk:   After full review of this patients clinical status, I feel that they are at least moderate risk at this time.  Time:   Today, I have  spent 15 minutes with the patient with telehealth technology discussing pt health issues. Visit was finished at 9:35 AM.    Medication Adjustments/Labs and Tests Ordered: Current medicines are reviewed at length with the patient today.  Concerns regarding medicines are outlined above.  No orders of the defined types were placed in this encounter.  Medication changes: No orders of the defined types were placed in this encounter.    Disposition: Follow-up in 3 months  Signed, Park Liter, MD, [facility] 04/10/2018 9:32 AM    [place]

## 2018-04-10 NOTE — Patient Instructions (Signed)
Medication Instructions:  Your physician recommends that you continue on your current medications as directed. Please refer to the Current Medication list given to you today.  If you need a refill on your cardiac medications before your next appointment, please call your pharmacy.   Lab work: None.  If you have labs (blood work) drawn today and your tests are completely normal, you will receive your results only by: . MyChart Message (if you have MyChart) OR . A paper copy in the mail If you have any lab test that is abnormal or we need to change your treatment, we will call you to review the results.  Testing/Procedures: None.   Follow-Up: At [facility], you and your health needs are our priority.  As part of our continuing mission to provide you with exceptional heart care, we have created designated Provider Care Teams.  These Care Teams include your primary Cardiologist (physician) and Advanced Practice Providers (APPs -  Physician Assistants and Nurse Practitioners) who all work together to provide you with the care you need, when you need it. You will need a follow up appointment in 3 months.  Please call our office 2 months in advance to schedule this appointment.  You may see Robert Krasowski, MD or another member of our [facility] Provider Team in [place]: Brian Munley, MD . Rajan Revankar, MD  Any Other Special Instructions Will Be Listed Below (If Applicable).    

## 2018-04-13 ENCOUNTER — Ambulatory Visit: Payer: Medicare Other | Admitting: Cardiology

## 2018-04-22 ENCOUNTER — Ambulatory Visit (INDEPENDENT_AMBULATORY_CARE_PROVIDER_SITE_OTHER): Payer: Medicare Other | Admitting: *Deleted

## 2018-04-22 ENCOUNTER — Other Ambulatory Visit: Payer: Self-pay

## 2018-04-22 ENCOUNTER — Telehealth: Payer: Self-pay | Admitting: Emergency Medicine

## 2018-04-22 DIAGNOSIS — I6930 Unspecified sequelae of cerebral infarction: Secondary | ICD-10-CM

## 2018-04-22 DIAGNOSIS — I442 Atrioventricular block, complete: Secondary | ICD-10-CM

## 2018-04-22 LAB — CUP PACEART REMOTE DEVICE CHECK
Battery Remaining Longevity: 109 mo
Battery Remaining Percentage: 95.5 %
Battery Voltage: 2.99 V
Brady Statistic AP VP Percent: 25 %
Brady Statistic AP VS Percent: 1 %
Brady Statistic AS VP Percent: 75 %
Brady Statistic AS VS Percent: 1 %
Brady Statistic RA Percent Paced: 24 %
Brady Statistic RV Percent Paced: 99 %
Date Time Interrogation Session: 20200415070634
Implantable Lead Implant Date: 20181014
Implantable Lead Implant Date: 20181014
Implantable Lead Location: 753859
Implantable Lead Location: 753860
Implantable Pulse Generator Implant Date: 20181014
Lead Channel Impedance Value: 450 Ohm
Lead Channel Impedance Value: 630 Ohm
Lead Channel Pacing Threshold Amplitude: 0.5 V
Lead Channel Pacing Threshold Amplitude: 0.75 V
Lead Channel Pacing Threshold Pulse Width: 0.5 ms
Lead Channel Pacing Threshold Pulse Width: 0.5 ms
Lead Channel Sensing Intrinsic Amplitude: 2.3 mV
Lead Channel Sensing Intrinsic Amplitude: 9.6 mV
Lead Channel Setting Pacing Amplitude: 2 V
Lead Channel Setting Pacing Amplitude: 2.5 V
Lead Channel Setting Pacing Pulse Width: 0.5 ms
Lead Channel Setting Sensing Sensitivity: 4 mV
Pulse Gen Model: 2272
Pulse Gen Serial Number: 8951116

## 2018-04-22 NOTE — Telephone Encounter (Signed)
Called patient to check and see how she was doing she reports that she is doing fine and has no complaints at this time. Patient instructed to call us with any concerns.

## 2018-04-28 ENCOUNTER — Telehealth: Payer: Self-pay | Admitting: Emergency Medicine

## 2018-04-28 NOTE — Telephone Encounter (Signed)
Called to check on patient per Dr. Wendy Poet request she reports she is fine and has no concerns at this time.

## 2018-04-29 NOTE — Progress Notes (Signed)
Remote pacemaker transmission.   

## 2018-05-12 DIAGNOSIS — E785 Hyperlipidemia, unspecified: Secondary | ICD-10-CM | POA: Diagnosis not present

## 2018-05-12 DIAGNOSIS — E559 Vitamin D deficiency, unspecified: Secondary | ICD-10-CM | POA: Diagnosis not present

## 2018-05-14 ENCOUNTER — Other Ambulatory Visit: Payer: Self-pay | Admitting: Nurse Practitioner

## 2018-06-18 DIAGNOSIS — I6523 Occlusion and stenosis of bilateral carotid arteries: Secondary | ICD-10-CM | POA: Diagnosis not present

## 2018-07-13 ENCOUNTER — Encounter: Payer: Self-pay | Admitting: Cardiology

## 2018-07-13 ENCOUNTER — Telehealth (INDEPENDENT_AMBULATORY_CARE_PROVIDER_SITE_OTHER): Payer: Medicare Other | Admitting: Cardiology

## 2018-07-13 ENCOUNTER — Telehealth: Payer: Self-pay | Admitting: *Deleted

## 2018-07-13 ENCOUNTER — Other Ambulatory Visit: Payer: Self-pay

## 2018-07-13 VITALS — BP 102/63 | Wt 163.0 lb

## 2018-07-13 DIAGNOSIS — I10 Essential (primary) hypertension: Secondary | ICD-10-CM

## 2018-07-13 DIAGNOSIS — Z8673 Personal history of transient ischemic attack (TIA), and cerebral infarction without residual deficits: Secondary | ICD-10-CM

## 2018-07-13 DIAGNOSIS — Z952 Presence of prosthetic heart valve: Secondary | ICD-10-CM | POA: Diagnosis not present

## 2018-07-13 DIAGNOSIS — Z950 Presence of cardiac pacemaker: Secondary | ICD-10-CM

## 2018-07-13 NOTE — Telephone Encounter (Signed)
Telephone call to patient. Left message to call back to go over discharge instructions and set up 3 month appointment.

## 2018-07-13 NOTE — Patient Instructions (Addendum)
Medication Instructions:  Your physician recommends that you continue on your current medications as directed. Please refer to the Current Medication list given to you today.  If you need a refill on your cardiac medications before your next appointment, please call your pharmacy.   Lab work: None If you have labs (blood work) drawn today and your tests are completely normal, you will receive your results only by: Marland Kitchen MyChart Message (if you have MyChart) OR . A paper copy in the mail If you have any lab test that is abnormal or we need to change your treatment, we will call you to review the results.  Testing/Procedures: Your physician has requested that you have an echocardiogram. Echocardiography is a painless test that uses sound waves to create images of your heart. It provides your doctor with information about the size and shape of your heart and how well your heart's chambers and valves are working. This procedure takes approximately one hour. There are no restrictions for this procedure.  YOUR APPOINTMENT IS AUG 20,2020 AT 10:15 AM  Follow-Up: At [facility], you and your health needs are our priority.  As part of our continuing mission to provide you with exceptional heart care, we have created designated Provider Care Teams.  These Care Teams include your primary Cardiologist (physician) and Advanced Practice Providers (APPs -  Physician Assistants and Nurse Practitioners) who all work together to provide you with the care you need, when you need it. You will need a follow up appointment in 3 months.   Any Other Special Instructions Will Be Listed Below (If Applicable).   Echocardiogram An echocardiogram is a procedure that uses painless sound waves (ultrasound) to produce an image of the heart. Images from an echocardiogram can provide important information about:  Signs of coronary artery disease (CAD).  Aneurysm detection. An aneurysm is a weak or damaged part of an artery  wall that bulges out from the normal force of blood pumping through the body.  Heart size and shape. Changes in the size or shape of the heart can be associated with certain conditions, including heart failure, aneurysm, and CAD.  Heart muscle function.  Heart valve function.  Signs of a past heart attack.  Fluid buildup around the heart.  Thickening of the heart muscle.  A tumor or infectious growth around the heart valves. Tell a health care provider about:  Any allergies you have.  All medicines you are taking, including vitamins, herbs, eye drops, creams, and over-the-counter medicines.  Any blood disorders you have.  Any surgeries you have had.  Any medical conditions you have.  Whether you are pregnant or may be pregnant. What are the risks? Generally, this is a safe procedure. However, problems may occur, including:  Allergic reaction to dye (contrast) that may be used during the procedure. What happens before the procedure? No specific preparation is needed. You may eat and drink normally. What happens during the procedure?   An IV tube may be inserted into one of your veins.  You may receive contrast through this tube. A contrast is an injection that improves the quality of the pictures from your heart.  A gel will be applied to your chest.  A wand-like tool (transducer) will be moved over your chest. The gel will help to transmit the sound waves from the transducer.  The sound waves will harmlessly bounce off of your heart to allow the heart images to be captured in real-time motion. The images will be recorded  on a computer. The procedure may vary among health care providers and hospitals. What happens after the procedure?  You may return to your normal, everyday life, including diet, activities, and medicines, unless your health care provider tells you not to do that. Summary  An echocardiogram is a procedure that uses painless sound waves (ultrasound)  to produce an image of the heart.  Images from an echocardiogram can provide important information about the size and shape of your heart, heart muscle function, heart valve function, and fluid buildup around your heart.  You do not need to do anything to prepare before this procedure. You may eat and drink normally.  After the echocardiogram is completed, you may return to your normal, everyday life, unless your health care provider tells you not to do that. This information is not intended to replace advice given to you by your health care provider. Make sure you discuss any questions you have with your health care provider. Document Released: 12/22/1999 Document Revised: 04/16/2018 Document Reviewed: 01/27/2016 Elsevier Patient Education  2020 [employer].

## 2018-07-13 NOTE — Progress Notes (Signed)
Virtual Visit via Telephone Note   This visit type was conducted due to national recommendations for restrictions regarding the COVID-19 Pandemic (e.g. social distancing) in an effort to limit this patient's exposure and mitigate transmission in our community.  Due to her co-morbid illnesses, this patient is at least at moderate risk for complications without adequate follow up.  This format is felt to be most appropriate for this patient at this time.  The patient did not have access to video technology/had technical difficulties with video requiring transitioning to audio format only (telephone).  All issues noted in this document were discussed and addressed.  No physical exam could be performed with this format.  Please refer to the patient's chart for her  consent to telehealth for [facility].  Evaluation Performed:  Follow-up visit  This visit type was conducted due to national recommendations for restrictions regarding the COVID-19 Pandemic (e.g. social distancing).  This format is felt to be most appropriate for this patient at this time.  All issues noted in this document were discussed and addressed.  No physical exam was performed (except for noted visual exam findings with Video Visits).  Please refer to the patient's chart (MyChart message for video visits and phone note for telephone visits) for the patient's consent to telehealth for [facility].  Date:  07/13/2018  ID: Robin Sutton, DOB 08-22-1935, MRN [id number]   Patient Location: [address] [place] [address]   Provider location:   [place] Office  PCP:  Maylon Cos, NP  Cardiologist:  Jenne Campus, MD     Chief Complaint: I am doing well for 83 years old woman  History of Present Illness:    Robin Sutton is a 83 y.o. female  who presents via audio/video conferencing for a telehealth visit today.  With status post TAVR done in 2018, as a sequela of it she had a dual-chamber  pacemaker.  Also hypertension remote history of CVA.  She has a virtual visit with me today.  She was unable to establish video link therefore we talk over the phone.  She is doing well denies having any chest pain, tightness, pressure, burning, squeezing in the chest.  Overall she is very happy and satisfied she which she feels.   The patient does not have symptoms concerning for COVID-19 infection (fever, chills, cough, or new SHORTNESS OF BREATH).    Prior CV studies:   The following studies were reviewed today:       Past Medical History:  Diagnosis Date  . History of CVA (cerebrovascular accident)   . HLD (hyperlipidemia)   . Hypertension   . S/P placement of cardiac pacemaker    a. developed CHB s/p TAVR and got a St. Jude Medical Assurity MRI compatible dual chamber [id number] (serial number  [id number])  . S/P TAVR (transcatheter aortic valve replacement)    a. 10/2016: 23 mm Medtronic Evolut Pro transcatheter heart valve placed via percutaneous right transfemoral approach     Past Surgical History:  Procedure Laterality Date  . BREAST BIOPSY    . PACEMAKER IMPLANT N/A 10/20/2016   St Jude Medical Assurity MRI conditional  dual-chamber pacemaker for symptomatic complete heart block by Dr Rayann Heman  . RIGHT/LEFT HEART CATH AND CORONARY ANGIOGRAPHY N/A 09/11/2016   Procedure: RIGHT/LEFT HEART CATH AND CORONARY ANGIOGRAPHY;  Surgeon: Sherren Mocha, MD;  Location: [facility];  Service: Cardiovascular;  Laterality: N/A;  . TEE WITHOUT CARDIOVERSION N/A 10/17/2016   Procedure:  TRANSESOPHAGEAL ECHOCARDIOGRAM (TEE);  Surgeon: Sherren Mocha, MD;  Location: [facility];  Service: Open Heart Surgery;  Laterality: N/A;  . TONSILLECTOMY    . TRANSCATHETER AORTIC VALVE REPLACEMENT, TRANSFEMORAL N/A 10/17/2016   Procedure: TRANSCATHETER AORTIC VALVE REPLACEMENT, TRANSFEMORAL;  Surgeon: Sherren Mocha, MD;  Location: [facility];  Service: Open Heart Surgery;  Laterality: N/A;     Current Meds   Medication Sig  . amoxicillin (AMOXIL) 500 MG capsule Take 500 mg by mouth 3 (three) times daily. Take 4 capsules by mouth once as needed for dental work  . aspirin EC 81 MG EC tablet Take 1 tablet (81 mg total) by mouth daily.  . Coenzyme Q10 200 MG capsule Take 200 mg by mouth daily.  Marland Kitchen lisinopril (PRINIVIL,ZESTRIL) 10 MG tablet Take 10 mg by mouth every evening.   . nitroGLYCERIN (NITROSTAT) 0.4 MG SL tablet Place 1 tablet (0.4 mg total) under the tongue every 5 (five) minutes as needed for chest pain.  Marland Kitchen omeprazole (PRILOSEC) 40 MG capsule Take 40 mg by mouth daily as needed.  . polyethylene glycol powder (GLYCOLAX/MIRALAX) powder Take 17 g by mouth daily as needed (consitipation).   . pravastatin (PRAVACHOL) 80 MG tablet Take 1 tablet (80 mg total) by mouth every evening.  . Vitamin D, Ergocalciferol, (DRISDOL) 50000 units CAPS capsule Take 50,000 Units by mouth every Monday.       Family History: The patient's family history includes Diabetes in her maternal grandfather; Heart disease in her brother, maternal grandfather, and mother; Hypertension in her brother and mother; Kidney failure in her father; Stroke in her mother.   ROS:   Please see the history of present illness.     All other systems reviewed and are negative.   Labs/Other Tests and Data Reviewed:     Recent Labs: No results found for requested labs within last 8760 hours.  Recent Lipid Panel No results found for: CHOL, TRIG, HDL, CHOLHDL, VLDL, LDLCALC, LDLDIRECT    Exam:    Vital Signs:  BP 102/63   Wt 163 lb (73.9 kg)   BMI 28.87 kg/m     Wt Readings from Last 3 Encounters:  07/13/18 163 lb (73.9 kg)  04/10/18 165 lb (74.8 kg)  02/05/18 171 lb 12.8 oz (77.9 kg)     Well nourished, well developed in no acute distress. Alert awake greater than 3 not in distress quite cheerful and happy to be able to talk to me over the phone  Diagnosis for this visit:   1. S/P TAVR (transcatheter aortic valve  replacement)   2. S/P placement of cardiac pacemaker   3. History of CVA (cerebrovascular accident)   4. Essential hypertension      ASSESSMENT & PLAN:    1.  Status post TAVR does not have any signs and symptoms of deterioration of the valve.  She will be scheduled to have an echocardiogram in October. 2.  Pacemaker present.  Interrogation reviewed 9 years left in the device.  She got multiple mode switches however does have very short up to 2 minutes. 3.  History of CVA stable no new issues 4.  Essential hypertension blood pressure well controlled  COVID-19 Education: The signs and symptoms of COVID-19 were discussed with the patient and how to seek care for testing (follow up with PCP or arrange E-visit).  The importance of social distancing was discussed today.  Patient Risk:   After full review of this patients clinical status, I feel that they are at  least moderate risk at this time.  Time:   Today, I have spent 16 minutes with the patient with telehealth technology discussing pt health issues.  I spent 5 minutes reviewing her chart before the visit.  Visit was finished at 9:06 AM.    Medication Adjustments/Labs and Tests Ordered: Current medicines are reviewed at length with the patient today.  Concerns regarding medicines are outlined above.  No orders of the defined types were placed in this encounter.  Medication changes: No orders of the defined types were placed in this encounter.    Disposition: Follow-up 6 months echocardiogram will be done in October.  Signed, Park Liter, MD, [facility] 07/13/2018 9:28 AM    [place]

## 2018-07-14 NOTE — Telephone Encounter (Signed)
Patient returned call. Appointment set up for echo and follow up.

## 2018-07-16 DIAGNOSIS — Z139 Encounter for screening, unspecified: Secondary | ICD-10-CM | POA: Diagnosis not present

## 2018-07-16 DIAGNOSIS — E785 Hyperlipidemia, unspecified: Secondary | ICD-10-CM | POA: Diagnosis not present

## 2018-07-16 DIAGNOSIS — Z0000 Encounter for general adult medical examination without abnormal findings: Secondary | ICD-10-CM | POA: Diagnosis not present

## 2018-07-16 DIAGNOSIS — Z9181 History of falling: Secondary | ICD-10-CM | POA: Diagnosis not present

## 2018-07-16 DIAGNOSIS — Z1331 Encounter for screening for depression: Secondary | ICD-10-CM | POA: Diagnosis not present

## 2018-07-16 DIAGNOSIS — N959 Unspecified menopausal and perimenopausal disorder: Secondary | ICD-10-CM | POA: Diagnosis not present

## 2018-07-16 DIAGNOSIS — Z1231 Encounter for screening mammogram for malignant neoplasm of breast: Secondary | ICD-10-CM | POA: Diagnosis not present

## 2018-07-22 ENCOUNTER — Ambulatory Visit (INDEPENDENT_AMBULATORY_CARE_PROVIDER_SITE_OTHER): Payer: Medicare Other | Admitting: *Deleted

## 2018-07-22 DIAGNOSIS — I442 Atrioventricular block, complete: Secondary | ICD-10-CM

## 2018-07-22 LAB — CUP PACEART REMOTE DEVICE CHECK
Date Time Interrogation Session: 20200715135300
Implantable Lead Implant Date: 20181014
Implantable Lead Implant Date: 20181014
Implantable Lead Location: 753859
Implantable Lead Location: 753860
Implantable Pulse Generator Implant Date: 20181014
Pulse Gen Model: 2272
Pulse Gen Serial Number: 8951116

## 2018-07-28 DIAGNOSIS — Z1231 Encounter for screening mammogram for malignant neoplasm of breast: Secondary | ICD-10-CM | POA: Diagnosis not present

## 2018-08-01 ENCOUNTER — Encounter: Payer: Self-pay | Admitting: Cardiology

## 2018-08-01 NOTE — Progress Notes (Signed)
Remote pacemaker transmission.   

## 2018-08-27 ENCOUNTER — Other Ambulatory Visit: Payer: Self-pay

## 2018-08-27 ENCOUNTER — Ambulatory Visit (INDEPENDENT_AMBULATORY_CARE_PROVIDER_SITE_OTHER): Payer: Medicare Other

## 2018-08-27 DIAGNOSIS — Z952 Presence of prosthetic heart valve: Secondary | ICD-10-CM | POA: Diagnosis not present

## 2018-08-27 DIAGNOSIS — I10 Essential (primary) hypertension: Secondary | ICD-10-CM | POA: Diagnosis not present

## 2018-08-27 NOTE — Progress Notes (Signed)
Complete echocardiogram has been performed.  Jimmy Lavon Bothwell RDCS, RVT 

## 2018-08-31 ENCOUNTER — Telehealth: Payer: Self-pay

## 2018-08-31 NOTE — Telephone Encounter (Signed)
Information relayed, no further questions.  

## 2018-08-31 NOTE — Telephone Encounter (Signed)
-----   Message from Park Liter, MD sent at 08/28/2018 12:42 PM EDT ----- Echo looks good, ef normal  AV looks good

## 2018-09-29 DIAGNOSIS — H9193 Unspecified hearing loss, bilateral: Secondary | ICD-10-CM | POA: Diagnosis not present

## 2018-09-30 DIAGNOSIS — Z950 Presence of cardiac pacemaker: Secondary | ICD-10-CM | POA: Insufficient documentation

## 2018-09-30 DIAGNOSIS — Z952 Presence of prosthetic heart valve: Secondary | ICD-10-CM | POA: Diagnosis not present

## 2018-09-30 DIAGNOSIS — Z8673 Personal history of transient ischemic attack (TIA), and cerebral infarction without residual deficits: Secondary | ICD-10-CM | POA: Diagnosis not present

## 2018-09-30 DIAGNOSIS — I442 Atrioventricular block, complete: Secondary | ICD-10-CM

## 2018-09-30 DIAGNOSIS — E782 Mixed hyperlipidemia: Secondary | ICD-10-CM | POA: Diagnosis not present

## 2018-09-30 DIAGNOSIS — Z79899 Other long term (current) drug therapy: Secondary | ICD-10-CM | POA: Diagnosis not present

## 2018-09-30 DIAGNOSIS — I119 Hypertensive heart disease without heart failure: Secondary | ICD-10-CM

## 2018-09-30 DIAGNOSIS — G3184 Mild cognitive impairment, so stated: Secondary | ICD-10-CM | POA: Diagnosis not present

## 2018-09-30 HISTORY — DX: Hypertensive heart disease without heart failure: I11.9

## 2018-09-30 HISTORY — DX: Atrioventricular block, complete: I44.2

## 2018-10-01 DIAGNOSIS — Z23 Encounter for immunization: Secondary | ICD-10-CM | POA: Diagnosis not present

## 2018-10-02 DIAGNOSIS — I1310 Hypertensive heart and chronic kidney disease without heart failure, with stage 1 through stage 4 chronic kidney disease, or unspecified chronic kidney disease: Secondary | ICD-10-CM | POA: Insufficient documentation

## 2018-10-02 DIAGNOSIS — N183 Chronic kidney disease, stage 3 unspecified: Secondary | ICD-10-CM | POA: Insufficient documentation

## 2018-10-02 HISTORY — DX: Hypertensive heart and chronic kidney disease without heart failure, with stage 1 through stage 4 chronic kidney disease, or unspecified chronic kidney disease: I13.10

## 2018-10-02 HISTORY — DX: Chronic kidney disease, stage 3 unspecified: N18.30

## 2018-10-21 ENCOUNTER — Ambulatory Visit (INDEPENDENT_AMBULATORY_CARE_PROVIDER_SITE_OTHER): Payer: Medicare Other | Admitting: *Deleted

## 2018-10-21 DIAGNOSIS — I442 Atrioventricular block, complete: Secondary | ICD-10-CM

## 2018-10-21 LAB — CUP PACEART REMOTE DEVICE CHECK
Battery Remaining Longevity: 107 mo
Battery Remaining Percentage: 95.5 %
Battery Voltage: 2.99 V
Brady Statistic AP VP Percent: 27 %
Brady Statistic AP VS Percent: 1 %
Brady Statistic AS VP Percent: 73 %
Brady Statistic AS VS Percent: 1 %
Brady Statistic RA Percent Paced: 25 %
Brady Statistic RV Percent Paced: 99 %
Date Time Interrogation Session: 20201014060014
Implantable Lead Implant Date: 20181014
Implantable Lead Implant Date: 20181014
Implantable Lead Location: 753859
Implantable Lead Location: 753860
Implantable Pulse Generator Implant Date: 20181014
Lead Channel Impedance Value: 430 Ohm
Lead Channel Impedance Value: 580 Ohm
Lead Channel Pacing Threshold Amplitude: 0.5 V
Lead Channel Pacing Threshold Amplitude: 0.75 V
Lead Channel Pacing Threshold Pulse Width: 0.5 ms
Lead Channel Pacing Threshold Pulse Width: 0.5 ms
Lead Channel Sensing Intrinsic Amplitude: 3.9 mV
Lead Channel Sensing Intrinsic Amplitude: 9.6 mV
Lead Channel Setting Pacing Amplitude: 2 V
Lead Channel Setting Pacing Amplitude: 2.5 V
Lead Channel Setting Pacing Pulse Width: 0.5 ms
Lead Channel Setting Sensing Sensitivity: 4 mV
Pulse Gen Model: 2272
Pulse Gen Serial Number: 8951116

## 2018-10-27 ENCOUNTER — Other Ambulatory Visit: Payer: Self-pay

## 2018-10-27 ENCOUNTER — Ambulatory Visit (INDEPENDENT_AMBULATORY_CARE_PROVIDER_SITE_OTHER): Payer: Medicare Other | Admitting: Cardiology

## 2018-10-27 VITALS — BP 126/68 | HR 78 | Ht 63.0 in | Wt 170.0 lb

## 2018-10-27 DIAGNOSIS — E782 Mixed hyperlipidemia: Secondary | ICD-10-CM | POA: Diagnosis not present

## 2018-10-27 DIAGNOSIS — Z952 Presence of prosthetic heart valve: Secondary | ICD-10-CM

## 2018-10-27 DIAGNOSIS — Z950 Presence of cardiac pacemaker: Secondary | ICD-10-CM | POA: Diagnosis not present

## 2018-10-27 MED ORDER — NITROGLYCERIN 0.4 MG SL SUBL: 0.4000 mg | SUBLINGUAL_TABLET | SUBLINGUAL | 3 refills | Status: DC | PRN

## 2018-10-27 NOTE — Progress Notes (Signed)
Cardiology Office Note:    Date:  10/27/2018   ID:  Robin Sutton, DOB 1935-03-19, MRN [id number]  PCP:  Maylon Cos, NP  Cardiologist:  Jenne Campus, MD    Referring MD: Maylon Cos, NP   Chief Complaint  Patient presents with  . Follow-up  Doing well  History of Present Illness:    Robin Sutton is a 83 y.o. female with history of TAVR done 2 years ago, Environmental health practitioner pacemaker, implanted for complete heart block, essential hypertension, history of CVA, dyslipidemia comes today to my office for follow-up will doing well history is cheerful she comes today with her daughter.  She lives alone and manage quite well however reports having more more difficulty with memory.  For example she on, [place] and did not know how to open the tank to put gas in.  Past Medical History:  Diagnosis Date  . History of CVA (cerebrovascular accident)   . HLD (hyperlipidemia)   . Hypertension   . S/P placement of cardiac pacemaker    a. developed CHB s/p TAVR and got a St. Jude Medical Assurity MRI compatible dual chamber [id number] (serial number  [id number])  . S/P TAVR (transcatheter aortic valve replacement)    a. 10/2016: 23 mm Medtronic Evolut Pro transcatheter heart valve placed via percutaneous right transfemoral approach     Past Surgical History:  Procedure Laterality Date  . BREAST BIOPSY    . PACEMAKER IMPLANT N/A 10/20/2016   St Jude Medical Assurity MRI conditional  dual-chamber pacemaker for symptomatic complete heart block by Dr Rayann Heman  . RIGHT/LEFT HEART CATH AND CORONARY ANGIOGRAPHY N/A 09/11/2016   Procedure: RIGHT/LEFT HEART CATH AND CORONARY ANGIOGRAPHY;  Surgeon: Sherren Mocha, MD;  Location: [facility];  Service: Cardiovascular;  Laterality: N/A;  . TEE WITHOUT CARDIOVERSION N/A 10/17/2016   Procedure: TRANSESOPHAGEAL ECHOCARDIOGRAM (TEE);  Surgeon: Sherren Mocha, MD;  Location: [facility];  Service: Open Heart Surgery;  Laterality: N/A;  .  TONSILLECTOMY    . TRANSCATHETER AORTIC VALVE REPLACEMENT, TRANSFEMORAL N/A 10/17/2016   Procedure: TRANSCATHETER AORTIC VALVE REPLACEMENT, TRANSFEMORAL;  Surgeon: Sherren Mocha, MD;  Location: [place];  Service: Open Heart Surgery;  Laterality: N/A;    Current Medications: Current Meds  Medication Sig  . amoxicillin (AMOXIL) 500 MG capsule Take 500 mg by mouth 3 (three) times daily. Take 4 capsules by mouth once as needed for dental work  . aspirin EC 81 MG EC tablet Take 1 tablet (81 mg total) by mouth daily.  . Coenzyme Q10 200 MG capsule Take 200 mg by mouth daily.  Marland Kitchen lisinopril (PRINIVIL,ZESTRIL) 10 MG tablet Take 10 mg by mouth every evening.   . nitroGLYCERIN (NITROSTAT) 0.4 MG SL tablet Place 1 tablet (0.4 mg total) under the tongue every 5 (five) minutes as needed for chest pain.  Marland Kitchen omeprazole (PRILOSEC) 40 MG capsule Take 40 mg by mouth daily as needed.  . polyethylene glycol powder (GLYCOLAX/MIRALAX) powder Take 17 g by mouth daily as needed (consitipation).   . pravastatin (PRAVACHOL) 80 MG tablet Take 1 tablet (80 mg total) by mouth every evening.  . Vitamin D, Ergocalciferol, (DRISDOL) 50000 units CAPS capsule Take 50,000 Units by mouth every Monday.   . [DISCONTINUED] nitroGLYCERIN (NITROSTAT) 0.4 MG SL tablet Place 1 tablet (0.4 mg total) under the tongue every 5 (five) minutes as needed for chest pain.     Allergies:   Adhesive [tape]   Social History   Socioeconomic History  .  Marital status: Widowed    Spouse name: Not on file  . Number of children: Not on file  . Years of education: Not on file  . Highest education level: Not on file  Occupational History  . Not on file  Social Needs  . Financial resource strain: Not on file  . Food insecurity    Worry: Not on file    Inability: Not on file  . Transportation needs    Medical: Not on file    Non-medical: Not on file  Tobacco Use  . Smoking status: Never Smoker  . Smokeless tobacco: Never Used  Substance  and Sexual Activity  . Alcohol use: No  . Drug use: No  . Sexual activity: Not on file  Lifestyle  . Physical activity    Days per week: Not on file    Minutes per session: Not on file  . Stress: Not on file  Relationships  . Social Herbalist on phone: Not on file    Gets together: Not on file    Attends religious service: Not on file    Active member of club or organization: Not on file    Attends meetings of clubs or organizations: Not on file    Relationship status: Not on file  Other Topics Concern  . Not on file  Social History Narrative  . Not on file     Family History: The patient's family history includes Diabetes in her maternal grandfather; Heart disease in her brother, maternal grandfather, and mother; Hypertension in her brother and mother; Kidney failure in her father; Stroke in her mother. ROS:   Please see the history of present illness.    All 14 point review of systems negative except as described per history of present illness  EKGs/Labs/Other Studies Reviewed:      Recent Labs: No results found for requested labs within last 8760 hours.  Recent Lipid Panel No results found for: CHOL, TRIG, HDL, CHOLHDL, VLDL, LDLCALC, LDLDIRECT  Physical Exam:    VS:  BP 126/68   Pulse 78   Ht 5\' 3"  (1.6 m)   Wt 170 lb (77.1 kg)   SpO2 97%   BMI 30.11 kg/m     Wt Readings from Last 3 Encounters:  10/27/18 170 lb (77.1 kg)  07/13/18 163 lb (73.9 kg)  04/10/18 165 lb (74.8 kg)     GEN:  Well nourished, well developed in no acute distress HEENT: Normal NECK: No JVD; No carotid bruits LYMPHATICS: No lymphadenopathy CARDIAC: RRR, no murmurs, no rubs, no gallops RESPIRATORY:  Clear to auscultation without rales, wheezing or rhonchi  ABDOMEN: Soft, non-tender, non-distended MUSCULOSKELETAL:  No edema; No deformity  SKIN: Warm and dry LOWER EXTREMITIES: no swelling NEUROLOGIC:  Alert and oriented x 3 PSYCHIATRIC:  Normal affect   ASSESSMENT:     1. S/P TAVR (transcatheter aortic valve replacement)   2. S/P placement of cardiac pacemaker   3. Mixed hyperlipidemia    PLAN:    In order of problems listed above:  1. Status post TAVR doing well last echocardiogram done in August showing normal valve function things are looking good 2. Pacemaker present follow-up by our EP team stable 3. Mixed dyslipidemia her LDL in May was 10 however because of her advanced age as well as the fact he did still have coronary disease I will simply monitor it.   Medication Adjustments/Labs and Tests Ordered: Current medicines are reviewed at length with the patient today.  Concerns regarding medicines are outlined above.  No orders of the defined types were placed in this encounter.  Medication changes:  Meds ordered this encounter  Medications  . nitroGLYCERIN (NITROSTAT) 0.4 MG SL tablet    Sig: Place 1 tablet (0.4 mg total) under the tongue every 5 (five) minutes as needed for chest pain.    Dispense:  25 tablet    Refill:  3    Signed, Park Liter, MD, [facility] 10/27/2018 2:27 PM    [facility]

## 2018-10-27 NOTE — Patient Instructions (Signed)

## 2018-11-03 NOTE — Progress Notes (Signed)
Remote pacemaker transmission.   

## 2018-12-04 IMAGING — CR DG CHEST 2V
2 series · 2 of 2 positions shown · non-contrast
Comparison: None.

CLINICAL DATA: Preop for coronary catheterization. Shortness of
breath on exertion.

EXAM:
CHEST  2 VIEW

[w chest pa]
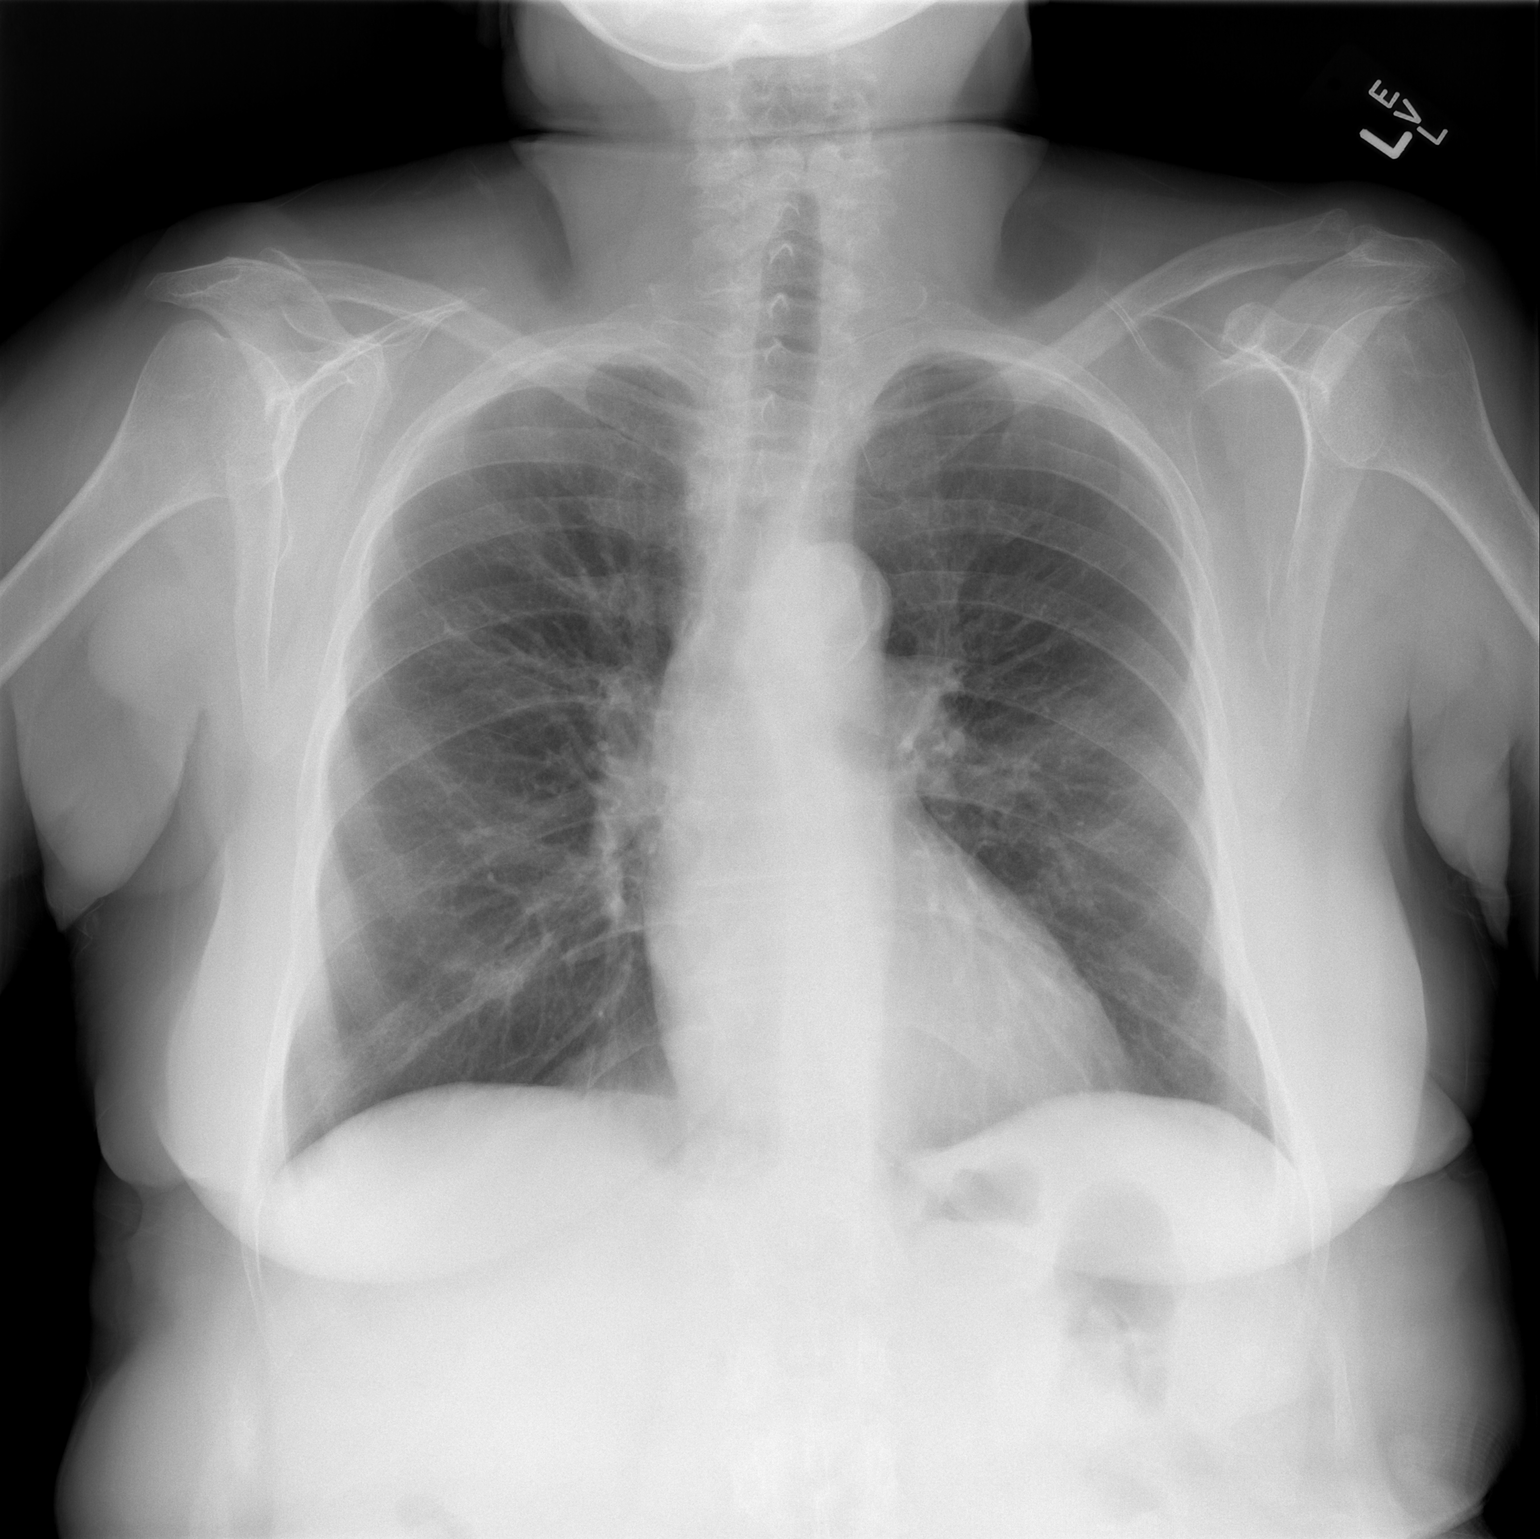

[w chest lat]
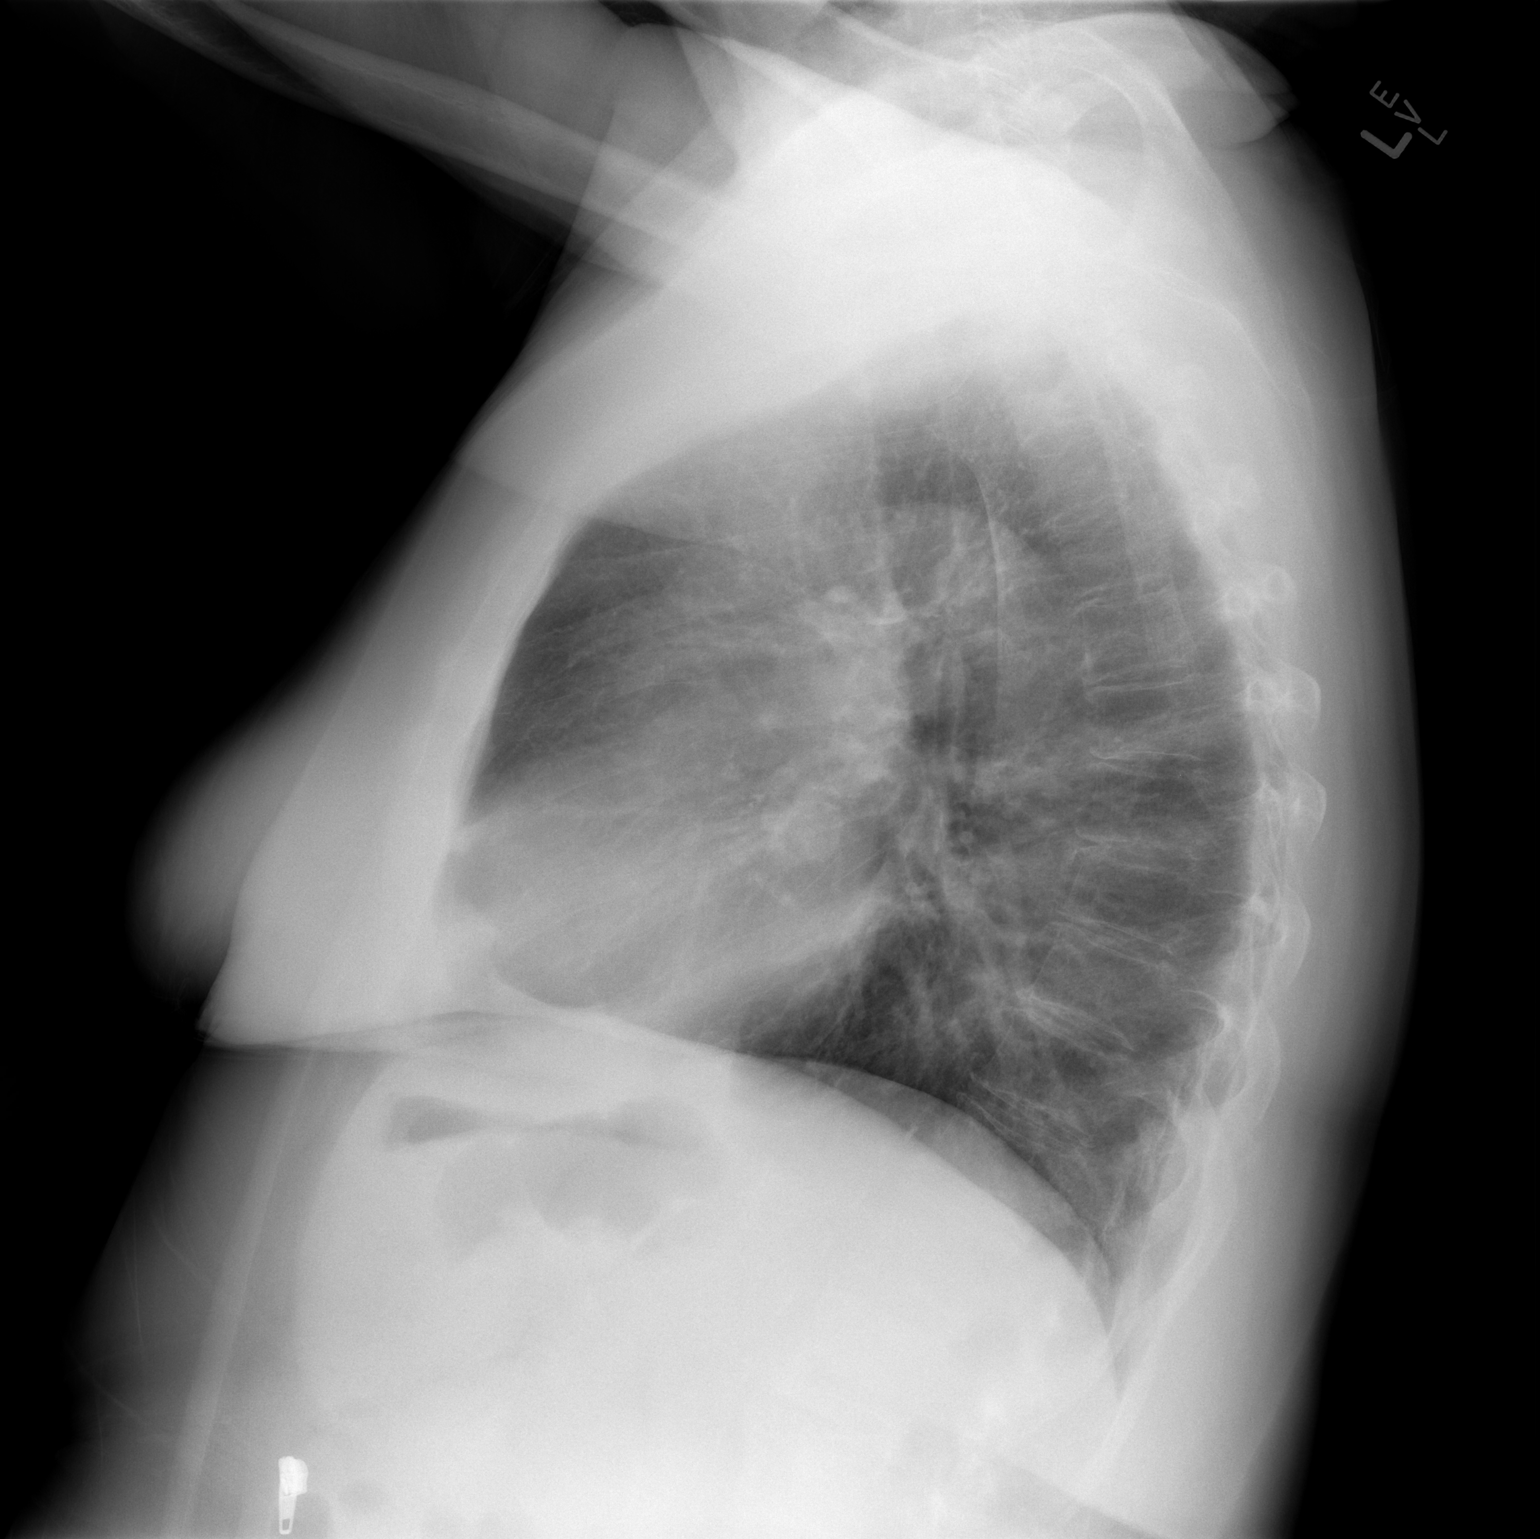

[2 of 2 positions shown; findings below may reference images not displayed]

FINDINGS: The heart size and mediastinal contours are within normal limits.
Left lung is clear. Possible rounded density is seen in the right
lower lobe which may represent superimposition of densities, but
inflammation or nodule cannot be excluded. No pneumothorax or
pleural effusion is noted. Atherosclerosis of thoracic aorta is
noted. The visualized skeletal structures are unremarkable.
IMPRESSION: Aortic atherosclerosis. Rounded density seen in right lung base
which may represent superimposition of normal densities, but focal
inflammation or nodule cannot be excluded. Further evaluation with
short-term follow-up radiographs or CT scan is recommended.

## 2018-12-21 IMAGING — CT CT ANGIO CHEST
4 of 15 series · 13 of 37 positions shown · IV contrast (APPLIED)
Comparison: No priors.

CLINICAL DATA: 80-year-old female with history of severe aortic
stenosis. Preprocedural study prior to potential transcatheter
aortic valve replacement (TAVR) procedure.

EXAM:
CT ANGIOGRAPHY CHEST, ABDOMEN AND PELVIS
TECHNIQUE: Multidetector CT imaging through the chest, abdomen and pelvis was
performed using the standard protocol during bolus administration of
intravenous contrast. Multiplanar reconstructed images and MIPs were
obtained and reviewed to evaluate the vascular anatomy.
CONTRAST:  100 mL of Isovue 370.

[Series 5: best diast 84 % · axial · 0.44mm/px · z∈[-367,-243]mm · 3 of 620 slices shown]
[im 155/620  lung]
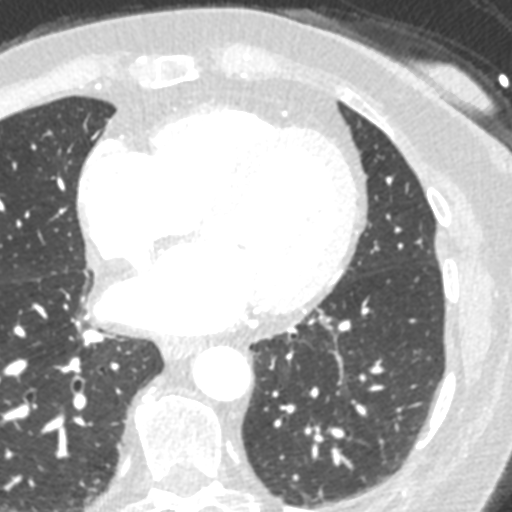
[im 310/620  lung]
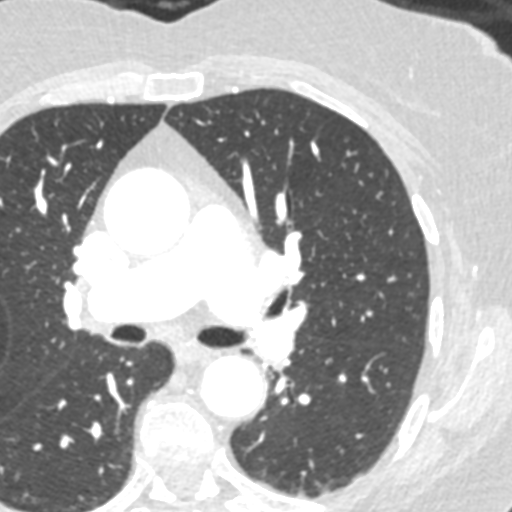
[im 465/620  lung]
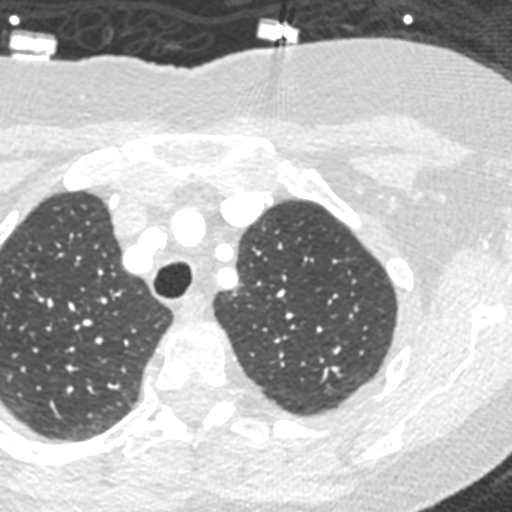

[Series 6: best syst 46 % · axial · 0.44mm/px · z∈[-380,-231]mm · 4 of 620 slices shown]
[im 124/620  lung]
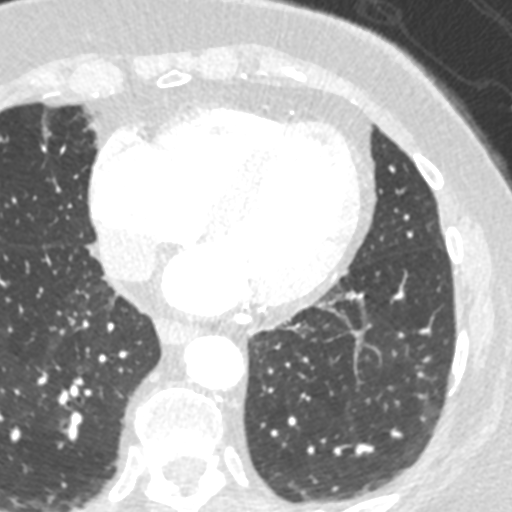
[im 248/620  lung]
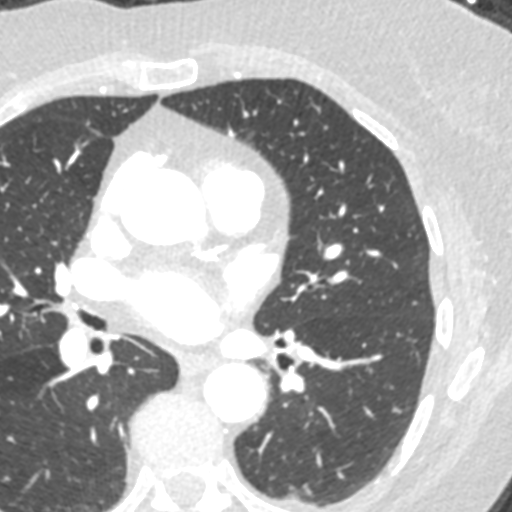
[im 372/620  lung]
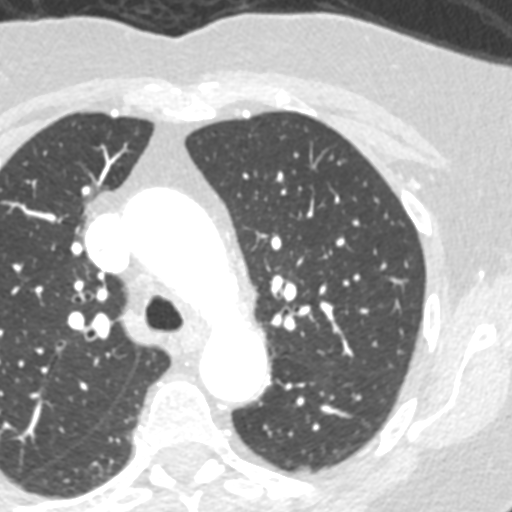
[im 496/620  lung]
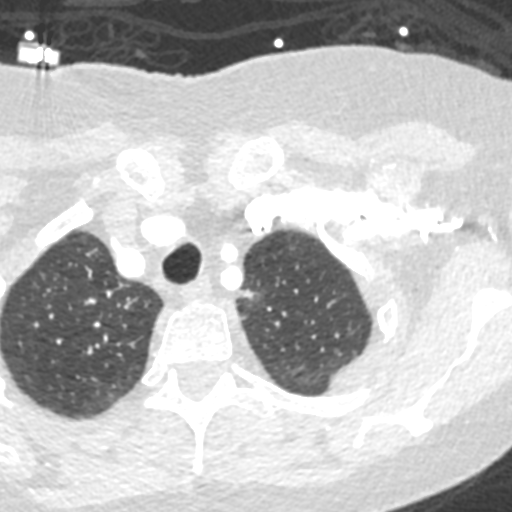

[Series 13: ax thins · axial · 0.83mm/px · z∈[-631,-331]mm · 3 of 601 slices shown (1 of 2)]
[im 151/601  lung]
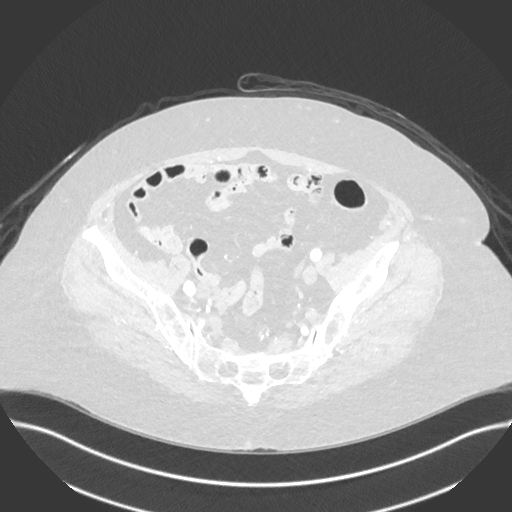
[im 301/601  mediastinal]
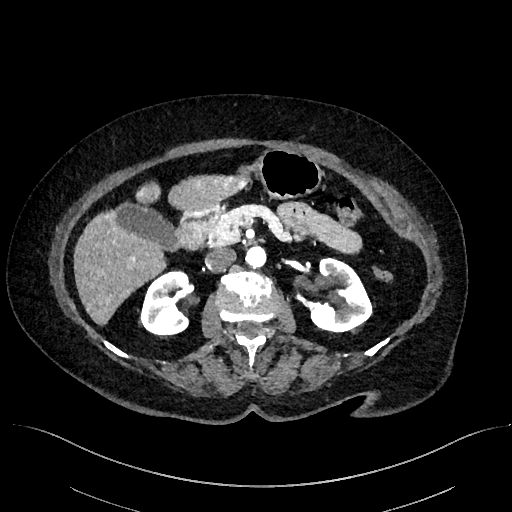
[im 451/601  lung]
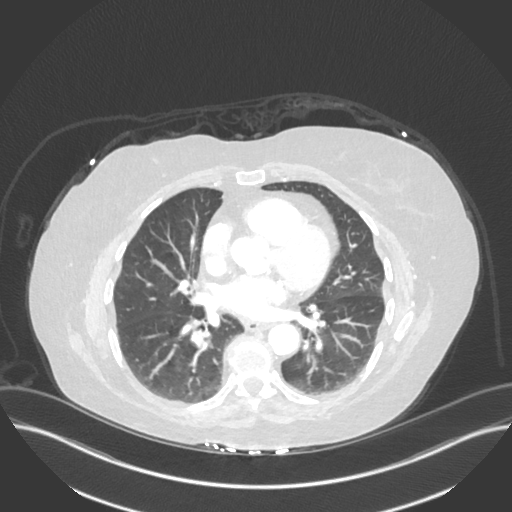

[Series 20: ax thins · axial · 0.59mm/px · z∈[-631,-331]mm · 3 of 601 slices shown (2 of 2)]
[im 151/601  lung]
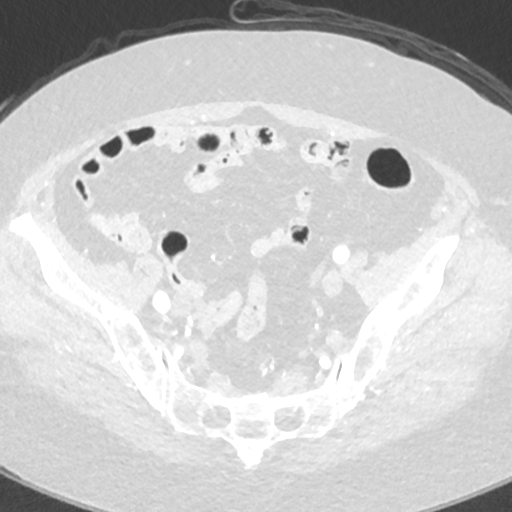
[im 301/601  lung]
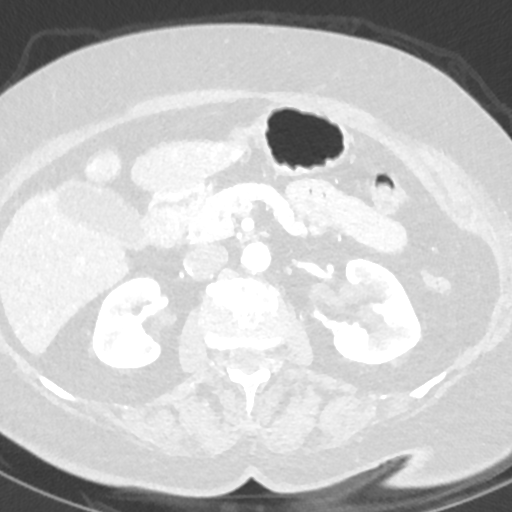
[im 451/601  lung]
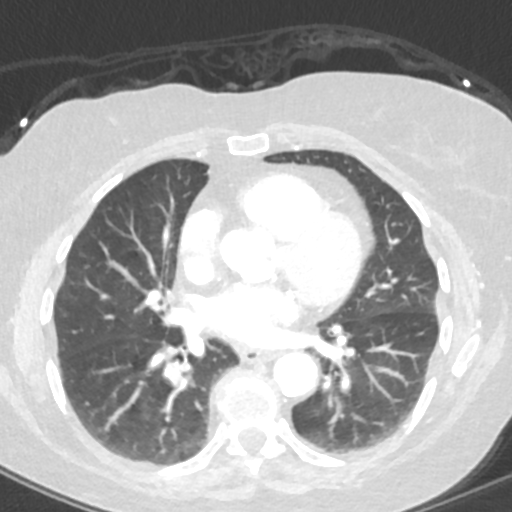

[13 of 37 positions shown; findings below may reference images not displayed]

FINDINGS: CTA CHEST FINDINGS

Cardiovascular: Heart size is mildly enlarged. There is no
significant pericardial fluid, thickening or pericardial
calcification. There is aortic atherosclerosis, as well as
atherosclerosis of the great vessels of the mediastinum and the
coronary arteries, including calcified atherosclerotic plaque in the
left main, left anterior descending and left circumflex coronary
arteries. Severe thickening calcification of the aortic valve.
Calcification of the mitral annulus.

Mediastinum/Lymph Nodes: No pathologically enlarged mediastinal or
hilar lymph nodes. Please note that accurate exclusion of hilar
adenopathy is limited on noncontrast CT scans. Small hiatal hernia.
No axillary lymphadenopathy.

Lungs/Pleura: 3 x 9 mm (mean diameter of 6 mm) subpleural nodule in
the posterior aspect of the right lower lobe (axial image 68 of
series 14). No other larger more suspicious appearing pulmonary
nodules or masses. No acute consolidative airspace disease. No
pleural effusions.

Musculoskeletal/Soft Tissues: There are no aggressive appearing
lytic or blastic lesions noted in the visualized portions of the
skeleton.

CTA ABDOMEN AND PELVIS FINDINGS

Hepatobiliary: Diffuse low attenuation throughout the hepatic
parenchyma, strongly suggestive of hepatic steatosis (difficult to
say for certain on today's contrast enhanced examination). No
definite cystic or solid hepatic lesions are identified. No intra or
extrahepatic biliary ductal dilatation. Gallbladder is unremarkable
in appearance.

Pancreas: No pancreatic mass. No pancreatic ductal dilatation. No
pancreatic or peripancreatic fluid or inflammatory changes.

Spleen: Unremarkable.

Adrenals/Urinary Tract: Bilateral kidneys and bilateral adrenal
glands are normal in appearance. No hydroureteronephrosis. Urinary
bladder is normal in appearance.

Stomach/Bowel: Normal appearance of the stomach. No pathologic
dilatation of small bowel or colon. A few scattered colonic
diverticulae are noted, without surrounding inflammatory changes to
suggest an acute diverticulitis at this time. Normal appendix.

Vascular/Lymphatic: Aortic atherosclerosis, with vascular findings
and measurements pertinent to potential TAVR procedure, as detailed
below. No aneurysm or dissection noted in the abdominal or pelvic
vasculature. The celiac axis, superior mesenteric artery and
inferior mesenteric artery are all patent without hemodynamically
significant stenosis. Renal arteries are patent bilaterally (2 on
the right and 1 on the left) without definite hemodynamically
significant stenoses. No lymphadenopathy noted in the abdomen or
pelvis.

Reproductive: 4.5 x 4.9 x 4.3 cm heterogeneously enhancing lesion in
the right side of the uterine body, incompletely characterized but
likely to represent a a leiomyoma. Ovaries are atrophic.

Other: No significant volume of ascites.  No pneumoperitoneum.

Musculoskeletal: There are no aggressive appearing lytic or blastic
lesions noted in the visualized portions of the skeleton.

VASCULAR MEASUREMENTS PERTINENT TO TAVR:

AORTA:

Minimal Aortic Diameter -  14 x 14 mm

Severity of Aortic Calcification -  mild

RIGHT PELVIS:

Right Common Iliac Artery -

Minimal Diameter - 100. x 10.4 mm

Tortuosity - mild

Calcification - mild

Right External Iliac Artery -

Minimal Diameter - 8.3 x 7.7 mm

Tortuosity - severe

Calcification - mild

Right Common Femoral Artery -

Minimal Diameter - 7.7 x 8.7 mm

Tortuosity - mild

Calcification - mild

LEFT PELVIS:

Left Common Iliac Artery -

Minimal Diameter - 9.9 x 9.2 mm

Tortuosity - mild

Calcification - mild

Left External Iliac Artery -

Minimal Diameter - 8.2 x 7.9 mm

Tortuosity - severe

Calcification - minimal

Left Common Femoral Artery -

Minimal Diameter - 8.5 x 8.7 mm

Tortuosity - mild

Calcification - none

Review of the MIP images confirms the above findings.
IMPRESSION: 1. Vascular findings and measurements pertinent to potential TAVR
procedure, as detailed above. This patient does have suitable pelvic
arterial access bilaterally.
2. Severe thickening calcification of the aortic valve, compatible
with the reported clinical history of severe aortic stenosis.
3. Aortic atherosclerosis, in addition to left main and 2 vessel
coronary artery disease. Assessment for potential risk factor
modification, dietary therapy or pharmacologic therapy may be
warranted, if clinically indicated.
4. Probable fibroid in the uterus, as discussed above.
5. Additional incidental findings, as above.
Aortic Atherosclerosis ([id number]).

## 2019-01-20 ENCOUNTER — Ambulatory Visit (INDEPENDENT_AMBULATORY_CARE_PROVIDER_SITE_OTHER): Payer: Medicare Other | Admitting: *Deleted

## 2019-01-20 DIAGNOSIS — I442 Atrioventricular block, complete: Secondary | ICD-10-CM

## 2019-01-20 LAB — CUP PACEART REMOTE DEVICE CHECK
Battery Remaining Longevity: 106 mo
Battery Remaining Percentage: 95.5 %
Battery Voltage: 2.99 V
Brady Statistic AP VP Percent: 27 %
Brady Statistic AP VS Percent: 1 %
Brady Statistic AS VP Percent: 73 %
Brady Statistic AS VS Percent: 1 %
Brady Statistic RA Percent Paced: 26 %
Brady Statistic RV Percent Paced: 99 %
Date Time Interrogation Session: 20210113020016
Implantable Lead Implant Date: 20181014
Implantable Lead Implant Date: 20181014
Implantable Lead Location: 753859
Implantable Lead Location: 753860
Implantable Pulse Generator Implant Date: 20181014
Lead Channel Impedance Value: 400 Ohm
Lead Channel Impedance Value: 540 Ohm
Lead Channel Pacing Threshold Amplitude: 0.5 V
Lead Channel Pacing Threshold Amplitude: 0.75 V
Lead Channel Pacing Threshold Pulse Width: 0.5 ms
Lead Channel Pacing Threshold Pulse Width: 0.5 ms
Lead Channel Sensing Intrinsic Amplitude: 3.6 mV
Lead Channel Sensing Intrinsic Amplitude: 9.6 mV
Lead Channel Setting Pacing Amplitude: 2 V
Lead Channel Setting Pacing Amplitude: 2.5 V
Lead Channel Setting Pacing Pulse Width: 0.5 ms
Lead Channel Setting Sensing Sensitivity: 4 mV
Pulse Gen Model: 2272
Pulse Gen Serial Number: 8951116

## 2019-02-01 ENCOUNTER — Telehealth: Payer: Self-pay | Admitting: Cardiology

## 2019-02-01 NOTE — Telephone Encounter (Signed)
It is really issue for primary care physician or maybe neurologist.  I think what we can offer is to do basic laboratory test make sure were not missing anything obvious like anemia or kidney dysfunction or liver dysfunction, therefore if they willing I would propose to check CBC, complete metabolic panel, TSH but truly she need to look for heart failure and with neurology or primary care physician

## 2019-02-01 NOTE — Telephone Encounter (Signed)
  Daughter is calling because she would like to discuss putting her mom on some medication for her memory loss. She states the patient called her this morning stating that she was confused and didn't know what day it is. She states that her mom is new to the PCP she has and she feels he is not taking the situation seriously.

## 2019-02-12 DIAGNOSIS — R413 Other amnesia: Secondary | ICD-10-CM

## 2019-02-12 HISTORY — DX: Other amnesia: R41.3

## 2019-04-14 ENCOUNTER — Telehealth: Payer: Self-pay | Admitting: Internal Medicine

## 2019-04-14 NOTE — Telephone Encounter (Signed)
Spoke with pt daughter.  Advised that if the [employer] does not work on the landline at her home, call and we can arrange for a cell adaptor that would attach to existing monitor.

## 2019-04-14 NOTE — Telephone Encounter (Signed)
New message     Patient has a device.  She has been staying at her daughter's house for the last month and not sure when patient will go home.  Daughter is calling to see if patient can get a portable machine to check her device or will she have to bring her machine from home.  Patient's home check machine is connected to her landline phone.  Please call.

## 2019-05-06 ENCOUNTER — Telehealth: Payer: Self-pay

## 2019-05-06 NOTE — Telephone Encounter (Signed)
Ok with me 

## 2019-05-06 NOTE — Telephone Encounter (Signed)
The pt daughter Robin Sutton states the pt is moving in with her. The pt monitor uses a land line but she no longer have a land line. I message [employer] rep to get the pt a cell adapter for her home monitor.   Cell adapter ordered 05/06/2019.  The pt daughter states now that her mother is living in [place], can the pt see Dr. Curt Bears instead of Dr. Rayann Heman because it is hard getting the pt to [facility]? I told her I will send both Dr. Rayann Heman and Dr. Curt Bears a phone note. If both doctors agree then yes. I told her I could not approve it but both Doctors will need to be okay with the switch.   The pt daughter verbalized understanding and thanked me for my help.

## 2019-05-11 ENCOUNTER — Encounter: Payer: Self-pay | Admitting: Cardiology

## 2019-05-11 ENCOUNTER — Ambulatory Visit (INDEPENDENT_AMBULATORY_CARE_PROVIDER_SITE_OTHER): Payer: Medicare Other | Admitting: Cardiology

## 2019-05-11 ENCOUNTER — Other Ambulatory Visit: Payer: Self-pay

## 2019-05-11 ENCOUNTER — Telehealth: Payer: Self-pay | Admitting: Cardiology

## 2019-05-11 VITALS — BP 114/60 | HR 69 | Ht 63.0 in | Wt 162.0 lb

## 2019-05-11 DIAGNOSIS — I10 Essential (primary) hypertension: Secondary | ICD-10-CM | POA: Diagnosis not present

## 2019-05-11 DIAGNOSIS — Z950 Presence of cardiac pacemaker: Secondary | ICD-10-CM

## 2019-05-11 DIAGNOSIS — I442 Atrioventricular block, complete: Secondary | ICD-10-CM

## 2019-05-11 DIAGNOSIS — Z952 Presence of prosthetic heart valve: Secondary | ICD-10-CM

## 2019-05-11 NOTE — Patient Instructions (Signed)
Medication Instructions:  °Your physician recommends that you continue on your current medications as directed. Please refer to the Current Medication list given to you today. ° °*If you need a refill on your cardiac medications before your next appointment, please call your pharmacy* ° ° °Lab Work: °None. ° °If you have labs (blood work) drawn today and your tests are completely normal, you will receive your results only by: °• MyChart Message (if you have MyChart) OR °• A paper copy in the mail °If you have any lab test that is abnormal or we need to change your treatment, we will call you to review the results. ° ° °Testing/Procedures: °Your physician has requested that you have an echocardiogram. Echocardiography is a painless test that uses sound waves to create images of your heart. It provides your doctor with information about the size and shape of your heart and how well your heart’s chambers and valves are working. This procedure takes approximately one hour. There are no restrictions for this procedure. ° ° ° ° °Follow-Up: °At [facility], you and your health needs are our priority.  As part of our continuing mission to provide you with exceptional heart care, we have created designated Provider Care Teams.  These Care Teams include your primary Cardiologist (physician) and Advanced Practice Providers (APPs -  Physician Assistants and Nurse Practitioners) who all work together to provide you with the care you need, when you need it. ° °We recommend signing up for the patient portal called "MyChart".  Sign up information is provided on this After Visit Summary.  MyChart is used to connect with patients for Virtual Visits (Telemedicine).  Patients are able to view lab/test results, encounter notes, upcoming appointments, etc.  Non-urgent messages can be sent to your provider as well.   °To learn more about what you can do with MyChart, go to [email or web address].   ° °Your next appointment:   °6  month(s) ° °The format for your next appointment:   °In Person ° °Provider:   °Robert Krasowski, MD ° ° °Other Instructions ° ° °Echocardiogram °An echocardiogram is a procedure that uses painless sound waves (ultrasound) to produce an image of the heart. Images from an echocardiogram can provide important information about: °· Signs of coronary artery disease (CAD). °· Aneurysm detection. An aneurysm is a weak or damaged part of an artery wall that bulges out from the normal force of blood pumping through the body. °· Heart size and shape. Changes in the size or shape of the heart can be associated with certain conditions, including heart failure, aneurysm, and CAD. °· Heart muscle function. °· Heart valve function. °· Signs of a past heart attack. °· Fluid buildup around the heart. °· Thickening of the heart muscle. °· A tumor or infectious growth around the heart valves. °Tell a health care provider about: °· Any allergies you have. °· All medicines you are taking, including vitamins, herbs, eye drops, creams, and over-the-counter medicines. °· Any blood disorders you have. °· Any surgeries you have had. °· Any medical conditions you have. °· Whether you are pregnant or may be pregnant. °What are the risks? °Generally, this is a safe procedure. However, problems may occur, including: °· Allergic reaction to dye (contrast) that may be used during the procedure. °What happens before the procedure? °No specific preparation is needed. You may eat and drink normally. °What happens during the procedure? ° °· An IV tube may be inserted into one of your veins. °· You may   receive contrast through this tube. A contrast is an injection that improves the quality of the pictures from your heart. °· A gel will be applied to your chest. °· A wand-like tool (transducer) will be moved over your chest. The gel will help to transmit the sound waves from the transducer. °· The sound waves will harmlessly bounce off of your heart to  allow the heart images to be captured in real-time motion. The images will be recorded on a computer. °The procedure may vary among health care providers and hospitals. °What happens after the procedure? °· You may return to your normal, everyday life, including diet, activities, and medicines, unless your health care provider tells you not to do that. °Summary °· An echocardiogram is a procedure that uses painless sound waves (ultrasound) to produce an image of the heart. °· Images from an echocardiogram can provide important information about the size and shape of your heart, heart muscle function, heart valve function, and fluid buildup around your heart. °· You do not need to do anything to prepare before this procedure. You may eat and drink normally. °· After the echocardiogram is completed, you may return to your normal, everyday life, unless your health care provider tells you not to do that. °This information is not intended to replace advice given to you by your health care provider. Make sure you discuss any questions you have with your health care provider. °Document Revised: 04/16/2018 Document Reviewed: 01/27/2016 °Elsevier Patient Education © 2020 Elsevier Inc. ° ° °

## 2019-05-11 NOTE — Progress Notes (Signed)
Cardiology Office Note:    Date:  05/11/2019   ID:  Robin Sutton, DOB 03/05/1935, MRN [id number]  PCP:  Maylon Cos, NP  Cardiologist:  Jenne Campus, MD    Referring MD: Maylon Cos, NP   No chief complaint on file. Doing well  History of Present Illness:    Robin Sutton is a 84 y.o. female Past Medical History Significant for TAVR October 2018, status post dual-chamber pacemaker implantation which is Environmental health practitioner l device done December,, essential hypertension dyslipidemia.  She comes today for follow-up overall she is doing well.  Denies having a chest pain, tightness, pressure, burning in the chest.  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 Past Medical History:  Diagnosis Date  . History of CVA (cerebrovascular accident)   . HLD (hyperlipidemia)   . Hypertension   . S/P placement of cardiac pacemaker    a. developed CHB s/p TAVR and got a St. Jude Medical Assurity MRI compatible dual chamber [id number] (serial number  [id number])  . S/P TAVR (transcatheter aortic valve replacement)    a. 10/2016: 23 mm Medtronic Evolut Pro transcatheter heart valve placed via percutaneous right transfemoral approach     Past Surgical History:  Procedure Laterality Date  . BREAST BIOPSY    . PACEMAKER IMPLANT N/A 10/20/2016   St Jude Medical Assurity MRI conditional  dual-chamber pacemaker for symptomatic complete heart block by Dr Rayann Heman  . RIGHT/LEFT HEART CATH AND CORONARY ANGIOGRAPHY N/A 09/11/2016   Procedure: RIGHT/LEFT HEART CATH AND CORONARY ANGIOGRAPHY;  Surgeon: Sherren Mocha, MD;  Location: [facility];  Service: Cardiovascular;  Laterality: N/A;  . TEE WITHOUT CARDIOVERSION N/A 10/17/2016   Procedure: TRANSESOPHAGEAL ECHOCARDIOGRAM (TEE);  Surgeon: Sherren Mocha, MD;  Location: [facility];  Service: Open Heart Surgery;  Laterality: N/A;  . TONSILLECTOMY    . TRANSCATHETER AORTIC VALVE REPLACEMENT,  TRANSFEMORAL N/A 10/17/2016   Procedure: TRANSCATHETER AORTIC VALVE REPLACEMENT, TRANSFEMORAL;  Surgeon: Sherren Mocha, MD;  Location: [facility];  Service: Open Heart Surgery;  Laterality: N/A;    Current Medications: Current Meds  Medication Sig  . amoxicillin (AMOXIL) 500 MG capsule Take 500 mg by mouth 3 (three) times daily. Take 4 capsules by mouth once as needed for dental work  . aspirin EC 81 MG EC tablet Take 1 tablet (81 mg total) by mouth daily.  . Coenzyme Q10 200 MG capsule Take 200 mg by mouth daily.  Marland Kitchen lisinopril (PRINIVIL,ZESTRIL) 10 MG tablet Take 10 mg by mouth every evening.   . nitroGLYCERIN (NITROSTAT) 0.4 MG SL tablet Place 1 tablet (0.4 mg total) under the tongue every 5 (five) minutes as needed for chest pain.  Marland Kitchen omeprazole (PRILOSEC) 40 MG capsule Take 40 mg by mouth daily as needed.  . polyethylene glycol powder (GLYCOLAX/MIRALAX) powder Take 17 g by mouth daily as needed (consitipation).   . pravastatin (PRAVACHOL) 80 MG tablet Take 1 tablet (80 mg total) by mouth every evening.  . Vitamin D, Ergocalciferol, (DRISDOL) 50000 units CAPS capsule Take 50,000 Units by mouth every Monday.      Allergies:   Adhesive [tape]   Social History   Socioeconomic History  . Marital status: Widowed    Spouse name: Not on file  . Number of children: Not on file  . Years of education: Not on file  . Highest education level: Not on file  Occupational History  . Not on file  Tobacco Use  . Smoking status: Never Smoker  . Smokeless tobacco: Never Used  Substance and Sexual Activity  . Alcohol use: No  . Drug use: No  . Sexual activity: Not on file  Other Topics Concern  . Not on file  Social History Narrative  . Not on file   Social Determinants of Health   Financial Resource Strain:   . Difficulty of Paying Living Expenses:   Food Insecurity:   . Worried About Charity fundraiser in the Last Year:   . Arboriculturist in the Last Year:   Transportation Needs:   .  Film/video editor (Medical):   Marland Kitchen Lack of Transportation (Non-Medical):   Physical Activity:   . Days of Exercise per Week:   . Minutes of Exercise per Session:   Stress:   . Feeling of Stress :   Social Connections:   . Frequency of Communication with Friends and Family:   . Frequency of Social Gatherings with Friends and Family:   . Attends Religious Services:   . Active Member of Clubs or Organizations:   . Attends Archivist Meetings:   Marland Kitchen Marital Status:      Family History: The patient's family history includes Diabetes in her maternal grandfather; Heart disease in her brother, maternal grandfather, and mother; Hypertension in her brother and mother; Kidney failure in her father; Stroke in her mother. ROS:   Please see the history of present illness.    All 14 point review of systems negative except as described per history of present illness  EKGs/Labs/Other Studies Reviewed:      Recent Labs: No results found for requested labs within last 8760 hours.  Recent Lipid Panel No results found for: CHOL, TRIG, HDL, CHOLHDL, VLDL, LDLCALC, LDLDIRECT  Physical Exam:    VS:  BP 114/60   Pulse 69   Ht 5\' 3"  (1.6 m)   Wt 162 lb (73.5 kg)   SpO2 96%   BMI 28.70 kg/m     Wt Readings from Last 3 Encounters:  05/11/19 162 lb (73.5 kg)  10/27/18 170 lb (77.1 kg)  07/13/18 163 lb (73.9 kg)     GEN:  Well nourished, well developed in no acute distress HEENT: Normal NECK: No JVD; No carotid bruits LYMPHATICS: No lymphadenopathy CARDIAC: RRR, systolic ejection murmur grade 1/6 best heard right upper portion of the sternum, no rubs, no gallops RESPIRATORY:  Clear to auscultation without rales, wheezing or rhonchi  ABDOMEN: Soft, non-tender, non-distended MUSCULOSKELETAL:  No edema; No deformity  SKIN: Warm and dry LOWER EXTREMITIES: no swelling NEUROLOGIC:  Alert and oriented x 3 PSYCHIATRIC:  Normal affect   ASSESSMENT:    1. S/P TAVR (transcatheter  aortic valve replacement)   2. Pacemaker   3. CHB (complete heart block) (HCC)   4. Essential hypertension    PLAN:    In order of problems listed above:  1. Status post transcatheter aortic valve replacement, we will schedule her to have echocardiogram, likely hemodynamically she is doing well.  We will continue present management. 2. Pacemaker implantation.  Device is functioning well 9.1 years left in the device. 3. Complete heart block has been addressed with a pacemaker.  EKG done today show ventricular paced rhythm.  We will continue monitoring. 4. Essential hypertension blood pressure well controlled continue present management.   Medication Adjustments/Labs and Tests Ordered: Current medicines are reviewed at length with the patient today.  Concerns regarding medicines are outlined above.  No orders of the defined types were placed in this encounter.  Medication changes: No orders of the defined types were placed in this encounter.   Signed, Park Liter, MD, [facility] 05/11/2019 4:39 PM    [place]

## 2019-05-11 NOTE — Telephone Encounter (Signed)
   Pt's daughter requesting if she can come in with the pt to her appt later today. She said pt is showing sign of dementia so she needs to be there to help/assist her  Please advise

## 2019-05-12 NOTE — Telephone Encounter (Signed)
Per Dr. Rayann Heman- ok to change follow up to Dr. Curt Bears in [place].  Will forward to scheduler.

## 2019-05-12 NOTE — Telephone Encounter (Signed)
That should be fine. 

## 2019-05-14 ENCOUNTER — Ambulatory Visit (INDEPENDENT_AMBULATORY_CARE_PROVIDER_SITE_OTHER): Payer: Medicare Other | Admitting: *Deleted

## 2019-05-14 ENCOUNTER — Telehealth: Payer: Self-pay | Admitting: Cardiology

## 2019-05-14 DIAGNOSIS — I442 Atrioventricular block, complete: Secondary | ICD-10-CM

## 2019-05-14 NOTE — Telephone Encounter (Signed)
New Message:    Daughter says she needs to discuss pt's Med List. She says it is incorrect.

## 2019-05-14 NOTE — Telephone Encounter (Signed)
I spoke with the patients daughter on the phone and she went over the patients current medication list with me. Her med-list is currently up to date now. She does have two questions however.   1. The patient is taking Aspirin and Vitamin E together. The daughter is wanting to know if this is ok with Dr. Agustin Cree.  2. The patient has not been taking her coenzyme Q10 and they are wondering if Dr. Raliegh Ip would recommend that she restarts that or if she can just stop taking it.   I am routing to Dr. Raliegh Ip for further recommendations.

## 2019-05-15 LAB — CUP PACEART REMOTE DEVICE CHECK
Battery Remaining Longevity: 104 mo
Battery Remaining Percentage: 95.5 %
Battery Voltage: 2.99 V
Brady Statistic AP VP Percent: 30 %
Brady Statistic AP VS Percent: 1 %
Brady Statistic AS VP Percent: 70 %
Brady Statistic AS VS Percent: 1 %
Brady Statistic RA Percent Paced: 28 %
Brady Statistic RV Percent Paced: 99 %
Date Time Interrogation Session: 20210429191804
Implantable Lead Implant Date: 20181014
Implantable Lead Implant Date: 20181014
Implantable Lead Location: 753859
Implantable Lead Location: 753860
Implantable Pulse Generator Implant Date: 20181014
Lead Channel Impedance Value: 400 Ohm
Lead Channel Impedance Value: 510 Ohm
Lead Channel Pacing Threshold Amplitude: 0.5 V
Lead Channel Pacing Threshold Amplitude: 0.75 V
Lead Channel Pacing Threshold Pulse Width: 0.5 ms
Lead Channel Pacing Threshold Pulse Width: 0.5 ms
Lead Channel Sensing Intrinsic Amplitude: 0.6 mV
Lead Channel Sensing Intrinsic Amplitude: 9.6 mV
Lead Channel Setting Pacing Amplitude: 2 V
Lead Channel Setting Pacing Amplitude: 2.5 V
Lead Channel Setting Pacing Pulse Width: 0.5 ms
Lead Channel Setting Sensing Sensitivity: 4 mV
Pulse Gen Model: 2272
Pulse Gen Serial Number: 8951116

## 2019-05-17 NOTE — Telephone Encounter (Signed)
Left message for patient to return call.

## 2019-05-17 NOTE — Telephone Encounter (Signed)
Answering question #1, there is no problem taking vitamin E and aspirin together.  In terms of coenzyme Q 10.  There is no clear-cut indication for it.  And data not solid in terms of potential benefits of this my approach to this is if she takes this she thinks it is helping her that should be fine.  She is not hurting herself by taking this medication.

## 2019-05-17 NOTE — Progress Notes (Signed)
Remote pacemaker transmission.   

## 2019-05-18 NOTE — Telephone Encounter (Signed)
Called patient informed her of Dr. Wendy Poet advice below she verbally understood. No further questions.

## 2019-05-20 ENCOUNTER — Telehealth: Payer: Self-pay

## 2019-05-20 NOTE — Telephone Encounter (Signed)
New Message  Patient's daughter is calling in to speak with the device clinic to get assistance with sending a transmission. Please give patient/patient's daughter a call back.

## 2019-05-21 ENCOUNTER — Ambulatory Visit (INDEPENDENT_AMBULATORY_CARE_PROVIDER_SITE_OTHER): Payer: Medicare Other | Admitting: *Deleted

## 2019-05-21 DIAGNOSIS — I442 Atrioventricular block, complete: Secondary | ICD-10-CM | POA: Diagnosis not present

## 2019-05-21 NOTE — Telephone Encounter (Signed)
I help the daughter send the transmission. Transmission received. I added the pt to the schedule and gave her the next send date.

## 2019-05-21 NOTE — Telephone Encounter (Signed)
The pt daughter states they were not at home. I tried to call her back to leave a voicemail with my direct office number but her mailbox was full. I will try to call them back at 3:20 pm.

## 2019-05-22 LAB — CUP PACEART REMOTE DEVICE CHECK
Battery Remaining Longevity: 106 mo
Battery Remaining Percentage: 95.5 %
Battery Voltage: 2.99 V
Brady Statistic AP VP Percent: 31 %
Brady Statistic AP VS Percent: 1 %
Brady Statistic AS VP Percent: 69 %
Brady Statistic AS VS Percent: 1 %
Brady Statistic RA Percent Paced: 29 %
Brady Statistic RV Percent Paced: 99 %
Date Time Interrogation Session: 20210514152158
Implantable Lead Implant Date: 20181014
Implantable Lead Implant Date: 20181014
Implantable Lead Location: 753859
Implantable Lead Location: 753860
Implantable Pulse Generator Implant Date: 20181014
Lead Channel Impedance Value: 410 Ohm
Lead Channel Impedance Value: 540 Ohm
Lead Channel Pacing Threshold Amplitude: 0.5 V
Lead Channel Pacing Threshold Amplitude: 0.75 V
Lead Channel Pacing Threshold Pulse Width: 0.5 ms
Lead Channel Pacing Threshold Pulse Width: 0.5 ms
Lead Channel Sensing Intrinsic Amplitude: 1.8 mV
Lead Channel Sensing Intrinsic Amplitude: 9.6 mV
Lead Channel Setting Pacing Amplitude: 2 V
Lead Channel Setting Pacing Amplitude: 2.5 V
Lead Channel Setting Pacing Pulse Width: 0.5 ms
Lead Channel Setting Sensing Sensitivity: 4 mV
Pulse Gen Model: 2272
Pulse Gen Serial Number: 8951116

## 2019-05-24 NOTE — Progress Notes (Signed)
Remote pacemaker transmission.   

## 2019-05-27 ENCOUNTER — Other Ambulatory Visit: Payer: Self-pay

## 2019-05-27 ENCOUNTER — Ambulatory Visit (INDEPENDENT_AMBULATORY_CARE_PROVIDER_SITE_OTHER): Payer: Medicare Other

## 2019-05-27 DIAGNOSIS — Z952 Presence of prosthetic heart valve: Secondary | ICD-10-CM

## 2019-06-10 ENCOUNTER — Telehealth: Payer: Self-pay | Admitting: Cardiology

## 2019-06-10 NOTE — Telephone Encounter (Signed)
Called informed patient's daughter per dpr of results. No further questions.

## 2019-06-10 NOTE — Telephone Encounter (Signed)
[name] the patient's daughter is returning Hayley's call in regards to her Echo results. Please advise.

## 2019-06-21 ENCOUNTER — Telehealth: Payer: Self-pay | Admitting: Cardiology

## 2019-06-21 NOTE — Telephone Encounter (Signed)
[name] is calling wanting to know if Robin Sutton is supposed to stay on the antibiotics Dr. Geraldo Pitter prescribed due to her having a dentist appointment on Wednesday, 06/23/19. [name] states she could not remember the name of the medication off hand. Please advise.

## 2019-06-21 NOTE — Telephone Encounter (Signed)
Called and spoke to patient's daughter per dpr. For the past 2 years the patient has had antibiotics when going for a dental procedure. The daughter is wondering if she stills needs this. She is supposed to go  be seen by her dentist they are trying to fit her dentures better. She has no extractions to do or anything it is just getting her dentures to fit correctly. Will consult with Dr. Agustin Cree if she needs antibiotics or not.

## 2019-06-22 NOTE — Telephone Encounter (Signed)
Yes, she needs to take amoxicillin before dental work but if that is only dentue fitting she may not need it. Dentist should decide, if no bleeding involve, no need

## 2019-06-22 NOTE — Telephone Encounter (Signed)
Left message for patient to return call.

## 2019-06-23 NOTE — Telephone Encounter (Signed)
Left message on patients voicemail to please return our call.   

## 2019-06-24 NOTE — Telephone Encounter (Signed)
Spoke to patient/patients daughter in regards to this and let them know Dr. Wendy Poet recommendation. They verbalize understanding and do not have any other issues or concerns at this time.    Encouraged patient to call back with any questions or concerns.

## 2019-07-18 NOTE — Progress Notes (Signed)
Electrophysiology Office Note   Date:  07/19/2019   ID:  Robin Sutton, DOB Oct 09, 1935, MRN [id number]  PCP:  Maris Berger, MD  Cardiologist:  Agustin Cree Primary Electrophysiologist:  Raimundo Corbit Meredith Leeds, MD    Chief Complaint: Pacemaker   History of Present Illness: Robin Sutton is a 84 y.o. female who is being seen today for the evaluation of pacemaker at the request of Jenne Campus. Presenting today for electrophysiology evaluation.  She has a history significant for hypertension, hyperlipidemia, aortic stenosis status post TAVR in 2018, and CVA.  She is now status post [employer] dual-chamber pacemaker implanted December 2018 for complete heart block.  Today, she denies symptoms of palpitations, chest pain, shortness of breath, orthopnea, PND, lower extremity edema, claudication, dizziness, presyncope, syncope, bleeding, or neurologic sequela. The patient is tolerating medications without difficulties.  She overall feels well.  She had no chest pain or shortness of breath.  She did do all of her daily activities without restriction.   Past Medical History:  Diagnosis Date  . History of CVA (cerebrovascular accident)   . HLD (hyperlipidemia)   . Hypertension   . S/P placement of cardiac pacemaker    a. developed CHB s/p TAVR and got a St. Jude Medical Assurity MRI compatible dual chamber [id number] (serial number  [id number])  . S/P TAVR (transcatheter aortic valve replacement)    a. 10/2016: 23 mm Medtronic Evolut Pro transcatheter heart valve placed via percutaneous right transfemoral approach    Past Surgical History:  Procedure Laterality Date  . BREAST BIOPSY    . PACEMAKER IMPLANT N/A 10/20/2016   St Jude Medical Assurity MRI conditional  dual-chamber pacemaker for symptomatic complete heart block by Dr Rayann Heman  . RIGHT/LEFT HEART CATH AND CORONARY ANGIOGRAPHY N/A 09/11/2016   Procedure: RIGHT/LEFT HEART CATH AND CORONARY ANGIOGRAPHY;  Surgeon: Sherren Mocha, MD;   Location: [place] [facility];  Service: Cardiovascular;  Laterality: N/A;  . TEE WITHOUT CARDIOVERSION N/A 10/17/2016   Procedure: TRANSESOPHAGEAL ECHOCARDIOGRAM (TEE);  Surgeon: Sherren Mocha, MD;  Location: [facility];  Service: Open Heart Surgery;  Laterality: N/A;  . TONSILLECTOMY    . TRANSCATHETER AORTIC VALVE REPLACEMENT, TRANSFEMORAL N/A 10/17/2016   Procedure: TRANSCATHETER AORTIC VALVE REPLACEMENT, TRANSFEMORAL;  Surgeon: Sherren Mocha, MD;  Location: [place];  Service: Open Heart Surgery;  Laterality: N/A;     Current Outpatient Medications  Medication Sig Dispense Refill  . amoxicillin (AMOXIL) 500 MG capsule Take 500 mg by mouth 3 (three) times daily. Take 4 capsules by mouth once as needed for dental work    . aspirin EC 81 MG EC tablet Take 1 tablet (81 mg total) by mouth daily.    . Coenzyme Q10 200 MG capsule Take 200 mg by mouth daily.    Marland Kitchen donepezil (ARICEPT) 5 MG tablet Take 5 mg by mouth at bedtime.    Marland Kitchen escitalopram (LEXAPRO) 5 MG tablet Take 5 mg by mouth daily.    Marland Kitchen lisinopril (PRINIVIL,ZESTRIL) 10 MG tablet Take 10 mg by mouth every evening.   1  . nitroGLYCERIN (NITROSTAT) 0.4 MG SL tablet Place 1 tablet (0.4 mg total) under the tongue every 5 (five) minutes as needed for chest pain. 25 tablet 3  . omeprazole (PRILOSEC) 40 MG capsule Take 40 mg by mouth daily as needed.    . polyethylene glycol powder (GLYCOLAX/MIRALAX) powder Take 17 g by mouth daily as needed (consitipation).     . rosuvastatin (CRESTOR) 20 MG tablet Take 20 mg by mouth  daily.    . Vitamin D, Ergocalciferol, (DRISDOL) 50000 units CAPS capsule Take 50,000 Units by mouth every Monday.   1  . vitamin E 1000 UNIT capsule Take 1,000 Units by mouth daily.     No current facility-administered medications for this visit.    Allergies:   Adhesive [tape]   Social History:  The patient  reports that she has never smoked. She has never used smokeless tobacco. She reports that she does not drink alcohol and  does not use drugs.   Family History:  The patient's family history includes Diabetes in her maternal grandfather; Heart disease in her brother, maternal grandfather, and mother; Hypertension in her brother and mother; Kidney failure in her father; Stroke in her mother.    ROS:  Please see the history of present illness.   Otherwise, review of systems is positive for none.   All other systems are reviewed and negative.    PHYSICAL EXAM: VS:  BP 116/68   Pulse 72   Ht 5\' 3"  (1.6 m)   Wt 161 lb (73 kg)   SpO2 98%   BMI 28.52 kg/m  , BMI Body mass index is 28.52 kg/m. GEN: Well nourished, well developed, in no acute distress  HEENT: normal  Neck: no JVD, carotid bruits, or masses Cardiac: RRR; no murmurs, rubs, or gallops,no edema  Respiratory:  clear to auscultation bilaterally, normal work of breathing GI: soft, nontender, nondistended, + BS MS: no deformity or atrophy  Skin: warm and dry, device pocket is well healed Neuro:  Strength and sensation are intact Psych: euthymic mood, full affect  EKG:  EKG is not ordered today. Personal review of the ekg ordered 05/12/19 shows V paced  Device interrogation is reviewed today in detail.  See PaceArt for details.   Recent Labs: No results found for requested labs within last 8760 hours.    Lipid Panel  No results found for: CHOL, TRIG, HDL, CHOLHDL, VLDL, LDLCALC, LDLDIRECT   Wt Readings from Last 3 Encounters:  07/19/19 161 lb (73 kg)  05/11/19 162 lb (73.5 kg)  10/27/18 170 lb (77.1 kg)      Other studies Reviewed: Additional studies/ records that were reviewed today include: TTE 05/28/19  Review of the above records today demonstrates:  1. Left ventricular ejection fraction, by estimation, is 55 to 60%. The  left ventricle has normal function. The left ventricle has no regional  wall motion abnormalities. Left ventricular diastolic parameters are  consistent with Grade I diastolic  dysfunction (impaired relaxation).   2. Mild to moderate mitral valve regurgitation.  3. TAVR not well visualized. Appears to have satisfactory function. Mean  and max gradient 17 and 27 mm Hg respectively.. The aortic valve was not  well visualized. There is a 23 mm Medtronic, stented (TAVR) valve present  in the aortic position.   ASSESSMENT AND PLAN:  1.  Complete heart block: Status post [facility] Jude dual-chamber pacemaker.  Device functioning appropriately.  No changes at this time.  2.  Aortic stenosis: Status post TAVR.  Plan per primary cardiology.  3.  Hypertension: Currently well controlled  Current medicines are reviewed at length with the patient today.   The patient does not have concerns regarding her medicines.  The following changes were made today:  none  Labs/ tests ordered today include:  No orders of the defined types were placed in this encounter.    Disposition:   FU with Aleayah [place] 1 year  Signed, Miniya Miguez Hassell Done  Dwayn Moravek, MD  07/19/2019 9:59 AM     [facility] [address] [place] [address] [phone] (office) [phone] (fax)

## 2019-07-19 ENCOUNTER — Ambulatory Visit (INDEPENDENT_AMBULATORY_CARE_PROVIDER_SITE_OTHER): Payer: Medicare Other | Admitting: Cardiology

## 2019-07-19 ENCOUNTER — Other Ambulatory Visit: Payer: Self-pay

## 2019-07-19 ENCOUNTER — Encounter: Payer: Self-pay | Admitting: Cardiology

## 2019-07-19 VITALS — BP 116/68 | HR 72 | Ht 63.0 in | Wt 161.0 lb

## 2019-07-19 DIAGNOSIS — I442 Atrioventricular block, complete: Secondary | ICD-10-CM

## 2019-07-19 DIAGNOSIS — Z950 Presence of cardiac pacemaker: Secondary | ICD-10-CM

## 2019-07-19 LAB — CUP PACEART INCLINIC DEVICE CHECK
Battery Remaining Longevity: 122 mo
Battery Voltage: 2.99 V
Brady Statistic RA Percent Paced: 31 %
Brady Statistic RV Percent Paced: 99.97 %
Date Time Interrogation Session: 20210712104051
Implantable Lead Implant Date: 20181014
Implantable Lead Implant Date: 20181014
Implantable Lead Location: 753859
Implantable Lead Location: 753860
Implantable Pulse Generator Implant Date: 20181014
Lead Channel Impedance Value: 437.5 Ohm
Lead Channel Impedance Value: 562.5 Ohm
Lead Channel Pacing Threshold Amplitude: 0.5 V
Lead Channel Pacing Threshold Amplitude: 0.875 V
Lead Channel Pacing Threshold Pulse Width: 0.5 ms
Lead Channel Pacing Threshold Pulse Width: 0.5 ms
Lead Channel Sensing Intrinsic Amplitude: 1.8 mV
Lead Channel Setting Pacing Amplitude: 1.125
Lead Channel Setting Pacing Amplitude: 2 V
Lead Channel Setting Pacing Pulse Width: 0.5 ms
Lead Channel Setting Sensing Sensitivity: 4 mV
Pulse Gen Model: 2272
Pulse Gen Serial Number: 8951116

## 2019-07-19 NOTE — Patient Instructions (Signed)
Medication Instructions:  Your physician recommends that you continue on your current medications as directed. Please refer to the Current Medication list given to you today.  *If you need a refill on your cardiac medications before your next appointment, please call your pharmacy*   Lab Work: None ordered If you have labs (blood work) drawn today and your tests are completely normal, you will receive your results only by: Marland Kitchen MyChart Message (if you have MyChart) OR . A paper copy in the mail If you have any lab test that is abnormal or we need to change your treatment, we will call you to review the results.   Testing/Procedures: None ordered   Follow-Up: Remote monitoring is used to monitor your Pacemaker of ICD from home. This monitoring reduces the number of office visits required to check your device to one time per year. It allows [place] to keep an eye on the functioning of your device to ensure it is working properly. You are scheduled for a device check from home on 08/06/19. You may send your transmission at any time that day. If you have a wireless device, the transmission will be sent automatically. After your physician reviews your transmission, you will receive a postcard with your next transmission date.  At [facility], you and your health needs are our priority.  As part of our continuing mission to provide you with exceptional heart care, we have created designated Provider Care Teams.  These Care Teams include your primary Cardiologist (physician) and Advanced Practice Providers (APPs -  Physician Assistants and Nurse Practitioners) who all work together to provide you with the care you need, when you need it.  We recommend signing up for the patient portal called "MyChart".  Sign up information is provided on this After Visit Summary.  MyChart is used to connect with patients for Virtual Visits (Telemedicine).  Patients are able to view lab/test results, encounter notes,  upcoming appointments, etc.  Non-urgent messages can be sent to your provider as well.   To learn more about what you can do with MyChart, go to [email or web address].    Your next appointment:   1 year(s)  The format for your next appointment:   In Person  Provider:   Allegra Lai, MD   Thank you for choosing [facility]!!   Trinidad Curet, RN [phone]    Other Instructions

## 2019-08-06 ENCOUNTER — Ambulatory Visit (INDEPENDENT_AMBULATORY_CARE_PROVIDER_SITE_OTHER): Payer: Medicare Other | Admitting: *Deleted

## 2019-08-06 DIAGNOSIS — I442 Atrioventricular block, complete: Secondary | ICD-10-CM | POA: Diagnosis not present

## 2019-08-10 LAB — CUP PACEART REMOTE DEVICE CHECK
Battery Remaining Longevity: 118 mo
Battery Remaining Percentage: 95.5 %
Battery Voltage: 2.99 V
Brady Statistic AP VP Percent: 42 %
Brady Statistic AP VS Percent: 1 %
Brady Statistic AS VP Percent: 58 %
Brady Statistic AS VS Percent: 1 %
Brady Statistic RA Percent Paced: 40 %
Brady Statistic RV Percent Paced: 99 %
Date Time Interrogation Session: 20210801144854
Implantable Lead Implant Date: 20181014
Implantable Lead Implant Date: 20181014
Implantable Lead Location: 753859
Implantable Lead Location: 753860
Implantable Pulse Generator Implant Date: 20181014
Lead Channel Impedance Value: 400 Ohm
Lead Channel Impedance Value: 530 Ohm
Lead Channel Pacing Threshold Amplitude: 0.5 V
Lead Channel Pacing Threshold Amplitude: 0.875 V
Lead Channel Pacing Threshold Pulse Width: 0.5 ms
Lead Channel Pacing Threshold Pulse Width: 0.5 ms
Lead Channel Sensing Intrinsic Amplitude: 2 mV
Lead Channel Sensing Intrinsic Amplitude: 9.6 mV
Lead Channel Setting Pacing Amplitude: 1.125
Lead Channel Setting Pacing Amplitude: 2 V
Lead Channel Setting Pacing Pulse Width: 0.5 ms
Lead Channel Setting Sensing Sensitivity: 4 mV
Pulse Gen Model: 2272
Pulse Gen Serial Number: 8951116

## 2019-08-13 ENCOUNTER — Telehealth: Payer: Self-pay

## 2019-08-13 NOTE — Progress Notes (Signed)
Remote pacemaker transmission.   

## 2019-08-13 NOTE — Telephone Encounter (Signed)
I let the pt daughter know we got the transmission from the pt monitor. She stated that the pt monitor was in the kitchen. I let her know that the kitchen appliances interferes with the monitor. I told her the monitor has a cell adapter and that takes the place of the phone line. The best place for the monitor is by her bedside.

## 2019-11-05 ENCOUNTER — Ambulatory Visit (INDEPENDENT_AMBULATORY_CARE_PROVIDER_SITE_OTHER): Payer: Medicare Other

## 2019-11-05 DIAGNOSIS — I442 Atrioventricular block, complete: Secondary | ICD-10-CM | POA: Diagnosis not present

## 2019-11-05 LAB — CUP PACEART REMOTE DEVICE CHECK
Battery Remaining Longevity: 119 mo
Battery Remaining Percentage: 95.5 %
Battery Voltage: 2.99 V
Brady Statistic AP VP Percent: 41 %
Brady Statistic AP VS Percent: 1 %
Brady Statistic AS VP Percent: 59 %
Brady Statistic AS VS Percent: 1 %
Brady Statistic RA Percent Paced: 38 %
Brady Statistic RV Percent Paced: 99 %
Date Time Interrogation Session: 20211029020020
Implantable Lead Implant Date: 20181014
Implantable Lead Implant Date: 20181014
Implantable Lead Location: 753859
Implantable Lead Location: 753860
Implantable Pulse Generator Implant Date: 20181014
Lead Channel Impedance Value: 440 Ohm
Lead Channel Impedance Value: 540 Ohm
Lead Channel Pacing Threshold Amplitude: 0.5 V
Lead Channel Pacing Threshold Amplitude: 0.75 V
Lead Channel Pacing Threshold Pulse Width: 0.5 ms
Lead Channel Pacing Threshold Pulse Width: 0.5 ms
Lead Channel Sensing Intrinsic Amplitude: 10.1 mV
Lead Channel Sensing Intrinsic Amplitude: 2.2 mV
Lead Channel Setting Pacing Amplitude: 1 V
Lead Channel Setting Pacing Amplitude: 2 V
Lead Channel Setting Pacing Pulse Width: 0.5 ms
Lead Channel Setting Sensing Sensitivity: 4 mV
Pulse Gen Model: 2272
Pulse Gen Serial Number: 8951116

## 2019-11-09 NOTE — Progress Notes (Signed)
Remote pacemaker transmission.   

## 2020-01-03 DIAGNOSIS — R269 Unspecified abnormalities of gait and mobility: Secondary | ICD-10-CM | POA: Insufficient documentation

## 2020-01-03 HISTORY — DX: Unspecified abnormalities of gait and mobility: R26.9

## 2020-02-04 ENCOUNTER — Ambulatory Visit (INDEPENDENT_AMBULATORY_CARE_PROVIDER_SITE_OTHER): Payer: Medicare Other

## 2020-02-04 DIAGNOSIS — I442 Atrioventricular block, complete: Secondary | ICD-10-CM | POA: Diagnosis not present

## 2020-02-04 LAB — CUP PACEART REMOTE DEVICE CHECK
Battery Remaining Longevity: 119 mo
Battery Remaining Percentage: 95.5 %
Battery Voltage: 2.99 V
Brady Statistic AP VP Percent: 45 %
Brady Statistic AP VS Percent: 1 %
Brady Statistic AS VP Percent: 55 %
Brady Statistic AS VS Percent: 1 %
Brady Statistic RA Percent Paced: 42 %
Brady Statistic RV Percent Paced: 99 %
Date Time Interrogation Session: 20220128020014
Implantable Lead Implant Date: 20181014
Implantable Lead Implant Date: 20181014
Implantable Lead Location: 753859
Implantable Lead Location: 753860
Implantable Pulse Generator Implant Date: 20181014
Lead Channel Impedance Value: 440 Ohm
Lead Channel Impedance Value: 540 Ohm
Lead Channel Pacing Threshold Amplitude: 0.5 V
Lead Channel Pacing Threshold Amplitude: 0.875 V
Lead Channel Pacing Threshold Pulse Width: 0.5 ms
Lead Channel Pacing Threshold Pulse Width: 0.5 ms
Lead Channel Sensing Intrinsic Amplitude: 10.1 mV
Lead Channel Sensing Intrinsic Amplitude: 3.3 mV
Lead Channel Setting Pacing Amplitude: 1.125
Lead Channel Setting Pacing Amplitude: 2 V
Lead Channel Setting Pacing Pulse Width: 0.5 ms
Lead Channel Setting Sensing Sensitivity: 4 mV
Pulse Gen Model: 2272
Pulse Gen Serial Number: 8951116

## 2020-02-08 DIAGNOSIS — U071 COVID-19: Secondary | ICD-10-CM

## 2020-02-08 HISTORY — DX: COVID-19: U07.1

## 2020-02-12 NOTE — Progress Notes (Signed)
Remote pacemaker transmission.   

## 2020-04-11 ENCOUNTER — Encounter: Payer: Self-pay | Admitting: Cardiology

## 2020-04-11 ENCOUNTER — Other Ambulatory Visit: Payer: Self-pay

## 2020-04-11 ENCOUNTER — Ambulatory Visit (INDEPENDENT_AMBULATORY_CARE_PROVIDER_SITE_OTHER): Payer: Medicare Other | Admitting: Cardiology

## 2020-04-11 VITALS — BP 126/78 | HR 72 | Ht 62.0 in | Wt 155.0 lb

## 2020-04-11 DIAGNOSIS — Z952 Presence of prosthetic heart valve: Secondary | ICD-10-CM

## 2020-04-11 DIAGNOSIS — Z950 Presence of cardiac pacemaker: Secondary | ICD-10-CM

## 2020-04-11 DIAGNOSIS — E785 Hyperlipidemia, unspecified: Secondary | ICD-10-CM | POA: Diagnosis not present

## 2020-04-11 DIAGNOSIS — I10 Essential (primary) hypertension: Secondary | ICD-10-CM | POA: Diagnosis not present

## 2020-04-11 NOTE — Progress Notes (Signed)
Cardiology Office Note:    Date:  04/11/2020   ID:  Robin Sutton, DOB Dec 05, 1935, MRN [id number]  PCP:  Maris Berger, MD  Cardiologist:  Jenne Campus, MD    Referring MD: Maris Berger, MD   Chief Complaint  Patient presents with  . Follow-up    History of Present Illness:    Robin Sutton is a 85 y.o. female with past medical history significant for TAVI done in 201823 mm Medtronic Evolut Pro transcatheter heart valve placed via percutaneous right transfemoral approach , status post dual-chamber pacemaker implantation which is central device: In December 2018, essential hypertension, dyslipidemia. She comes today to my office for follow-up overall she is doing well the biggest problem is issue with the balance.  There is no dizziness no passing out just balance problem.  Described also to have some chest pain that she got but pain was reproducible by pressing chest wall as well as certain motion.  Past Medical History:  Diagnosis Date  . Carotid stenosis, bilateral 04/19/2015   Formatting of this note might be different from the original. 1-39% bilaterally  . CHB (complete heart block) ([place]) 09/30/2018  . CKD (chronic kidney disease), stage III ([place]) 10/02/2018  . COVID 02/2020  . Dyslipidemia 07/09/2016  . Essential hypertension 07/09/2016  . Gait disturbance 01/03/2020  . History of CVA (cerebrovascular accident)   . History of CVA (cerebrovascular accident) without residual deficits 04/19/2015  . HLD (hyperlipidemia)   . Hypertension   . Hypertensive heart disease without heart failure 09/30/2018  . Hypertensive heart/kidney disease without HF and with CKD stage III ([place]) 10/02/2018  . Memory problem 02/12/2019  . S/P placement of cardiac pacemaker    a. developed CHB s/p TAVR and got a St. Jude Medical Assurity MRI compatible dual chamber [id number] (serial number  [id number])  . S/P TAVR (transcatheter aortic valve replacement)    a. 10/2016: 23 mm Medtronic Evolut Pro  transcatheter heart valve placed via percutaneous right transfemoral approach   . Severe aortic stenosis 07/09/2016   Moderate based on echo from May 2017 Severe by echo from July 2018 mean gradient of 47  Formatting of this note might be different from the original. Moderate based on echo from May 2017 Severe by echo from July 2018 mean gradient of 47    Past Surgical History:  Procedure Laterality Date  . BREAST BIOPSY    . PACEMAKER IMPLANT N/A 10/20/2016   St Jude Medical Assurity MRI conditional  dual-chamber pacemaker for symptomatic complete heart block by Dr Rayann Heman  . RIGHT/LEFT HEART CATH AND CORONARY ANGIOGRAPHY N/A 09/11/2016   Procedure: RIGHT/LEFT HEART CATH AND CORONARY ANGIOGRAPHY;  Surgeon: Sherren Mocha, MD;  Location: [place];  Service: Cardiovascular;  Laterality: N/A;  . TEE WITHOUT CARDIOVERSION N/A 10/17/2016   Procedure: TRANSESOPHAGEAL ECHOCARDIOGRAM (TEE);  Surgeon: Sherren Mocha, MD;  Location: [facility];  Service: Open Heart Surgery;  Laterality: N/A;  . TONSILLECTOMY    . TRANSCATHETER AORTIC VALVE REPLACEMENT, TRANSFEMORAL N/A 10/17/2016   Procedure: TRANSCATHETER AORTIC VALVE REPLACEMENT, TRANSFEMORAL;  Surgeon: Sherren Mocha, MD;  Location: [facility];  Service: Open Heart Surgery;  Laterality: N/A;    Current Medications: Current Meds  Medication Sig  . aspirin EC 81 MG EC tablet Take 1 tablet (81 mg total) by mouth daily.  . Coenzyme Q10 200 MG capsule Take 200 mg by mouth daily.  Marland Kitchen donepezil (ARICEPT) 5 MG tablet Take 5 mg by mouth at bedtime.  Marland Kitchen escitalopram (LEXAPRO)  5 MG tablet Take 5 mg by mouth daily.  Marland Kitchen lisinopril (PRINIVIL,ZESTRIL) 10 MG tablet Take 10 mg by mouth every evening.   . nitroGLYCERIN (NITROSTAT) 0.4 MG SL tablet Place 1 tablet (0.4 mg total) under the tongue every 5 (five) minutes as needed for chest pain.  . rosuvastatin (CRESTOR) 20 MG tablet Take 20 mg by mouth daily.  . Vitamin D, Ergocalciferol, (DRISDOL) 50000 units CAPS  capsule Take 50,000 Units by mouth every Monday.   . vitamin E 1000 UNIT capsule Take 1,000 Units by mouth daily.     Allergies:   Adhesive [tape]   Social History   Socioeconomic History  . Marital status: Widowed    Spouse name: Not on file  . Number of children: Not on file  . Years of education: Not on file  . Highest education level: Not on file  Occupational History  . Not on file  Tobacco Use  . Smoking status: Never Smoker  . Smokeless tobacco: Never Used  Vaping Use  . Vaping Use: Never used  Substance and Sexual Activity  . Alcohol use: No  . Drug use: No  . Sexual activity: Not on file  Other Topics Concern  . Not on file  Social History Narrative  . Not on file   Social Determinants of Health   Financial Resource Strain: Not on file  Food Insecurity: Not on file  Transportation Needs: Not on file  Physical Activity: Not on file  Stress: Not on file  Social Connections: Not on file     Family History: The patient's family history includes Diabetes in her maternal grandfather; Heart disease in her brother, maternal grandfather, and mother; Hypertension in her brother and mother; Kidney failure in her father; Stroke in her mother. ROS:   Please see the history of present illness.    All 14 point review of systems negative except as described per history of present illness  EKGs/Labs/Other Studies Reviewed:      Recent Labs: No results found for requested labs within last 8760 hours.  Recent Lipid Panel No results found for: CHOL, TRIG, HDL, CHOLHDL, VLDL, LDLCALC, LDLDIRECT  Physical Exam:    VS:  BP 126/78 (BP Location: Left Arm, Patient Position: Sitting)   Pulse 72   Ht 5\' 2"  (1.575 m)   Wt 155 lb (70.3 kg)   SpO2 95%   BMI 28.35 kg/m     Wt Readings from Last 3 Encounters:  04/11/20 155 lb (70.3 kg)  07/19/19 161 lb (73 kg)  05/11/19 162 lb (73.5 kg)     GEN:  Well nourished, well developed in no acute distress HEENT: Normal NECK:  No JVD; No carotid bruits LYMPHATICS: No lymphadenopathy CARDIAC: RRR, no murmurs, no rubs, no gallops RESPIRATORY:  Clear to auscultation without rales, wheezing or rhonchi  ABDOMEN: Soft, non-tender, non-distended MUSCULOSKELETAL:  No edema; No deformity  SKIN: Warm and dry LOWER EXTREMITIES: no swelling NEUROLOGIC:  Alert and oriented x 3 PSYCHIATRIC:  Normal affect   ASSESSMENT:    1. S/P TAVR (transcatheter aortic valve replacement)   2. S/P placement of cardiac pacemaker   3. Essential hypertension   4. Dyslipidemia    PLAN:    In order of problems listed above:  1. Status post TAVI.  We will schedule her to have echocardiogram done in the summer. 2. Status post pacemaker [place] central device I did review interrogation from January 2022 normal function. 3. Essential hypertension, blood pressure seems to be well  controlled continue present management. 4. Dyslipidemia last fasting lipid profile-is done by her primary care physician in August of last year which show LDL of 96 and HDL 51.  We will make arrangements for another fasting lipid profile to be done.  She is taking high intense statin form of Crestor 20 mg daily.   Medication Adjustments/Labs and Tests Ordered: Current medicines are reviewed at length with the patient today.  Concerns regarding medicines are outlined above.  No orders of the defined types were placed in this encounter.  Medication changes: No orders of the defined types were placed in this encounter.   Signed, Park Liter, MD, [facility] 04/11/2020 11:38 AM    Reader

## 2020-04-11 NOTE — Patient Instructions (Signed)
Medication Instructions:  Your physician recommends that you continue on your current medications as directed. Please refer to the Current Medication list given to you today.  *If you need a refill on your cardiac medications before your next appointment, please call your pharmacy*   Lab Work: None. If you have labs (blood work) drawn today and your tests are completely normal, you will receive your results only by: . MyChart Message (if you have MyChart) OR . A paper copy in the mail If you have any lab test that is abnormal or we need to change your treatment, we will call you to review the results.   Testing/Procedures: Your physician has requested that you have an echocardiogram. Echocardiography is a painless test that uses sound waves to create images of your heart. It provides your doctor with information about the size and shape of your heart and how well your heart's chambers and valves are working. This procedure takes approximately one hour. There are no restrictions for this procedure.     Follow-Up: At [facility], you and your health needs are our priority.  As part of our continuing mission to provide you with exceptional heart care, we have created designated Provider Care Teams.  These Care Teams include your primary Cardiologist (physician) and Advanced Practice Providers (APPs -  Physician Assistants and Nurse Practitioners) who all work together to provide you with the care you need, when you need it.  We recommend signing up for the patient portal called "MyChart".  Sign up information is provided on this After Visit Summary.  MyChart is used to connect with patients for Virtual Visits (Telemedicine).  Patients are able to view lab/test results, encounter notes, upcoming appointments, etc.  Non-urgent messages can be sent to your provider as well.   To learn more about what you can do with MyChart, go to [email or web address].    Your next appointment:   6  month(s)  The format for your next appointment:   In Person  Provider:   Robert Krasowski, MD   Other Instructions   Echocardiogram An echocardiogram is a test that uses sound waves (ultrasound) to produce images of the heart. Images from an echocardiogram can provide important information about:  Heart size and shape.  The size and thickness and movement of your heart's walls.  Heart muscle function and strength.  Heart valve function or if you have stenosis. Stenosis is when the heart valves are too narrow.  If blood is flowing backward through the heart valves (regurgitation).  A tumor or infectious growth around the heart valves.  Areas of heart muscle that are not working well because of poor blood flow or injury from a heart attack.  Aneurysm detection. An aneurysm is a weak or damaged part of an artery wall. The wall bulges out from the normal force of blood pumping through the body. Tell a health care provider about:  Any allergies you have.  All medicines you are taking, including vitamins, herbs, eye drops, creams, and over-the-counter medicines.  Any blood disorders you have.  Any surgeries you have had.  Any medical conditions you have.  Whether you are pregnant or may be pregnant. What are the risks? Generally, this is a safe test. However, problems may occur, including an allergic reaction to dye (contrast) that may be used during the test. What happens before the test? No specific preparation is needed. You may eat and drink normally. What happens during the test?  You will take off your   clothes from the waist up and put on a hospital gown.  Electrodes or electrocardiogram (ECG)patches may be placed on your chest. The electrodes or patches are then connected to a device that monitors your heart rate and rhythm.  You will lie down on a table for an ultrasound exam. A gel will be applied to your chest to help sound waves pass through your skin.  A  handheld device, called a transducer, will be pressed against your chest and moved over your heart. The transducer produces sound waves that travel to your heart and bounce back (or "echo" back) to the transducer. These sound waves will be captured in real-time and changed into images of your heart that can be viewed on a video monitor. The images will be recorded on a computer and reviewed by your health care provider.  You may be asked to change positions or hold your breath for a short time. This makes it easier to get different views or better views of your heart.  In some cases, you may receive contrast through an IV in one of your veins. This can improve the quality of the pictures from your heart. The procedure may vary among health care providers and hospitals.   What can I expect after the test? You may return to your normal, everyday life, including diet, activities, and medicines, unless your health care provider tells you not to do that. Follow these instructions at home:  It is up to you to get the results of your test. Ask your health care provider, or the department that is doing the test, when your results will be ready.  Keep all follow-up visits. This is important. Summary  An echocardiogram is a test that uses sound waves (ultrasound) to produce images of the heart.  Images from an echocardiogram can provide important information about the size and shape of your heart, heart muscle function, heart valve function, and other possible heart problems.  You do not need to do anything to prepare before this test. You may eat and drink normally.  After the echocardiogram is completed, you may return to your normal, everyday life, unless your health care provider tells you not to do that. This information is not intended to replace advice given to you by your health care provider. Make sure you discuss any questions you have with your health care provider. Document Revised:  08/17/2019 Document Reviewed: 08/17/2019 Elsevier Patient Education  2021 Elsevier Inc.   

## 2020-05-05 ENCOUNTER — Ambulatory Visit (INDEPENDENT_AMBULATORY_CARE_PROVIDER_SITE_OTHER): Payer: Medicare Other

## 2020-05-05 DIAGNOSIS — I442 Atrioventricular block, complete: Secondary | ICD-10-CM

## 2020-05-05 LAB — CUP PACEART REMOTE DEVICE CHECK
Battery Remaining Longevity: 119 mo
Battery Remaining Percentage: 95.5 %
Battery Voltage: 2.99 V
Brady Statistic AP VP Percent: 46 %
Brady Statistic AP VS Percent: 1 %
Brady Statistic AS VP Percent: 54 %
Brady Statistic AS VS Percent: 1 %
Brady Statistic RA Percent Paced: 43 %
Brady Statistic RV Percent Paced: 99 %
Date Time Interrogation Session: 20220429045750
Implantable Lead Implant Date: 20181014
Implantable Lead Implant Date: 20181014
Implantable Lead Location: 753859
Implantable Lead Location: 753860
Implantable Pulse Generator Implant Date: 20181014
Lead Channel Impedance Value: 440 Ohm
Lead Channel Impedance Value: 540 Ohm
Lead Channel Pacing Threshold Amplitude: 0.5 V
Lead Channel Pacing Threshold Amplitude: 0.75 V
Lead Channel Pacing Threshold Pulse Width: 0.5 ms
Lead Channel Pacing Threshold Pulse Width: 0.5 ms
Lead Channel Sensing Intrinsic Amplitude: 10.1 mV
Lead Channel Sensing Intrinsic Amplitude: 5 mV
Lead Channel Setting Pacing Amplitude: 1 V
Lead Channel Setting Pacing Amplitude: 2 V
Lead Channel Setting Pacing Pulse Width: 0.5 ms
Lead Channel Setting Sensing Sensitivity: 4 mV
Pulse Gen Model: 2272
Pulse Gen Serial Number: 8951116

## 2020-05-22 ENCOUNTER — Ambulatory Visit (INDEPENDENT_AMBULATORY_CARE_PROVIDER_SITE_OTHER): Payer: Medicare Other

## 2020-05-22 ENCOUNTER — Other Ambulatory Visit: Payer: Self-pay

## 2020-05-22 DIAGNOSIS — I10 Essential (primary) hypertension: Secondary | ICD-10-CM | POA: Diagnosis not present

## 2020-05-22 DIAGNOSIS — Z952 Presence of prosthetic heart valve: Secondary | ICD-10-CM | POA: Diagnosis not present

## 2020-05-22 DIAGNOSIS — Z950 Presence of cardiac pacemaker: Secondary | ICD-10-CM

## 2020-05-22 DIAGNOSIS — E785 Hyperlipidemia, unspecified: Secondary | ICD-10-CM | POA: Diagnosis not present

## 2020-05-22 LAB — ECHOCARDIOGRAM COMPLETE
AR max vel: 1.18 cm2
AV Area VTI: 1.15 cm2
AV Area mean vel: 1.25 cm2
AV Mean grad: 16 mmHg
AV Peak grad: 30.7 mmHg
Ao pk vel: 2.77 m/s
S' Lateral: 3.4 cm

## 2020-05-25 NOTE — Progress Notes (Signed)
Remote pacemaker transmission.   

## 2020-05-31 ENCOUNTER — Telehealth: Payer: Self-pay | Admitting: Cardiology

## 2020-05-31 NOTE — Telephone Encounter (Signed)
Tried to call back no answer. Will continue efforts.

## 2020-05-31 NOTE — Telephone Encounter (Signed)
Follow Up:    Daughter is returning Hayley's call.

## 2020-06-01 NOTE — Telephone Encounter (Signed)
Spoke to patient daughter per dpr. Informed her of results.

## 2020-07-03 DIAGNOSIS — G309 Alzheimer's disease, unspecified: Secondary | ICD-10-CM | POA: Insufficient documentation

## 2020-07-17 ENCOUNTER — Encounter: Payer: Self-pay | Admitting: Cardiology

## 2020-07-17 ENCOUNTER — Other Ambulatory Visit: Payer: Self-pay

## 2020-07-17 ENCOUNTER — Ambulatory Visit (INDEPENDENT_AMBULATORY_CARE_PROVIDER_SITE_OTHER): Payer: Medicare Other | Admitting: Cardiology

## 2020-07-17 VITALS — BP 114/76 | HR 86 | Ht 62.0 in | Wt 157.8 lb

## 2020-07-17 DIAGNOSIS — I442 Atrioventricular block, complete: Secondary | ICD-10-CM

## 2020-07-17 MED ORDER — NITROGLYCERIN 0.4 MG SL SUBL: 0.4000 mg | SUBLINGUAL_TABLET | SUBLINGUAL | 3 refills | Status: AC | PRN

## 2020-07-17 NOTE — Addendum Note (Signed)
Addended by: Stanton Kidney on: 07/17/2020 05:26 PM   Modules accepted: Orders

## 2020-07-17 NOTE — Progress Notes (Signed)
Electrophysiology Office Note   Date:  07/17/2020   ID:  Nyella Eckels, DOB 02-25-1935, MRN [id number]  PCP:  Maris Berger, MD  Cardiologist:  Agustin Cree Primary Electrophysiologist:  Akeel Reffner Meredith Leeds, MD    Chief Complaint: Pacemaker   History of Present Illness: Robin Sutton is a 85 y.o. female who is being seen today for the evaluation of pacemaker at the request of Jenne Campus. Presenting today for electrophysiology evaluation.  She has a history significant for hypertension, hyperlipidemia, aortic stenosis status post TAVR in 2018, and CVA.  She is now status post [place] dual-chamber pacemaker implanted December 2018 for complete heart block.  Today, denies symptoms of palpitations, chest pain, shortness of breath, orthopnea, PND, lower extremity edema, claudication, dizziness, presyncope, syncope, bleeding, or neurologic sequela. The patient is tolerating medications without difficulties.  She is currently feeling well.  She has no chest pain or shortness of breath.  She is able do all her daily activities without restriction.  She continues to be active.   Past Medical History:  Diagnosis Date   Carotid stenosis, bilateral 04/19/2015   Formatting of this note might be different from the original. 1-39% bilaterally   CHB (complete heart block) ([place]) 09/30/2018   CKD (chronic kidney disease), stage III ([place]) 10/02/2018   COVID 02/2020   Dyslipidemia 07/09/2016   Essential hypertension 07/09/2016   Gait disturbance 01/03/2020   History of CVA (cerebrovascular accident)    History of CVA (cerebrovascular accident) without residual deficits 04/19/2015   HLD (hyperlipidemia)    Hypertension    Hypertensive heart disease without heart failure 09/30/2018   Hypertensive heart/kidney disease without HF and with CKD stage III ([place]) 10/02/2018   Memory problem 02/12/2019   S/P placement of cardiac pacemaker    a. developed CHB s/p TAVR and got a St. Jude Medical Assurity MRI  compatible dual chamber [id number] (serial number  [id number])   S/P TAVR (transcatheter aortic valve replacement)    a. 10/2016: 23 mm Medtronic Evolut Pro transcatheter heart valve placed via percutaneous right transfemoral approach    Severe aortic stenosis 07/09/2016   Moderate based on echo from May 2017 Severe by echo from July 2018 mean gradient of 47  Formatting of this note might be different from the original. Moderate based on echo from May 2017 Severe by echo from July 2018 mean gradient of 47   Past Surgical History:  Procedure Laterality Date   BREAST BIOPSY     PACEMAKER IMPLANT N/A 10/20/2016   St Jude Medical Assurity MRI conditional  dual-chamber pacemaker for symptomatic complete heart block by Dr Rayann Heman   RIGHT/LEFT HEART CATH AND CORONARY ANGIOGRAPHY N/A 09/11/2016   Procedure: RIGHT/LEFT HEART CATH AND CORONARY ANGIOGRAPHY;  Surgeon: Sherren Mocha, MD;  Location: [facility];  Service: Cardiovascular;  Laterality: N/A;   TEE WITHOUT CARDIOVERSION N/A 10/17/2016   Procedure: TRANSESOPHAGEAL ECHOCARDIOGRAM (TEE);  Surgeon: Sherren Mocha, MD;  Location: [facility];  Service: Open Heart Surgery;  Laterality: N/A;   TONSILLECTOMY     TRANSCATHETER AORTIC VALVE REPLACEMENT, TRANSFEMORAL N/A 10/17/2016   Procedure: TRANSCATHETER AORTIC VALVE REPLACEMENT, TRANSFEMORAL;  Surgeon: Sherren Mocha, MD;  Location: [facility];  Service: Open Heart Surgery;  Laterality: N/A;     Current Outpatient Medications  Medication Sig Dispense Refill   aspirin EC 81 MG EC tablet Take 1 tablet (81 mg total) by mouth daily.     Coenzyme Q10 200 MG capsule Take 200 mg by mouth daily.  donepezil (ARICEPT) 5 MG tablet Take 5 mg by mouth at bedtime.     escitalopram (LEXAPRO) 5 MG tablet Take 5 mg by mouth daily.     lisinopril (PRINIVIL,ZESTRIL) 10 MG tablet Take 10 mg by mouth every evening.   1   nitroGLYCERIN (NITROSTAT) 0.4 MG SL tablet Place 1 tablet (0.4 mg total) under the tongue every 5 (five)  minutes as needed for chest pain. 25 tablet 3   rosuvastatin (CRESTOR) 20 MG tablet Take 20 mg by mouth daily.     Vitamin D, Ergocalciferol, (DRISDOL) 50000 units CAPS capsule Take 50,000 Units by mouth every Monday.   1   vitamin E 1000 UNIT capsule Take 1,000 Units by mouth daily.     No current facility-administered medications for this visit.    Allergies:   Adhesive [tape]   Social History:  The patient  reports that she has never smoked. She has never used smokeless tobacco. She reports that she does not drink alcohol and does not use drugs.   Family History:  The patient's family history includes Diabetes in her maternal grandfather; Heart disease in her brother, maternal grandfather, and mother; Hypertension in her brother and mother; Kidney failure in her father; Stroke in her mother.   ROS:  Please see the history of present illness.   Otherwise, review of systems is positive for none.   All other systems are reviewed and negative.   PHYSICAL EXAM: VS:  BP 114/76   Pulse 86   Ht 5\' 2"  (1.575 m)   Wt 157 lb 12.8 oz (71.6 kg)   SpO2 95%   BMI 28.86 kg/m  , BMI Body mass index is 28.86 kg/m. GEN: Well nourished, well developed, in no acute distress  HEENT: normal  Neck: no JVD, carotid bruits, or masses Cardiac: RRR; no murmurs, rubs, or gallops,no edema  Respiratory:  clear to auscultation bilaterally, normal work of breathing GI: soft, nontender, nondistended, + BS MS: no deformity or atrophy  Skin: warm and dry, device site well healed Neuro:  Strength and sensation are intact Psych: euthymic mood, full affect  EKG:  EKG is not ordered today. Personal review of the ekg ordered 04/11/20 shows atrial sensed, ventricular paced, rate 75  Personal review of the device interrogation today. Results in [place]: No results found for requested labs within last 8760 hours.    Lipid Panel  No results found for: CHOL, TRIG, HDL, CHOLHDL, VLDL, LDLCALC,  LDLDIRECT   Wt Readings from Last 3 Encounters:  07/17/20 157 lb 12.8 oz (71.6 kg)  04/11/20 155 lb (70.3 kg)  07/19/19 161 lb (73 kg)      Other studies Reviewed: Additional studies/ records that were reviewed today include: TTE 05/28/19  Review of the above records today demonstrates:   1. Left ventricular ejection fraction, by estimation, is 55 to 60%. The  left ventricle has normal function. The left ventricle has no regional  wall motion abnormalities. Left ventricular diastolic parameters are  consistent with Grade I diastolic  dysfunction (impaired relaxation).   2. Mild to moderate mitral valve regurgitation.   3. TAVR not well visualized. Appears to have satisfactory function. Mean  and max gradient 17 and 27 mm Hg respectively.. The aortic valve was not  well visualized. There is a 23 mm Medtronic, stented (TAVR) valve present  in the aortic position.   ASSESSMENT AND PLAN:  1.  Complete heart block: Status post [facility] Jude dual-chamber pacemaker.  Device functioning properly.  No changes at this time.  2.  Aortic stenosis: Status post TAVR.  Plan per primary cardiology.  3.  Hypertension: Currently well controlled  Current medicines are reviewed at length with the patient today.   The patient does not have concerns regarding her medicines.  The following changes were made today: None  Labs/ tests ordered today include:  No orders of the defined types were placed in this encounter.    Disposition:   FU with Faron Whitelock 1 year  Signed, Marlow Berenguer Meredith Leeds, MD  07/17/2020 4:27 PM     [address] [place] [address] [phone] (office) [phone] (fax)

## 2020-08-04 ENCOUNTER — Ambulatory Visit (INDEPENDENT_AMBULATORY_CARE_PROVIDER_SITE_OTHER): Payer: Medicare Other

## 2020-08-04 DIAGNOSIS — I442 Atrioventricular block, complete: Secondary | ICD-10-CM | POA: Diagnosis not present

## 2020-08-05 LAB — CUP PACEART REMOTE DEVICE CHECK
Battery Remaining Longevity: 68 mo
Battery Remaining Percentage: 59 %
Battery Voltage: 2.99 V
Brady Statistic AP VP Percent: 38 %
Brady Statistic AP VS Percent: 1 %
Brady Statistic AS VP Percent: 62 %
Brady Statistic AS VS Percent: 1 %
Brady Statistic RA Percent Paced: 37 %
Brady Statistic RV Percent Paced: 99 %
Date Time Interrogation Session: 20220729020015
Implantable Lead Implant Date: 20181014
Implantable Lead Implant Date: 20181014
Implantable Lead Location: 753859
Implantable Lead Location: 753860
Implantable Pulse Generator Implant Date: 20181014
Lead Channel Impedance Value: 440 Ohm
Lead Channel Impedance Value: 510 Ohm
Lead Channel Pacing Threshold Amplitude: 0.5 V
Lead Channel Pacing Threshold Amplitude: 0.875 V
Lead Channel Pacing Threshold Pulse Width: 0.5 ms
Lead Channel Pacing Threshold Pulse Width: 0.5 ms
Lead Channel Sensing Intrinsic Amplitude: 1.6 mV
Lead Channel Sensing Intrinsic Amplitude: 12 mV
Lead Channel Setting Pacing Amplitude: 1.125
Lead Channel Setting Pacing Amplitude: 2 V
Lead Channel Setting Pacing Pulse Width: 0.5 ms
Lead Channel Setting Sensing Sensitivity: 4 mV
Pulse Gen Model: 2272
Pulse Gen Serial Number: 8951116

## 2020-08-30 NOTE — Progress Notes (Signed)
Remote pacemaker transmission.   

## 2020-10-23 DIAGNOSIS — Z8616 Personal history of COVID-19: Secondary | ICD-10-CM | POA: Insufficient documentation

## 2020-10-26 ENCOUNTER — Other Ambulatory Visit: Payer: Self-pay

## 2020-10-26 ENCOUNTER — Ambulatory Visit (INDEPENDENT_AMBULATORY_CARE_PROVIDER_SITE_OTHER): Payer: Medicare Other | Admitting: Cardiology

## 2020-10-26 ENCOUNTER — Encounter: Payer: Self-pay | Admitting: Cardiology

## 2020-10-26 VITALS — BP 90/60 | HR 58 | Ht 62.0 in | Wt 156.0 lb

## 2020-10-26 DIAGNOSIS — I10 Essential (primary) hypertension: Secondary | ICD-10-CM | POA: Diagnosis not present

## 2020-10-26 DIAGNOSIS — Z952 Presence of prosthetic heart valve: Secondary | ICD-10-CM | POA: Diagnosis not present

## 2020-10-26 DIAGNOSIS — Z950 Presence of cardiac pacemaker: Secondary | ICD-10-CM | POA: Diagnosis not present

## 2020-10-26 DIAGNOSIS — I442 Atrioventricular block, complete: Secondary | ICD-10-CM

## 2020-10-26 MED ORDER — LISINOPRIL 5 MG PO TABS: 5.0000 mg | ORAL_TABLET | Freq: Every evening | ORAL | 3 refills | Status: DC

## 2020-10-26 NOTE — Patient Instructions (Signed)
Medication Instructions:  Your physician has recommended you make the following change in your medication:   Decrease Lisinopril to 5 mg daily. Take 1/2 tablet of current dose.  *If you need a refill on your cardiac medications before your next appointment, please call your pharmacy*   Lab Work: None ordered If you have labs (blood work) drawn today and your tests are completely normal, you will receive your results only by: [place] (if you have MyChart) OR A paper copy in the mail If you have any lab test that is abnormal or we need to change your treatment, we will call you to review the results.   Testing/Procedures: None ordered   Follow-Up: At [facility], you and your health needs are our priority.  As part of our continuing mission to provide you with exceptional heart care, we have created designated Provider Care Teams.  These Care Teams include your primary Cardiologist (physician) and Advanced Practice Providers (APPs -  Physician Assistants and Nurse Practitioners) who all work together to provide you with the care you need, when you need it.  We recommend signing up for the patient portal called "MyChart".  Sign up information is provided on this After Visit Summary.  MyChart is used to connect with patients for Virtual Visits (Telemedicine).  Patients are able to view lab/test results, encounter notes, upcoming appointments, etc.  Non-urgent messages can be sent to your provider as well.   To learn more about what you can do with MyChart, go to [email or web address].    Your next appointment:   6 month(s)  The format for your next appointment:   In Person  Provider:   Jenne Campus, MD   Other Instructions NA

## 2020-10-26 NOTE — Progress Notes (Signed)
Cardiology Office Note:    Date:  10/26/2020   ID:  Robin Sutton, DOB [birth year], MRN [id number]  PCP:  Maris Berger, MD  Cardiologist:  Jenne Campus, MD    Referring MD: Maris Berger, MD   Chief Complaint  Patient presents with   Follow-up  I am doing fine  History of Present Illness:    Robin Sutton is a 85 y.o. female with past medical history significant for severe arctic stenosis that required intervention.  In 2019 she did have TAVI done with 23 mm Medtronic evolute pro valve inserted via right femoral approach, also have complete heart block that required dual-chamber pacemaker implantation.  Also essential hypertension as well as dyslipidemia. She comes today 2 months for follow-up overall she is cheerful and happy like always denies have any chest pain tightness squeezing pressure burning chest.  She described to have some weakness fatigue as well as some dizziness.  Oral she spent majority of time sitting in the chair.  When she moves around her daughter encouraged her to use a walker however she does not like it.  Past Medical History:  Diagnosis Date   Carotid stenosis, bilateral 04/19/2015   Formatting of this note might be different from the original. 1-39% bilaterally   CHB (complete heart block) ([place]) 09/30/2018   CKD (chronic kidney disease), stage III ([place]) 10/02/2018   COVID 02/2020   Dyslipidemia 07/09/2016   Essential hypertension 07/09/2016   Gait disturbance 01/03/2020   History of CVA (cerebrovascular accident)    History of CVA (cerebrovascular accident) without residual deficits 04/19/2015   HLD (hyperlipidemia)    Hypertension    Hypertensive heart disease without heart failure 09/30/2018   Hypertensive heart/kidney disease without HF and with CKD stage III ([place]) 10/02/2018   Memory problem 02/12/2019   Mixed hyperlipidemia 04/19/2015   Pacemaker 12/03/2016   St Jude Nov 2018   S/P placement of cardiac pacemaker    a. developed CHB s/p TAVR and  got a St. Jude Medical Assurity MRI compatible dual chamber [id number] (serial number  [id number])   S/P TAVR (transcatheter aortic valve replacement)    a. 10/2016: 23 mm Medtronic Evolut Pro transcatheter heart valve placed via percutaneous right transfemoral approach    Severe aortic stenosis 07/09/2016   Moderate based on echo from May 2017 Severe by echo from July 2018 mean gradient of 47  Formatting of this note might be different from the original. Moderate based on echo from May 2017 Severe by echo from July 2018 mean gradient of 47    Past Surgical History:  Procedure Laterality Date   BREAST BIOPSY     PACEMAKER IMPLANT N/A 10/20/2016   St Jude Medical Assurity MRI conditional  dual-chamber pacemaker for symptomatic complete heart block by Dr Rayann Heman   RIGHT/LEFT HEART CATH AND CORONARY ANGIOGRAPHY N/A 09/11/2016   Procedure: RIGHT/LEFT HEART CATH AND CORONARY ANGIOGRAPHY;  Surgeon: Sherren Mocha, MD;  Location: [facility];  Service: Cardiovascular;  Laterality: N/A;   TEE WITHOUT CARDIOVERSION N/A 10/17/2016   Procedure: TRANSESOPHAGEAL ECHOCARDIOGRAM (TEE);  Surgeon: Sherren Mocha, MD;  Location: [facility];  Service: Open Heart Surgery;  Laterality: N/A;   TONSILLECTOMY     TRANSCATHETER AORTIC VALVE REPLACEMENT, TRANSFEMORAL N/A 10/17/2016   Procedure: TRANSCATHETER AORTIC VALVE REPLACEMENT, TRANSFEMORAL;  Surgeon: Sherren Mocha, MD;  Location: [facility];  Service: Open Heart Surgery;  Laterality: N/A;    Current Medications: Current Meds  Medication Sig   aspirin EC 81 MG EC  tablet Take 1 tablet (81 mg total) by mouth daily.   Coenzyme Q10 200 MG capsule Take 200 mg by mouth daily.   donepezil (ARICEPT) 5 MG tablet Take 5 mg by mouth at bedtime.   escitalopram (LEXAPRO) 5 MG tablet Take 5 mg by mouth daily.   nitroGLYCERIN (NITROSTAT) 0.4 MG SL tablet Place 1 tablet (0.4 mg total) under the tongue every 5 (five) minutes as needed for chest pain.   rosuvastatin (CRESTOR) 20 MG  tablet Take 20 mg by mouth daily.   Vitamin D, Ergocalciferol, (DRISDOL) 50000 units CAPS capsule Take 50,000 Units by mouth every Monday.    vitamin E 1000 UNIT capsule Take 1,000 Units by mouth daily.   [DISCONTINUED] lisinopril (PRINIVIL,ZESTRIL) 10 MG tablet Take 10 mg by mouth every evening.      Allergies:   Adhesive [tape]   Social History   Socioeconomic History   Marital status: Widowed    Spouse name: Not on file   Number of children: Not on file   Years of education: Not on file   Highest education level: Not on file  Occupational History   Not on file  Tobacco Use   Smoking status: Never   Smokeless tobacco: Never  Vaping Use   Vaping Use: Never used  Substance and Sexual Activity   Alcohol use: No   Drug use: No   Sexual activity: Not on file  Other Topics Concern   Not on file  Social History Narrative   Not on file   Social Determinants of Health   Financial Resource Strain: Not on file  Food Insecurity: Not on file  Transportation Needs: Not on file  Physical Activity: Not on file  Stress: Not on file  Social Connections: Not on file     Family History: The patient's family history includes Diabetes in her maternal grandfather; Heart disease in her brother, maternal grandfather, and mother; Hypertension in her brother and mother; Kidney failure in her father; Stroke in her mother. ROS:   Please see the history of present illness.    All 14 point review of systems negative except as described per history of present illness  EKGs/Labs/Other Studies Reviewed:      Recent Labs: No results found for requested labs within last 8760 hours.  Recent Lipid Panel No results found for: CHOL, TRIG, HDL, CHOLHDL, VLDL, LDLCALC, LDLDIRECT  Physical Exam:    VS:  BP 90/60 (BP Location: Left Arm, Patient Position: Sitting, Cuff Size: Normal)   Pulse (!) 58   Ht 5\' 2"  (1.575 m)   Wt 156 lb (70.8 kg)   SpO2 98%   BMI 28.53 kg/m     Wt Readings from Last  3 Encounters:  10/26/20 156 lb (70.8 kg)  07/17/20 157 lb 12.8 oz (71.6 kg)  04/11/20 155 lb (70.3 kg)     GEN:  Well nourished, well developed in no acute distress HEENT: Normal NECK: No JVD; No carotid bruits LYMPHATICS: No lymphadenopathy CARDIAC: RRR, systolic ejection murmur grade 1/6 to 2/6 best heard right upper portion of the sternum, no rubs, no gallops RESPIRATORY:  Clear to auscultation without rales, wheezing or rhonchi  ABDOMEN: Soft, non-tender, non-distended MUSCULOSKELETAL:  No edema; No deformity  SKIN: Warm and dry LOWER EXTREMITIES: no swelling NEUROLOGIC:  Alert and oriented x 3 PSYCHIATRIC:  Normal affect   ASSESSMENT:    1. Essential hypertension   2. S/P TAVR (transcatheter aortic valve replacement)   3. CHB (complete heart block) ([facility])  4. S/P placement of cardiac pacemaker    PLAN:    In order of problems listed above:  Essential hypertension I think her blood pressure is low today I will cut down her lisinopril from 10 mg to only 5.  Rest will be continued Status post TAVI in 2018.  I did review her echocardiogram which showed normally functioning valve with a mean gradient of 16 mmHg which is similar to the gradient reported previously. Complete heart block.  She does have a pacemaker which is [employer] device.  I did review interrogation which was done on 08/04/2020.  Normal function longevity expected 5.7 years.  Normal parameters.   Medication Adjustments/Labs and Tests Ordered: Current medicines are reviewed at length with the patient today.  Concerns regarding medicines are outlined above.  No orders of the defined types were placed in this encounter.  Medication changes:  Meds ordered this encounter  Medications   lisinopril (ZESTRIL) 5 MG tablet    Sig: Take 1 tablet (5 mg total) by mouth every evening.    Dispense:  90 tablet    Refill:  3    Signed, Park Liter, MD, [facility] 10/26/2020 9:43 AM    [facility]

## 2020-11-03 ENCOUNTER — Ambulatory Visit (INDEPENDENT_AMBULATORY_CARE_PROVIDER_SITE_OTHER): Payer: Medicare Other

## 2020-11-03 DIAGNOSIS — I442 Atrioventricular block, complete: Secondary | ICD-10-CM

## 2020-11-03 LAB — CUP PACEART REMOTE DEVICE CHECK
Battery Remaining Longevity: 65 mo
Battery Remaining Percentage: 56 %
Battery Voltage: 2.98 V
Brady Statistic AP VP Percent: 42 %
Brady Statistic AP VS Percent: 1 %
Brady Statistic AS VP Percent: 58 %
Brady Statistic AS VS Percent: 1 %
Brady Statistic RA Percent Paced: 41 %
Brady Statistic RV Percent Paced: 99 %
Date Time Interrogation Session: 20221028022749
Implantable Lead Implant Date: 20181014
Implantable Lead Implant Date: 20181014
Implantable Lead Location: 753859
Implantable Lead Location: 753860
Implantable Pulse Generator Implant Date: 20181014
Lead Channel Impedance Value: 450 Ohm
Lead Channel Impedance Value: 510 Ohm
Lead Channel Pacing Threshold Amplitude: 0.5 V
Lead Channel Pacing Threshold Amplitude: 0.875 V
Lead Channel Pacing Threshold Pulse Width: 0.5 ms
Lead Channel Pacing Threshold Pulse Width: 0.5 ms
Lead Channel Sensing Intrinsic Amplitude: 1.8 mV
Lead Channel Sensing Intrinsic Amplitude: 12 mV
Lead Channel Setting Pacing Amplitude: 1.125
Lead Channel Setting Pacing Amplitude: 2 V
Lead Channel Setting Pacing Pulse Width: 0.5 ms
Lead Channel Setting Sensing Sensitivity: 4 mV
Pulse Gen Model: 2272
Pulse Gen Serial Number: 8951116

## 2020-11-13 NOTE — Progress Notes (Signed)
Remote pacemaker transmission.   

## 2021-02-02 ENCOUNTER — Ambulatory Visit (INDEPENDENT_AMBULATORY_CARE_PROVIDER_SITE_OTHER): Payer: Medicare Other

## 2021-02-02 DIAGNOSIS — I442 Atrioventricular block, complete: Secondary | ICD-10-CM | POA: Diagnosis not present

## 2021-02-02 LAB — CUP PACEART REMOTE DEVICE CHECK
Battery Remaining Longevity: 64 mo
Battery Remaining Percentage: 54 %
Battery Voltage: 2.98 V
Brady Statistic AP VP Percent: 44 %
Brady Statistic AP VS Percent: 1 %
Brady Statistic AS VP Percent: 56 %
Brady Statistic AS VS Percent: 1 %
Brady Statistic RA Percent Paced: 42 %
Brady Statistic RV Percent Paced: 99 %
Date Time Interrogation Session: 20230127021133
Implantable Lead Implant Date: 20181014
Implantable Lead Implant Date: 20181014
Implantable Lead Location: 753859
Implantable Lead Location: 753860
Implantable Pulse Generator Implant Date: 20181014
Lead Channel Impedance Value: 460 Ohm
Lead Channel Impedance Value: 540 Ohm
Lead Channel Pacing Threshold Amplitude: 0.5 V
Lead Channel Pacing Threshold Amplitude: 0.75 V
Lead Channel Pacing Threshold Pulse Width: 0.5 ms
Lead Channel Pacing Threshold Pulse Width: 0.5 ms
Lead Channel Sensing Intrinsic Amplitude: 12 mV
Lead Channel Sensing Intrinsic Amplitude: 2.3 mV
Lead Channel Setting Pacing Amplitude: 1 V
Lead Channel Setting Pacing Amplitude: 2 V
Lead Channel Setting Pacing Pulse Width: 0.5 ms
Lead Channel Setting Sensing Sensitivity: 4 mV
Pulse Gen Model: 2272
Pulse Gen Serial Number: 8951116

## 2021-02-12 NOTE — Progress Notes (Signed)
Remote pacemaker transmission.   

## 2021-03-10 DIAGNOSIS — J069 Acute upper respiratory infection, unspecified: Secondary | ICD-10-CM | POA: Diagnosis not present

## 2021-03-19 DIAGNOSIS — F32A Depression, unspecified: Secondary | ICD-10-CM | POA: Diagnosis not present

## 2021-03-19 DIAGNOSIS — Z66 Do not resuscitate: Secondary | ICD-10-CM | POA: Diagnosis not present

## 2021-03-19 DIAGNOSIS — Z950 Presence of cardiac pacemaker: Secondary | ICD-10-CM | POA: Diagnosis not present

## 2021-03-19 DIAGNOSIS — R410 Disorientation, unspecified: Secondary | ICD-10-CM | POA: Diagnosis not present

## 2021-03-19 DIAGNOSIS — R531 Weakness: Secondary | ICD-10-CM | POA: Diagnosis not present

## 2021-03-19 DIAGNOSIS — I10 Essential (primary) hypertension: Secondary | ICD-10-CM | POA: Diagnosis not present

## 2021-03-19 DIAGNOSIS — A0472 Enterocolitis due to Clostridium difficile, not specified as recurrent: Secondary | ICD-10-CM | POA: Diagnosis not present

## 2021-03-19 DIAGNOSIS — Z743 Need for continuous supervision: Secondary | ICD-10-CM | POA: Diagnosis not present

## 2021-03-19 DIAGNOSIS — E876 Hypokalemia: Secondary | ICD-10-CM | POA: Diagnosis not present

## 2021-03-19 DIAGNOSIS — M4856XA Collapsed vertebra, not elsewhere classified, lumbar region, initial encounter for fracture: Secondary | ICD-10-CM | POA: Diagnosis not present

## 2021-03-19 DIAGNOSIS — G9341 Metabolic encephalopathy: Secondary | ICD-10-CM | POA: Diagnosis not present

## 2021-03-19 DIAGNOSIS — W19XXXA Unspecified fall, initial encounter: Secondary | ICD-10-CM | POA: Diagnosis not present

## 2021-03-19 DIAGNOSIS — R296 Repeated falls: Secondary | ICD-10-CM | POA: Diagnosis not present

## 2021-03-19 DIAGNOSIS — F03C18 Unspecified dementia, severe, with other behavioral disturbance: Secondary | ICD-10-CM | POA: Diagnosis not present

## 2021-03-19 DIAGNOSIS — I700 Atherosclerosis of aorta: Secondary | ICD-10-CM | POA: Diagnosis not present

## 2021-03-19 DIAGNOSIS — E7800 Pure hypercholesterolemia, unspecified: Secondary | ICD-10-CM | POA: Diagnosis not present

## 2021-03-19 DIAGNOSIS — Z20822 Contact with and (suspected) exposure to covid-19: Secondary | ICD-10-CM | POA: Diagnosis not present

## 2021-03-24 DIAGNOSIS — R531 Weakness: Secondary | ICD-10-CM | POA: Diagnosis not present

## 2021-03-24 DIAGNOSIS — M549 Dorsalgia, unspecified: Secondary | ICD-10-CM | POA: Diagnosis not present

## 2021-03-24 DIAGNOSIS — Z7401 Bed confinement status: Secondary | ICD-10-CM | POA: Diagnosis not present

## 2021-03-24 DIAGNOSIS — Z743 Need for continuous supervision: Secondary | ICD-10-CM | POA: Diagnosis not present

## 2021-04-12 DIAGNOSIS — R531 Weakness: Secondary | ICD-10-CM | POA: Insufficient documentation

## 2021-04-12 DIAGNOSIS — E871 Hypo-osmolality and hyponatremia: Secondary | ICD-10-CM | POA: Insufficient documentation

## 2021-04-12 DIAGNOSIS — S32000A Wedge compression fracture of unspecified lumbar vertebra, initial encounter for closed fracture: Secondary | ICD-10-CM | POA: Insufficient documentation

## 2021-04-12 DIAGNOSIS — R262 Difficulty in walking, not elsewhere classified: Secondary | ICD-10-CM | POA: Insufficient documentation

## 2021-04-12 DIAGNOSIS — Y92009 Unspecified place in unspecified non-institutional (private) residence as the place of occurrence of the external cause: Secondary | ICD-10-CM | POA: Insufficient documentation

## 2021-04-12 DIAGNOSIS — D696 Thrombocytopenia, unspecified: Secondary | ICD-10-CM | POA: Insufficient documentation

## 2021-04-12 DIAGNOSIS — Z8619 Personal history of other infectious and parasitic diseases: Secondary | ICD-10-CM | POA: Insufficient documentation

## 2021-04-26 ENCOUNTER — Ambulatory Visit (INDEPENDENT_AMBULATORY_CARE_PROVIDER_SITE_OTHER): Payer: Medicare Other | Admitting: Cardiology

## 2021-04-26 VITALS — BP 96/64 | HR 60 | Ht 63.0 in | Wt 159.2 lb

## 2021-04-26 DIAGNOSIS — R0602 Shortness of breath: Secondary | ICD-10-CM

## 2021-04-26 DIAGNOSIS — I442 Atrioventricular block, complete: Secondary | ICD-10-CM

## 2021-04-26 DIAGNOSIS — Z952 Presence of prosthetic heart valve: Secondary | ICD-10-CM | POA: Diagnosis not present

## 2021-04-26 DIAGNOSIS — I6523 Occlusion and stenosis of bilateral carotid arteries: Secondary | ICD-10-CM | POA: Diagnosis not present

## 2021-04-26 DIAGNOSIS — Z950 Presence of cardiac pacemaker: Secondary | ICD-10-CM | POA: Diagnosis not present

## 2021-04-26 DIAGNOSIS — Z8673 Personal history of transient ischemic attack (TIA), and cerebral infarction without residual deficits: Secondary | ICD-10-CM

## 2021-04-26 DIAGNOSIS — I10 Essential (primary) hypertension: Secondary | ICD-10-CM

## 2021-04-26 NOTE — Addendum Note (Signed)
Addended by: Jacobo Forest D on: 04/26/2021 11:15 AM ? ? Modules accepted: Orders ? ?

## 2021-04-26 NOTE — Progress Notes (Signed)
?Cardiology Office Note:   ? ?Date:  04/26/2021  ? ?IDMaury Sutton, DOB 1935/10/25, MRN [id number] ? ?PCP:  Maris Berger, MD  ?Cardiologist:  Jenne Campus, MD   ? ?Referring MD: Maris Berger, MD  ? ?Chief Complaint  ?Patient presents with  ? Follow-up  ?Doing well ? ?History of Present Illness:   ? ?Robin Sutton is a 86 y.o. female with past medical history significant for severe aortic stenosis that required intervention.  In 2019 she had TAVI done with a 23 mm Medtronic evolute pro valve inserted via right femoral approach.  She also had complete heart block that required pacemaker implantation, history of cardiac arterial disease up to 39% stenosis bilaterally, history of CVA without residual changes, essential hypertension, dyslipidemia.  Few weeks ago she had a being in the hospital that was because of urinary tract infection as well as C. difficile got better.  Her dementia seems to be progressing. ? ?Past Medical History:  ?Diagnosis Date  ? Carotid stenosis, bilateral 04/19/2015  ? Formatting of this note might be different from the original. 1-39% bilaterally  ? CHB (complete heart block) ([place]) 09/30/2018  ? CKD (chronic kidney disease), stage III ([place]) 10/02/2018  ? COVID 02/2020  ? Dyslipidemia 07/09/2016  ? Essential hypertension 07/09/2016  ? Gait disturbance 01/03/2020  ? History of CVA (cerebrovascular accident)   ? History of CVA (cerebrovascular accident) without residual deficits 04/19/2015  ? HLD (hyperlipidemia)   ? Hypertension   ? Hypertensive heart disease without heart failure 09/30/2018  ? Hypertensive heart/kidney disease without HF and with CKD stage III ([place]) 10/02/2018  ? Memory problem 02/12/2019  ? Mixed hyperlipidemia 04/19/2015  ? Pacemaker 12/03/2016  ? St Jude Nov 2018  ? S/P placement of cardiac pacemaker   ? a. developed CHB s/p TAVR and got a St. Jude Medical Assurity MRI compatible dual chamber [id number] (serial number  [id number])  ? S/P TAVR (transcatheter aortic valve  replacement)   ? a. 10/2016: 23 mm Medtronic Evolut Pro transcatheter heart valve placed via percutaneous right transfemoral approach   ? Severe aortic stenosis 07/09/2016  ? Moderate based on echo from May 2017 Severe by echo from July 2018 mean gradient of 47  Formatting of this note might be different from the original. Moderate based on echo from May 2017 Severe by echo from July 2018 mean gradient of 47  ? ? ?Past Surgical History:  ?Procedure Laterality Date  ? BREAST BIOPSY    ? PACEMAKER IMPLANT N/A 10/20/2016  ? St Jude Medical Assurity MRI conditional  dual-chamber pacemaker for symptomatic complete heart block by Dr Rayann Heman  ? RIGHT/LEFT HEART CATH AND CORONARY ANGIOGRAPHY N/A 09/11/2016  ? Procedure: RIGHT/LEFT HEART CATH AND CORONARY ANGIOGRAPHY;  Surgeon: Sherren Mocha, MD;  Location: [facility];  Service: Cardiovascular;  Laterality: N/A;  ? TEE WITHOUT CARDIOVERSION N/A 10/17/2016  ? Procedure: TRANSESOPHAGEAL ECHOCARDIOGRAM (TEE);  Surgeon: Sherren Mocha, MD;  Location: [facility];  Service: Open Heart Surgery;  Laterality: N/A;  ? TONSILLECTOMY    ? TRANSCATHETER AORTIC VALVE REPLACEMENT, TRANSFEMORAL N/A 10/17/2016  ? Procedure: TRANSCATHETER AORTIC VALVE REPLACEMENT, TRANSFEMORAL;  Surgeon: Sherren Mocha, MD;  Location: [facility];  Service: Open Heart Surgery;  Laterality: N/A;  ? ? ?Current Medications: ?Current Meds  ?Medication Sig  ? aspirin EC 81 MG EC tablet Take 1 tablet (81 mg total) by mouth daily.  ? Coenzyme Q10 200 MG capsule Take 200 mg by mouth daily.  ? donepezil (  ARICEPT) 5 MG tablet Take 5 mg by mouth at bedtime.  ? escitalopram (LEXAPRO) 5 MG tablet Take 5 mg by mouth daily.  ? lisinopril (ZESTRIL) 5 MG tablet Take 1 tablet (5 mg total) by mouth every evening. (Patient taking differently: Take 10 mg by mouth every evening.)  ? nitroGLYCERIN (NITROSTAT) 0.4 MG SL tablet Place 1 tablet (0.4 mg total) under the tongue every 5 (five) minutes as needed for chest pain.  ? rosuvastatin  (CRESTOR) 20 MG tablet Take 20 mg by mouth daily.  ? Vitamin D, Ergocalciferol, (DRISDOL) 50000 units CAPS capsule Take 50,000 Units by mouth every Monday.   ? vitamin E 1000 UNIT capsule Take 1,000 Units by mouth daily.  ?  ? ?Allergies:   Adhesive [tape]  ? ?Social History  ? ?Socioeconomic History  ? Marital status: Widowed  ?  Spouse name: Not on file  ? Number of children: Not on file  ? Years of education: Not on file  ? Highest education level: Not on file  ?Occupational History  ? Not on file  ?Tobacco Use  ? Smoking status: Never  ? Smokeless tobacco: Never  ?Vaping Use  ? Vaping Use: Never used  ?Substance and Sexual Activity  ? Alcohol use: No  ? Drug use: No  ? Sexual activity: Not on file  ?Other Topics Concern  ? Not on file  ?Social History Narrative  ? Not on file  ? ?Social Determinants of Health  ? ?Financial Resource Strain: Not on file  ?Food Insecurity: Not on file  ?Transportation Needs: Not on file  ?Physical Activity: Not on file  ?Stress: Not on file  ?Social Connections: Not on file  ?  ? ?Family History: ?The patient's family history includes Diabetes in her maternal grandfather; Heart disease in her brother, maternal grandfather, and mother; Hypertension in her brother and mother; Kidney failure in her father; Stroke in her mother. ?ROS:   ?Please see the history of present illness.    ?All 14 point review of systems negative except as described per history of present illness ? ?EKGs/Labs/Other Studies Reviewed:   ? ? ? ?Recent Labs: ?No results found for requested labs within last 8760 hours.  ?Recent Lipid Panel ?No results found for: CHOL, TRIG, HDL, CHOLHDL, VLDL, LDLCALC, LDLDIRECT ? ?Physical Exam:   ? ?VS:  There were no vitals taken for this visit.   ? ?Wt Readings from Last 3 Encounters:  ?10/26/20 156 lb (70.8 kg)  ?07/17/20 157 lb 12.8 oz (71.6 kg)  ?04/11/20 155 lb (70.3 kg)  ?  ? ?GEN:  Well nourished, well developed in no acute distress ?HEENT: Normal ?NECK: No JVD; No  carotid bruits ?LYMPHATICS: No lymphadenopathy ?CARDIAC: RRR, systolic ejection murmur grade 2/6 best heard right upper portion of the sternum, no rubs, no gallops ?RESPIRATORY:  Clear to auscultation without rales, wheezing or rhonchi  ?ABDOMEN: Soft, non-tender, non-distended ?MUSCULOSKELETAL:  No edema; No deformity  ?SKIN: Warm and dry ?LOWER EXTREMITIES: no swelling ?NEUROLOGIC:  Alert and oriented x 3 ?PSYCHIATRIC:  Normal affect  ? ?ASSESSMENT:   ? ?1. S/P TAVR (transcatheter aortic valve replacement)   ?2. History of CVA (cerebrovascular accident) without residual deficits   ?3. S/P placement of cardiac pacemaker   ?4. Carotid stenosis, bilateral   ?5. Essential hypertension   ?6. CHB (complete heart block) (HCC)   ? ?PLAN:   ? ?In order of problems listed above: ? ?Status post TAVI.  We will schedule her to have echocardiogram  in May.  Overall hemodynamically compensated seems good ?History of CVA stable no new problems, ?Pacemaker present, Abbott device last interrogation February 02, 2021 reviewed along with the visit for expected 5.3 years.  We will continue monitoring ?Essential hypertension blood pressure well controlled continue present management. ?Complete heart block.  Addressed with the pacemaker and device functioning properly ?Carotic arterial disease: We will schedule her to have carotic ultrasound ? ? ?Medication Adjustments/Labs and Tests Ordered: ?Current medicines are reviewed at length with the patient today.  Concerns regarding medicines are outlined above.  ?No orders of the defined types were placed in this encounter. ? ?Medication changes: No orders of the defined types were placed in this encounter. ? ? ?Signed, ?Park Liter, MD, [facility] ?04/26/2021 10:47 AM    ?[place] ?

## 2021-04-26 NOTE — Patient Instructions (Signed)
Medication Instructions:  ?Your physician recommends that you continue on your current medications as directed. Please refer to the Current Medication list given to you today.  ?*If you need a refill on your cardiac medications before your next appointment, please call your pharmacy* ? ? ?Lab Work: ?None Ordered ?If you have labs (blood work) drawn today and your tests are completely normal, you will receive your results only by: ?MyChart Message (if you have MyChart) OR ?A paper copy in the mail ?If you have any lab test that is abnormal or we need to change your treatment, we will call you to review the results. ? ? ?Testing/Procedures: ?Your physician has requested that you have an echocardiogram. Echocardiography is a painless test that uses sound waves to create images of your heart. It provides your doctor with information about the size and shape of your heart and how well your heart?s chambers and valves are working. This procedure takes approximately one hour. There are no restrictions for this procedure.  ? ?Your physician has requested that you have a carotid duplex. This test is an ultrasound of the carotid arteries in your neck. It looks at blood flow through these arteries that supply the brain with blood. Allow one hour for this exam. There are no restrictions or special instructions.  ? ? ?Follow-Up: ?At [facility], you and your health needs are our priority.  As part of our continuing mission to provide you with exceptional heart care, we have created designated Provider Care Teams.  These Care Teams include your primary Cardiologist (physician) and Advanced Practice Providers (APPs -  Physician Assistants and Nurse Practitioners) who all work together to provide you with the care you need, when you need it. ? ?We recommend signing up for the patient portal called "MyChart".  Sign up information is provided on this After Visit Summary.  MyChart is used to connect with patients for Virtual Visits  (Telemedicine).  Patients are able to view lab/test results, encounter notes, upcoming appointments, etc.  Non-urgent messages can be sent to your provider as well.   ?To learn more about what you can do with MyChart, go to [email or web address].   ? ?Your next appointment:   ?6 month(s) ? ?The format for your next appointment:   ?In Person ? ?Provider:   ?Jenne Campus, MD  ? ? ?Other Instructions ?NA  ?

## 2021-05-04 ENCOUNTER — Ambulatory Visit (INDEPENDENT_AMBULATORY_CARE_PROVIDER_SITE_OTHER): Payer: Medicare Other

## 2021-05-04 DIAGNOSIS — I442 Atrioventricular block, complete: Secondary | ICD-10-CM

## 2021-05-04 LAB — CUP PACEART REMOTE DEVICE CHECK
Battery Remaining Longevity: 60 mo
Battery Remaining Percentage: 51 %
Battery Voltage: 2.98 V
Brady Statistic AP VP Percent: 40 %
Brady Statistic AP VS Percent: 1 %
Brady Statistic AS VP Percent: 60 %
Brady Statistic AS VS Percent: 1 %
Brady Statistic RA Percent Paced: 38 %
Brady Statistic RV Percent Paced: 99 %
Date Time Interrogation Session: 20230428020015
Implantable Lead Implant Date: 20181014
Implantable Lead Implant Date: 20181014
Implantable Lead Location: 753859
Implantable Lead Location: 753860
Implantable Pulse Generator Implant Date: 20181014
Lead Channel Impedance Value: 440 Ohm
Lead Channel Impedance Value: 530 Ohm
Lead Channel Pacing Threshold Amplitude: 0.5 V
Lead Channel Pacing Threshold Amplitude: 0.75 V
Lead Channel Pacing Threshold Pulse Width: 0.5 ms
Lead Channel Pacing Threshold Pulse Width: 0.5 ms
Lead Channel Sensing Intrinsic Amplitude: 12 mV
Lead Channel Sensing Intrinsic Amplitude: 2.7 mV
Lead Channel Setting Pacing Amplitude: 1 V
Lead Channel Setting Pacing Amplitude: 2 V
Lead Channel Setting Pacing Pulse Width: 0.5 ms
Lead Channel Setting Sensing Sensitivity: 4 mV
Pulse Gen Model: 2272
Pulse Gen Serial Number: 8951116

## 2021-05-14 ENCOUNTER — Ambulatory Visit (INDEPENDENT_AMBULATORY_CARE_PROVIDER_SITE_OTHER): Payer: Medicare Other

## 2021-05-14 DIAGNOSIS — I6523 Occlusion and stenosis of bilateral carotid arteries: Secondary | ICD-10-CM

## 2021-05-14 DIAGNOSIS — R0602 Shortness of breath: Secondary | ICD-10-CM

## 2021-05-14 DIAGNOSIS — Z8673 Personal history of transient ischemic attack (TIA), and cerebral infarction without residual deficits: Secondary | ICD-10-CM

## 2021-05-14 DIAGNOSIS — Z952 Presence of prosthetic heart valve: Secondary | ICD-10-CM | POA: Diagnosis not present

## 2021-05-14 LAB — ECHOCARDIOGRAM COMPLETE
AR max vel: 1.3 cm2
AV Area VTI: 1.41 cm2
AV Area mean vel: 1.23 cm2
AV Mean grad: 21 mmHg
AV Peak grad: 37.5 mmHg
Ao pk vel: 3.06 m/s
Area-P 1/2: 1.62 cm2
MV VTI: 1.45 cm2
S' Lateral: 2.5 cm

## 2021-05-17 ENCOUNTER — Telehealth: Payer: Self-pay

## 2021-05-17 NOTE — Telephone Encounter (Signed)
-----   Message from Park Liter, MD sent at 05/15/2021  9:08 PM EDT ----- ?Echocardiogram showed normally functioning aortic valve prosthesis, ?

## 2021-05-17 NOTE — Telephone Encounter (Signed)
Patient notified of results.

## 2021-05-18 NOTE — Progress Notes (Signed)
Remote pacemaker transmission.   

## 2021-05-22 ENCOUNTER — Telehealth: Payer: Self-pay

## 2021-05-22 NOTE — Telephone Encounter (Signed)
-----   Message from Park Liter, MD sent at 05/16/2021  1:50 PM EDT ----- ?Up to 39% stenosis on both sides of carotids.  Medical therapy ?

## 2021-05-22 NOTE — Telephone Encounter (Signed)
[name] notified of results ?

## 2021-08-03 ENCOUNTER — Ambulatory Visit (INDEPENDENT_AMBULATORY_CARE_PROVIDER_SITE_OTHER): Payer: Medicare Other

## 2021-08-03 DIAGNOSIS — I442 Atrioventricular block, complete: Secondary | ICD-10-CM | POA: Diagnosis not present

## 2021-08-03 LAB — CUP PACEART REMOTE DEVICE CHECK
Battery Remaining Longevity: 56 mo
Battery Remaining Percentage: 48 %
Battery Voltage: 2.98 V
Brady Statistic AP VP Percent: 40 %
Brady Statistic AP VS Percent: 1 %
Brady Statistic AS VP Percent: 60 %
Brady Statistic AS VS Percent: 1 %
Brady Statistic RA Percent Paced: 39 %
Brady Statistic RV Percent Paced: 99 %
Date Time Interrogation Session: 20230728020015
Implantable Lead Implant Date: 20181014
Implantable Lead Implant Date: 20181014
Implantable Lead Location: 753859
Implantable Lead Location: 753860
Implantable Pulse Generator Implant Date: 20181014
Lead Channel Impedance Value: 430 Ohm
Lead Channel Impedance Value: 530 Ohm
Lead Channel Pacing Threshold Amplitude: 0.5 V
Lead Channel Pacing Threshold Amplitude: 0.75 V
Lead Channel Pacing Threshold Pulse Width: 0.5 ms
Lead Channel Pacing Threshold Pulse Width: 0.5 ms
Lead Channel Sensing Intrinsic Amplitude: 1.7 mV
Lead Channel Sensing Intrinsic Amplitude: 12 mV
Lead Channel Setting Pacing Amplitude: 1 V
Lead Channel Setting Pacing Amplitude: 2 V
Lead Channel Setting Pacing Pulse Width: 0.5 ms
Lead Channel Setting Sensing Sensitivity: 4 mV
Pulse Gen Model: 2272
Pulse Gen Serial Number: 8951116

## 2021-08-22 NOTE — Progress Notes (Signed)
Remote pacemaker transmission.   

## 2021-08-27 DIAGNOSIS — M810 Age-related osteoporosis without current pathological fracture: Secondary | ICD-10-CM | POA: Insufficient documentation

## 2021-10-12 ENCOUNTER — Other Ambulatory Visit: Payer: Self-pay

## 2021-10-22 ENCOUNTER — Encounter: Payer: Self-pay | Admitting: Cardiology

## 2021-10-22 ENCOUNTER — Ambulatory Visit (INDEPENDENT_AMBULATORY_CARE_PROVIDER_SITE_OTHER): Payer: Medicare Other | Admitting: Cardiology

## 2021-10-22 ENCOUNTER — Ambulatory Visit: Payer: Medicare Other | Attending: Cardiology | Admitting: Cardiology

## 2021-10-22 VITALS — BP 119/68 | HR 62 | Ht 63.0 in | Wt 153.0 lb

## 2021-10-22 DIAGNOSIS — I10 Essential (primary) hypertension: Secondary | ICD-10-CM

## 2021-10-22 DIAGNOSIS — I6523 Occlusion and stenosis of bilateral carotid arteries: Secondary | ICD-10-CM

## 2021-10-22 DIAGNOSIS — Z952 Presence of prosthetic heart valve: Secondary | ICD-10-CM | POA: Diagnosis not present

## 2021-10-22 DIAGNOSIS — I442 Atrioventricular block, complete: Secondary | ICD-10-CM

## 2021-10-22 DIAGNOSIS — Z8673 Personal history of transient ischemic attack (TIA), and cerebral infarction without residual deficits: Secondary | ICD-10-CM

## 2021-10-22 DIAGNOSIS — Z950 Presence of cardiac pacemaker: Secondary | ICD-10-CM | POA: Diagnosis not present

## 2021-10-22 DIAGNOSIS — E785 Hyperlipidemia, unspecified: Secondary | ICD-10-CM

## 2021-10-22 LAB — CUP PACEART INCLINIC DEVICE CHECK
Battery Remaining Longevity: 55 mo
Battery Voltage: 2.98 V
Brady Statistic RA Percent Paced: 39 %
Brady Statistic RV Percent Paced: 99.97 %
Date Time Interrogation Session: 20231016172112
Implantable Lead Implant Date: 20181014
Implantable Lead Implant Date: 20181014
Implantable Lead Location: 753859
Implantable Lead Location: 753860
Implantable Pulse Generator Implant Date: 20181014
Lead Channel Impedance Value: 450 Ohm
Lead Channel Impedance Value: 537.5 Ohm
Lead Channel Pacing Threshold Amplitude: 0.5 V
Lead Channel Pacing Threshold Amplitude: 0.5 V
Lead Channel Pacing Threshold Amplitude: 1 V
Lead Channel Pacing Threshold Amplitude: 1 V
Lead Channel Pacing Threshold Pulse Width: 0.5 ms
Lead Channel Pacing Threshold Pulse Width: 0.5 ms
Lead Channel Pacing Threshold Pulse Width: 0.5 ms
Lead Channel Pacing Threshold Pulse Width: 0.5 ms
Lead Channel Sensing Intrinsic Amplitude: 1.5 mV
Lead Channel Sensing Intrinsic Amplitude: 3.5 mV
Lead Channel Setting Pacing Amplitude: 1 V
Lead Channel Setting Pacing Amplitude: 2 V
Lead Channel Setting Pacing Pulse Width: 0.5 ms
Lead Channel Setting Sensing Sensitivity: 4 mV
Pulse Gen Model: 2272
Pulse Gen Serial Number: 8951116

## 2021-10-22 NOTE — Progress Notes (Signed)
Cardiology Office Note:    Date:  10/22/2021   ID:  Robin Sutton, DOB 02-Jul-1935, MRN [id number]  PCP:  Maris Berger, MD  Cardiologist:  Jenne Campus, MD    Referring MD: Maris Berger, MD   Chief Complaint  Patient presents with   Follow-up  Doing fine  History of Present Illness:    Robin Sutton is a 86 y.o. female with past medical history significant for severe arctic stenosis that required intervention.  In 2019 she had TAVI done with 23 mm Medtronic evolute pro valve inserted via right femoral approach.  As a consequence she end up having complete heart block that required pacemaker.  She does have send Jude device.  Also history of carotic arterial disease with up to 39% stenosis bilaterally, history of CVA without residual changes, essential hypertension, dyslipidemia. She is in my office today for follow-up.  Overall she is doing very well.  She denies have any chest pain tightness squeezing pressure burning chest.  She does have some dementia which is progressing but still quite cheerful.  Past Medical History:  Diagnosis Date   Carotid stenosis, bilateral 04/19/2015   Formatting of this note might be different from the original. 1-39% bilaterally   CHB (complete heart block) ([place]) 09/30/2018   CKD (chronic kidney disease), stage III ([place]) 10/02/2018   COVID 02/2020   Dyslipidemia 07/09/2016   Essential hypertension 07/09/2016   Gait disturbance 01/03/2020   History of CVA (cerebrovascular accident)    History of CVA (cerebrovascular accident) without residual deficits 04/19/2015   HLD (hyperlipidemia)    Hypertension    Hypertensive heart disease without heart failure 09/30/2018   Hypertensive heart/kidney disease without HF and with CKD stage III ([place]) 10/02/2018   Memory problem 02/12/2019   Mixed hyperlipidemia 04/19/2015   Pacemaker 12/03/2016   St Jude Nov 2018   S/P placement of cardiac pacemaker    a. developed CHB s/p TAVR and got a St. Jude Medical  Assurity MRI compatible dual chamber [id number] (serial number  [id number])   S/P TAVR (transcatheter aortic valve replacement)    a. 10/2016: 23 mm Medtronic Evolut Pro transcatheter heart valve placed via percutaneous right transfemoral approach    Severe aortic stenosis 07/09/2016   Moderate based on echo from May 2017 Severe by echo from July 2018 mean gradient of 47  Formatting of this note might be different from the original. Moderate based on echo from May 2017 Severe by echo from July 2018 mean gradient of 47    Past Surgical History:  Procedure Laterality Date   BREAST BIOPSY     PACEMAKER IMPLANT N/A 10/20/2016   St Jude Medical Assurity MRI conditional  dual-chamber pacemaker for symptomatic complete heart block by Dr Rayann Heman   RIGHT/LEFT HEART CATH AND CORONARY ANGIOGRAPHY N/A 09/11/2016   Procedure: RIGHT/LEFT HEART CATH AND CORONARY ANGIOGRAPHY;  Surgeon: Sherren Mocha, MD;  Location: [facility];  Service: Cardiovascular;  Laterality: N/A;   TEE WITHOUT CARDIOVERSION N/A 10/17/2016   Procedure: TRANSESOPHAGEAL ECHOCARDIOGRAM (TEE);  Surgeon: Sherren Mocha, MD;  Location: [facility];  Service: Open Heart Surgery;  Laterality: N/A;   TONSILLECTOMY     TRANSCATHETER AORTIC VALVE REPLACEMENT, TRANSFEMORAL N/A 10/17/2016   Procedure: TRANSCATHETER AORTIC VALVE REPLACEMENT, TRANSFEMORAL;  Surgeon: Sherren Mocha, MD;  Location: [facility];  Service: Open Heart Surgery;  Laterality: N/A;    Current Medications: Current Meds  Medication Sig   alendronate (FOSAMAX) 70 MG tablet Take 1 tablet by mouth once  a week.   aspirin EC 81 MG EC tablet Take 1 tablet (81 mg total) by mouth daily.   Calcium Carb-Cholecalciferol (CALCIUM + D3 PO) Take 2 tablets by mouth daily.   Cholecalciferol (VITAMIN D3) 1000 units CAPS Take 1 Capful by mouth daily.   Coenzyme Q10 200 MG capsule Take 200 mg by mouth daily.   donepezil (ARICEPT) 5 MG tablet Take 5 mg by mouth at bedtime.   escitalopram (LEXAPRO) 5 MG  tablet Take 5 mg by mouth daily.   lisinopril (ZESTRIL) 10 MG tablet Take 10 mg by mouth daily.   nitroGLYCERIN (NITROSTAT) 0.4 MG SL tablet Place 1 tablet (0.4 mg total) under the tongue every 5 (five) minutes as needed for chest pain.   rosuvastatin (CRESTOR) 20 MG tablet Take 20 mg by mouth daily.   vitamin E 1000 UNIT capsule Take 1,000 Units by mouth daily.     Allergies:   Adhesive [tape]   Social History   Socioeconomic History   Marital status: Widowed    Spouse name: Not on file   Number of children: Not on file   Years of education: Not on file   Highest education level: Not on file  Occupational History   Not on file  Tobacco Use   Smoking status: Never   Smokeless tobacco: Never  Vaping Use   Vaping Use: Never used  Substance and Sexual Activity   Alcohol use: No   Drug use: No   Sexual activity: Not on file  Other Topics Concern   Not on file  Social History Narrative   Not on file   Social Determinants of Health   Financial Resource Strain: Not on file  Food Insecurity: Not on file  Transportation Needs: Not on file  Physical Activity: Not on file  Stress: Not on file  Social Connections: Not on file     Family History: The patient's family history includes Diabetes in her maternal grandfather; Heart disease in her brother, maternal grandfather, and mother; Hypertension in her brother and mother; Kidney failure in her father; Stroke in her mother. ROS:   Please see the history of present illness.    All 14 point review of systems negative except as described per history of present illness  EKGs/Labs/Other Studies Reviewed:      Recent Labs: No results found for requested labs within last 365 days.  Recent Lipid Panel No results found for: "CHOL", "TRIG", "HDL", "CHOLHDL", "VLDL", "LDLCALC", "LDLDIRECT"  Physical Exam:    VS:  BP 119/68   Pulse 62   Ht '5\' 3"'$  (1.6 m)   Wt 153 lb (69.4 kg)   SpO2 96%   BMI 27.10 kg/m     Wt Readings from  Last 3 Encounters:  10/22/21 153 lb (69.4 kg)  10/22/21 153 lb (69.4 kg)  04/26/21 159 lb 3.2 oz (72.2 kg)     GEN:  Well nourished, well developed in no acute distress HEENT: Normal NECK: No JVD; No carotid bruits LYMPHATICS: No lymphadenopathy CARDIAC: RRR, no murmurs, no rubs, no gallops RESPIRATORY:  Clear to auscultation without rales, wheezing or rhonchi  ABDOMEN: Soft, non-tender, non-distended MUSCULOSKELETAL:  No edema; No deformity  SKIN: Warm and dry LOWER EXTREMITIES: no swelling NEUROLOGIC:  Alert and oriented x 3 PSYCHIATRIC:  Normal affect   ASSESSMENT:    1. S/P TAVR (transcatheter aortic valve replacement)   2. S/P placement of cardiac pacemaker   3. Carotid stenosis, bilateral   4. Essential hypertension  5. History of CVA (cerebrovascular accident) without residual deficits   6. Dyslipidemia   7. CHB (complete heart block) (HCC)    PLAN:    In order of problems listed above:  Status post TAVI is doing well last echocardiogram done in May of this year normal function continue present management. Pacemaker present send Jude device normal function continue present management Carotic artery stenosis not critical continue monitoring Essential hypertension blood pressure well controlled we will continue present management. History of CVA stable. Dyslipidemia I did review labs done by primary care physician LDL of 96 HDL 51 however this is from year ago.  We will get another test   Medication Adjustments/Labs and Tests Ordered: Current medicines are reviewed at length with the patient today.  Concerns regarding medicines are outlined above.  No orders of the defined types were placed in this encounter.  Medication changes: No orders of the defined types were placed in this encounter.   Signed, Park Liter, MD, [facility] 10/22/2021 5:11 PM    [place]

## 2021-10-22 NOTE — Patient Instructions (Signed)

## 2021-10-22 NOTE — Progress Notes (Signed)
Electrophysiology Office Note   Date:  10/22/2021   ID:  Robin Sutton, DOB [birth year], MRN [id number]  PCP:  Maris Berger, MD  Cardiologist:  Agustin Cree Primary Electrophysiologist:  Burhanuddin Kohlmann Meredith Leeds, MD    Chief Complaint: Pacemaker   History of Present Illness: Robin Sutton is a 86 y.o. female who is being seen today for the evaluation of pacemaker at the request of [facility]. Presenting today for electrophysiology evaluation.  She has a history significant hypertension, hyperlipidemia, aortic stenosis status post TAVR in 2018, CVA.  She is post Environmental health practitioner dual-chamber pacemaker implanted in 2018 for complete heart block.  Today, denies symptoms of palpitations, chest pain, shortness of breath, orthopnea, PND, lower extremity edema, claudication, dizziness, presyncope, syncope, bleeding, or neurologic sequela. The patient is tolerating medications without difficulties.  Since being seen she has done well.  She has no chest pain or shortness of breath.  She does have a few episodes where her daughter says that she spaces out.  There were no arrhythmias noted on her pacemaker.  Her daughter feels that this is possibly due to her dementia.   Past Medical History:  Diagnosis Date   Carotid stenosis, bilateral 04/19/2015   Formatting of this note might be different from the original. 1-39% bilaterally   CHB (complete heart block) ([place]) 09/30/2018   CKD (chronic kidney disease), stage III ([place]) 10/02/2018   COVID 02/2020   Dyslipidemia 07/09/2016   Essential hypertension 07/09/2016   Gait disturbance 01/03/2020   History of CVA (cerebrovascular accident)    History of CVA (cerebrovascular accident) without residual deficits 04/19/2015   HLD (hyperlipidemia)    Hypertension    Hypertensive heart disease without heart failure 09/30/2018   Hypertensive heart/kidney disease without HF and with CKD stage III ([place]) 10/02/2018   Memory problem 02/12/2019   Mixed hyperlipidemia  04/19/2015   Pacemaker 12/03/2016   St Jude Nov 2018   S/P placement of cardiac pacemaker    a. developed CHB s/p TAVR and got a St. Jude Medical Assurity MRI compatible dual chamber [id number] (serial number  [id number])   S/P TAVR (transcatheter aortic valve replacement)    a. 10/2016: 23 mm Medtronic Evolut Pro transcatheter heart valve placed via percutaneous right transfemoral approach    Severe aortic stenosis 07/09/2016   Moderate based on echo from May 2017 Severe by echo from July 2018 mean gradient of 47  Formatting of this note might be different from the original. Moderate based on echo from May 2017 Severe by echo from July 2018 mean gradient of 47   Past Surgical History:  Procedure Laterality Date   BREAST BIOPSY     PACEMAKER IMPLANT N/A 10/20/2016   St Jude Medical Assurity MRI conditional  dual-chamber pacemaker for symptomatic complete heart block by Dr Rayann Heman   RIGHT/LEFT HEART CATH AND CORONARY ANGIOGRAPHY N/A 09/11/2016   Procedure: RIGHT/LEFT HEART CATH AND CORONARY ANGIOGRAPHY;  Surgeon: Sherren Mocha, MD;  Location: [place] CV [facility];  Service: Cardiovascular;  Laterality: N/A;   TEE WITHOUT CARDIOVERSION N/A 10/17/2016   Procedure: TRANSESOPHAGEAL ECHOCARDIOGRAM (TEE);  Surgeon: Sherren Mocha, MD;  Location: [place];  Service: Open Heart Surgery;  Laterality: N/A;   TONSILLECTOMY     TRANSCATHETER AORTIC VALVE REPLACEMENT, TRANSFEMORAL N/A 10/17/2016   Procedure: TRANSCATHETER AORTIC VALVE REPLACEMENT, TRANSFEMORAL;  Surgeon: Sherren Mocha, MD;  Location: [place];  Service: Open Heart Surgery;  Laterality: N/A;     Current Outpatient Medications  Medication Sig Dispense Refill  alendronate (FOSAMAX) 70 MG tablet Take 1 tablet by mouth once a week.     aspirin EC 81 MG EC tablet Take 1 tablet (81 mg total) by mouth daily.     Calcium Carb-Cholecalciferol (CALCIUM + D3 PO) Take 2 tablets by mouth daily.     Cholecalciferol (VITAMIN D3) 1000 units CAPS Take 1 Capful by  mouth daily.     Coenzyme Q10 200 MG capsule Take 200 mg by mouth daily.     donepezil (ARICEPT) 5 MG tablet Take 5 mg by mouth at bedtime.     escitalopram (LEXAPRO) 5 MG tablet Take 5 mg by mouth daily.     lisinopril (ZESTRIL) 10 MG tablet Take 10 mg by mouth daily.     rosuvastatin (CRESTOR) 20 MG tablet Take 20 mg by mouth daily.     vitamin E 1000 UNIT capsule Take 1,000 Units by mouth daily.     nitroGLYCERIN (NITROSTAT) 0.4 MG SL tablet Place 1 tablet (0.4 mg total) under the tongue every 5 (five) minutes as needed for chest pain. 25 tablet 3   No current facility-administered medications for this visit.    Allergies:   Adhesive [tape]   Social History:  The patient  reports that she has never smoked. She has never used smokeless tobacco. She reports that she does not drink alcohol and does not use drugs.   Family History:  The patient's family history includes Diabetes in her maternal grandfather; Heart disease in her brother, maternal grandfather, and mother; Hypertension in her brother and mother; Kidney failure in her father; Stroke in her mother.   ROS:  Please see the history of present illness.   Otherwise, review of systems is positive for none.   All other systems are reviewed and negative.   PHYSICAL EXAM: VS:  BP 119/68   Pulse 62   Ht '5\' 3"'$  (1.6 m)   Wt 153 lb (69.4 kg)   SpO2 96%   BMI 27.10 kg/m  , BMI Body mass index is 27.1 kg/m. GEN: Well nourished, well developed, in no acute distress  HEENT: normal  Neck: no JVD, carotid bruits, or masses Cardiac: RRR; no murmurs, rubs, or gallops,no edema  Respiratory:  clear to auscultation bilaterally, normal work of breathing GI: soft, nontender, nondistended, + BS MS: no deformity or atrophy  Skin: warm and dry, device site well healed Neuro:  Strength and sensation are intact Psych: euthymic mood, full affect  EKG:  EKG is ordered today. Personal review of the ekg ordered shows atrial sensed, ventricular  paced  Personal review of the device interrogation today. Results in [place]: No results found for requested labs within last 365 days.    Lipid Panel  No results found for: "CHOL", "TRIG", "HDL", "CHOLHDL", "VLDL", "LDLCALC", "LDLDIRECT"   Wt Readings from Last 3 Encounters:  10/22/21 153 lb (69.4 kg)  10/22/21 153 lb (69.4 kg)  04/26/21 159 lb 3.2 oz (72.2 kg)      Other studies Reviewed: Additional studies/ records that were reviewed today include: TTE 05/28/19  Review of the above records today demonstrates:   1. Left ventricular ejection fraction, by estimation, is 55 to 60%. The  left ventricle has normal function. The left ventricle has no regional  wall motion abnormalities. Left ventricular diastolic parameters are  consistent with Grade I diastolic  dysfunction (impaired relaxation).   2. Mild to moderate mitral valve regurgitation.   3. TAVR not well visualized. Appears to  have satisfactory function. Mean  and max gradient 17 and 27 mm Hg respectively.. The aortic valve was not  well visualized. There is a 23 mm Medtronic, stented (TAVR) valve present  in the aortic position.   ASSESSMENT AND PLAN:  1.  Complete heart block: Status post [facility] Jude dual-chamber pacemaker.  Device functioning appropriately.  No changes at this time.  2.  Aortic stenosis: Status post TAVR.  Plan per primary cardiology.  3.  Hypertension: Currently well controlled   Current medicines are reviewed at length with the patient today.   The patient does not have concerns regarding her medicines.  The following changes were made today: None  Labs/ tests ordered today include:  Orders Placed This Encounter  Procedures   EKG 12-Lead      Disposition:   FU with Markee Remlinger 1 year  Signed, Charlett Merkle Meredith Leeds, MD  10/22/2021 4:03 PM     [facility] [address] [place] [address] [phone] (office) [phone] (fax)

## 2021-10-22 NOTE — Progress Notes (Signed)
as

## 2021-10-22 NOTE — Patient Instructions (Signed)
Medication Instructions:  Your physician recommends that you continue on your current medications as directed. Please refer to the Current Medication list given to you today.  *If you need a refill on your cardiac medications before your next appointment, please call your pharmacy*   Lab Work: None ordered   Testing/Procedures: None ordered   Follow-Up: At [facility], you and your health needs are our priority.  As part of our continuing mission to provide you with exceptional heart care, we have created designated Provider Care Teams.  These Care Teams include your primary Cardiologist (physician) and Advanced Practice Providers (APPs -  Physician Assistants and Nurse Practitioners) who all work together to provide you with the care you need, when you need it.  Remote monitoring is used to monitor your Pacemaker or ICD from home. This monitoring reduces the number of office visits required to check your device to one time per year. It allows [place] to keep an eye on the functioning of your device to ensure it is working properly. You are scheduled for a device check from home on 11/02/2021. You may send your transmission at any time that day. If you have a wireless device, the transmission will be sent automatically. After your physician reviews your transmission, you will receive a postcard with your next transmission date.  Your next appointment:   1 year(s)  The format for your next appointment:   In Person  Provider:   Allegra Lai, MD    Thank you for choosing [facility]!!   Trinidad Curet, RN [phone]    Other Instructions  Important Information About [place]

## 2021-11-02 ENCOUNTER — Ambulatory Visit (INDEPENDENT_AMBULATORY_CARE_PROVIDER_SITE_OTHER): Payer: Medicare Other

## 2021-11-02 DIAGNOSIS — I442 Atrioventricular block, complete: Secondary | ICD-10-CM

## 2021-11-02 LAB — CUP PACEART REMOTE DEVICE CHECK
Battery Remaining Longevity: 53 mo
Battery Remaining Percentage: 46 %
Battery Voltage: 2.98 V
Brady Statistic AP VP Percent: 46 %
Brady Statistic AP VS Percent: 1 %
Brady Statistic AS VP Percent: 54 %
Brady Statistic AS VS Percent: 1 %
Brady Statistic RA Percent Paced: 45 %
Brady Statistic RV Percent Paced: 99 %
Date Time Interrogation Session: 20231027020016
Implantable Lead Connection Status: 753985
Implantable Lead Connection Status: 753985
Implantable Lead Implant Date: 20181014
Implantable Lead Implant Date: 20181014
Implantable Lead Location: 753859
Implantable Lead Location: 753860
Implantable Pulse Generator Implant Date: 20181014
Lead Channel Impedance Value: 440 Ohm
Lead Channel Impedance Value: 530 Ohm
Lead Channel Pacing Threshold Amplitude: 0.5 V
Lead Channel Pacing Threshold Amplitude: 0.875 V
Lead Channel Pacing Threshold Pulse Width: 0.5 ms
Lead Channel Pacing Threshold Pulse Width: 0.5 ms
Lead Channel Sensing Intrinsic Amplitude: 2.3 mV
Lead Channel Sensing Intrinsic Amplitude: 4.6 mV
Lead Channel Setting Pacing Amplitude: 1.125
Lead Channel Setting Pacing Amplitude: 2 V
Lead Channel Setting Pacing Pulse Width: 0.5 ms
Lead Channel Setting Sensing Sensitivity: 4 mV
Pulse Gen Model: 2272
Pulse Gen Serial Number: 8951116

## 2021-11-05 DIAGNOSIS — H903 Sensorineural hearing loss, bilateral: Secondary | ICD-10-CM | POA: Insufficient documentation

## 2021-11-08 NOTE — Progress Notes (Signed)
Remote pacemaker transmission.   

## 2022-02-01 ENCOUNTER — Ambulatory Visit (INDEPENDENT_AMBULATORY_CARE_PROVIDER_SITE_OTHER): Payer: Medicare Other

## 2022-02-01 DIAGNOSIS — I442 Atrioventricular block, complete: Secondary | ICD-10-CM | POA: Diagnosis not present

## 2022-02-01 LAB — CUP PACEART REMOTE DEVICE CHECK
Battery Remaining Longevity: 50 mo
Battery Remaining Percentage: 43 %
Battery Voltage: 2.98 V
Brady Statistic AP VP Percent: 48 %
Brady Statistic AP VS Percent: 1 %
Brady Statistic AS VP Percent: 52 %
Brady Statistic AS VS Percent: 1 %
Brady Statistic RA Percent Paced: 47 %
Brady Statistic RV Percent Paced: 99 %
Date Time Interrogation Session: 20240126020015
Implantable Lead Connection Status: 753985
Implantable Lead Connection Status: 753985
Implantable Lead Implant Date: 20181014
Implantable Lead Implant Date: 20181014
Implantable Lead Location: 753859
Implantable Lead Location: 753860
Implantable Pulse Generator Implant Date: 20181014
Lead Channel Impedance Value: 460 Ohm
Lead Channel Impedance Value: 530 Ohm
Lead Channel Pacing Threshold Amplitude: 0.5 V
Lead Channel Pacing Threshold Amplitude: 0.75 V
Lead Channel Pacing Threshold Pulse Width: 0.5 ms
Lead Channel Pacing Threshold Pulse Width: 0.5 ms
Lead Channel Sensing Intrinsic Amplitude: 12 mV
Lead Channel Sensing Intrinsic Amplitude: 2 mV
Lead Channel Setting Pacing Amplitude: 1 V
Lead Channel Setting Pacing Amplitude: 2 V
Lead Channel Setting Pacing Pulse Width: 0.5 ms
Lead Channel Setting Sensing Sensitivity: 4 mV
Pulse Gen Model: 2272
Pulse Gen Serial Number: 8951116

## 2022-02-18 NOTE — Progress Notes (Signed)
Remote pacemaker transmission.   

## 2022-04-22 DIAGNOSIS — S0101XA Laceration without foreign body of scalp, initial encounter: Secondary | ICD-10-CM | POA: Diagnosis not present

## 2022-04-22 DIAGNOSIS — W19XXXA Unspecified fall, initial encounter: Secondary | ICD-10-CM | POA: Diagnosis not present

## 2022-05-02 DIAGNOSIS — F02818 Dementia in other diseases classified elsewhere, unspecified severity, with other behavioral disturbance: Secondary | ICD-10-CM | POA: Diagnosis not present

## 2022-05-02 DIAGNOSIS — S0101XD Laceration without foreign body of scalp, subsequent encounter: Secondary | ICD-10-CM | POA: Diagnosis not present

## 2022-05-02 DIAGNOSIS — G309 Alzheimer's disease, unspecified: Secondary | ICD-10-CM | POA: Diagnosis not present

## 2022-05-03 ENCOUNTER — Ambulatory Visit (INDEPENDENT_AMBULATORY_CARE_PROVIDER_SITE_OTHER): Payer: Medicare Other

## 2022-05-03 DIAGNOSIS — I442 Atrioventricular block, complete: Secondary | ICD-10-CM | POA: Diagnosis not present

## 2022-05-03 LAB — CUP PACEART REMOTE DEVICE CHECK
Battery Remaining Longevity: 47 mo
Battery Remaining Percentage: 40 %
Battery Voltage: 2.96 V
Brady Statistic AP VP Percent: 48 %
Brady Statistic AP VS Percent: 1 %
Brady Statistic AS VP Percent: 52 %
Brady Statistic AS VS Percent: 1 %
Brady Statistic RA Percent Paced: 47 %
Brady Statistic RV Percent Paced: 99 %
Date Time Interrogation Session: 20240426020012
Implantable Lead Connection Status: 753985
Implantable Lead Connection Status: 753985
Implantable Lead Implant Date: 20181014
Implantable Lead Implant Date: 20181014
Implantable Lead Location: 753859
Implantable Lead Location: 753860
Implantable Pulse Generator Implant Date: 20181014
Lead Channel Impedance Value: 480 Ohm
Lead Channel Impedance Value: 480 Ohm
Lead Channel Pacing Threshold Amplitude: 0.5 V
Lead Channel Pacing Threshold Amplitude: 1 V
Lead Channel Pacing Threshold Pulse Width: 0.5 ms
Lead Channel Pacing Threshold Pulse Width: 0.5 ms
Lead Channel Sensing Intrinsic Amplitude: 12 mV
Lead Channel Sensing Intrinsic Amplitude: 2 mV
Lead Channel Setting Pacing Amplitude: 1.25 V
Lead Channel Setting Pacing Amplitude: 2 V
Lead Channel Setting Pacing Pulse Width: 0.5 ms
Lead Channel Setting Sensing Sensitivity: 4 mV
Pulse Gen Model: 2272
Pulse Gen Serial Number: 8951116

## 2022-05-28 NOTE — Progress Notes (Signed)
Remote pacemaker transmission.   

## 2022-07-08 DIAGNOSIS — I119 Hypertensive heart disease without heart failure: Secondary | ICD-10-CM | POA: Diagnosis not present

## 2022-08-02 ENCOUNTER — Ambulatory Visit: Payer: Medicare Other

## 2022-08-02 DIAGNOSIS — I442 Atrioventricular block, complete: Secondary | ICD-10-CM | POA: Diagnosis not present

## 2022-08-02 LAB — CUP PACEART REMOTE DEVICE CHECK
Battery Remaining Longevity: 43 mo
Battery Remaining Percentage: 38 %
Battery Voltage: 2.96 V
Brady Statistic AP VP Percent: 48 %
Brady Statistic AP VS Percent: 1 %
Brady Statistic AS VP Percent: 52 %
Brady Statistic AS VS Percent: 1 %
Brady Statistic RA Percent Paced: 47 %
Brady Statistic RV Percent Paced: 99 %
Date Time Interrogation Session: 20240726020014
Implantable Lead Connection Status: 753985
Implantable Lead Connection Status: 753985
Implantable Lead Implant Date: 20181014
Implantable Lead Implant Date: 20181014
Implantable Lead Location: 753859
Implantable Lead Location: 753860
Implantable Pulse Generator Implant Date: 20181014
Lead Channel Impedance Value: 430 Ohm
Lead Channel Impedance Value: 480 Ohm
Lead Channel Pacing Threshold Amplitude: 0.5 V
Lead Channel Pacing Threshold Amplitude: 0.875 V
Lead Channel Pacing Threshold Pulse Width: 0.5 ms
Lead Channel Pacing Threshold Pulse Width: 0.5 ms
Lead Channel Sensing Intrinsic Amplitude: 12 mV
Lead Channel Sensing Intrinsic Amplitude: 2 mV
Lead Channel Setting Pacing Amplitude: 1.125
Lead Channel Setting Pacing Amplitude: 2 V
Lead Channel Setting Pacing Pulse Width: 0.5 ms
Lead Channel Setting Sensing Sensitivity: 4 mV
Pulse Gen Model: 2272
Pulse Gen Serial Number: 8951116

## 2022-08-09 NOTE — Progress Notes (Signed)
Remote pacemaker transmission.   

## 2022-09-04 ENCOUNTER — Encounter: Payer: Self-pay | Admitting: Cardiology

## 2022-09-04 ENCOUNTER — Ambulatory Visit: Payer: Medicare Other | Attending: Cardiology | Admitting: Cardiology

## 2022-09-04 VITALS — BP 108/66 | HR 73 | Ht 62.0 in | Wt 137.8 lb

## 2022-09-04 DIAGNOSIS — I6523 Occlusion and stenosis of bilateral carotid arteries: Secondary | ICD-10-CM | POA: Diagnosis not present

## 2022-09-04 DIAGNOSIS — Z952 Presence of prosthetic heart valve: Secondary | ICD-10-CM

## 2022-09-04 DIAGNOSIS — I10 Essential (primary) hypertension: Secondary | ICD-10-CM

## 2022-09-04 DIAGNOSIS — I442 Atrioventricular block, complete: Secondary | ICD-10-CM | POA: Diagnosis not present

## 2022-09-04 DIAGNOSIS — R0609 Other forms of dyspnea: Secondary | ICD-10-CM

## 2022-09-04 NOTE — Patient Instructions (Addendum)
Medication Instructions:  Your physician recommends that you continue on your current medications as directed. Please refer to the Current Medication list given to you today.  *If you need a refill on your cardiac medications before your next appointment, please call your pharmacy*   Lab Work: None Ordered If you have labs (blood work) drawn today and your tests are completely normal, you will receive your results only by: MyChart Message (if you have MyChart) OR A paper copy in the mail If you have any lab test that is abnormal or we need to change your treatment, we will call you to review the results.   Testing/Procedures: Your physician has requested that you have an echocardiogram. Echocardiography is a painless test that uses sound waves to create images of your heart. It provides your doctor with information about the size and shape of your heart and how well your heart's chambers and valves are working. This procedure takes approximately one hour. There are no restrictions for this procedure. Please do NOT wear cologne, perfume, aftershave, or lotions (deodorant is allowed). Please arrive 15 minutes prior to your appointment time.   Your physician has requested that you have a carotid duplex. This test is an ultrasound of the carotid arteries in your neck. It looks at blood flow through these arteries that supply the brain with blood. Allow one hour for this exam. There are no restrictions or special instructions.    Follow-Up: At [facility], you and your health needs are our priority.  As part of our continuing mission to provide you with exceptional heart care, we have created designated Provider Care Teams.  These Care Teams include your primary Cardiologist (physician) and Advanced Practice Providers (APPs -  Physician Assistants and Nurse Practitioners) who all work together to provide you with the care you need, when you need it.  We recommend signing up for the patient  portal called "MyChart".  Sign up information is provided on this After Visit Summary.  MyChart is used to connect with patients for Virtual Visits (Telemedicine).  Patients are able to view lab/test results, encounter notes, upcoming appointments, etc.  Non-urgent messages can be sent to your provider as well.   To learn more about what you can do with MyChart, go to [email or web address].    Your next appointment:   6 month(s)  The format for your next appointment:   In Person  Provider:   Robert Krasowski, MD    Other Instructions NA  

## 2022-09-04 NOTE — Progress Notes (Unsigned)
Cardiology Office Note:    Date:  09/04/2022   ID:  Robin Sutton, DOB [birth year], MRN [id number]  PCP:  Everlean Cherry, MD  Cardiologist:  Gypsy Balsam, MD    Referring MD: Everlean Cherry, MD   Chief Complaint  Patient presents with   Follow-up    History of Present Illness:    Robin Sutton is a 87 y.o. female with past medical history significant for severe aortic stenosis that required intervention.  In 2019 she had TAVI done with 23 mm Medtronic evolute pro valve inserted via right femoral approach.  And after that she ended having complete heart block that required pacemaker she does have a [employer] Jude device additional problem include coronary arterial disease not critical so far, history of CVA without residual changes, essential hypertension, dyslipidemia. Comes today to my office for follow-up.  Overall doing fine her daughter who is like always with her complained that she is deteriorating mentally but otherwise denies have any chest pain tightness squeezing pressure burning chest  Past Medical History:  Diagnosis Date   Carotid stenosis, bilateral 04/19/2015   Formatting of this note might be different from the original. 1-39% bilaterally   CHB (complete heart block) ([facility]) 09/30/2018   CKD (chronic kidney disease), stage III (HCC) 10/02/2018   COVID 02/2020   Dyslipidemia 07/09/2016   Essential hypertension 07/09/2016   Gait disturbance 01/03/2020   History of CVA (cerebrovascular accident)    History of CVA (cerebrovascular accident) without residual deficits 04/19/2015   HLD (hyperlipidemia)    Hypertension    Hypertensive heart disease without heart failure 09/30/2018   Hypertensive heart/kidney disease without HF and with CKD stage III (HCC) 10/02/2018   Memory problem 02/12/2019   Mixed hyperlipidemia 04/19/2015   Pacemaker 12/03/2016   St Jude Nov 2018   S/P placement of cardiac pacemaker    a. developed CHB s/p TAVR and got a St. Jude Medical Assurity MRI  compatible dual chamber [id number] (serial number  [id number])   S/P TAVR (transcatheter aortic valve replacement)    a. 10/2016: 23 mm Medtronic Evolut Pro transcatheter heart valve placed via percutaneous right transfemoral approach    Severe aortic stenosis 07/09/2016   Moderate based on echo from May 2017 Severe by echo from July 2018 mean gradient of 47  Formatting of this note might be different from the original. Moderate based on echo from May 2017 Severe by echo from July 2018 mean gradient of 47    Past Surgical History:  Procedure Laterality Date   BREAST BIOPSY     PACEMAKER IMPLANT N/A 10/20/2016   St Jude Medical Assurity MRI conditional  dual-chamber pacemaker for symptomatic complete heart block by Dr Johney Frame   RIGHT/LEFT HEART CATH AND CORONARY ANGIOGRAPHY N/A 09/11/2016   Procedure: RIGHT/LEFT HEART CATH AND CORONARY ANGIOGRAPHY;  Surgeon: Tonny Bollman, MD;  Location: [facility] INVASIVE CV LAB;  Service: Cardiovascular;  Laterality: N/A;   TEE WITHOUT CARDIOVERSION N/A 10/17/2016   Procedure: TRANSESOPHAGEAL ECHOCARDIOGRAM (TEE);  Surgeon: Tonny Bollman, MD;  Location: [facility] OR;  Service: Open Heart Surgery;  Laterality: N/A;   TONSILLECTOMY     TRANSCATHETER AORTIC VALVE REPLACEMENT, TRANSFEMORAL N/A 10/17/2016   Procedure: TRANSCATHETER AORTIC VALVE REPLACEMENT, TRANSFEMORAL;  Surgeon: Tonny Bollman, MD;  Location: [facility] OR;  Service: Open Heart Surgery;  Laterality: N/A;    Current Medications: Current Meds  Medication Sig   alendronate (FOSAMAX) 70 MG tablet Take 1 tablet by mouth once a week.   aspirin EC 81 MG  EC tablet Take 1 tablet (81 mg total) by mouth daily.   Calcium Carb-Cholecalciferol (CALCIUM + D3 PO) Take 2 tablets by mouth daily.   Cholecalciferol (VITAMIN D3) 1000 units CAPS Take 1 Capful by mouth daily.   Coenzyme Q10 200 MG capsule Take 200 mg by mouth daily.   donepezil (ARICEPT) 5 MG tablet Take 5 mg by mouth at bedtime.   escitalopram (LEXAPRO) 5 MG tablet Take 5  mg by mouth daily.   lisinopril (ZESTRIL) 10 MG tablet Take 10 mg by mouth daily.   nitroGLYCERIN (NITROSTAT) 0.4 MG SL tablet Place 1 tablet (0.4 mg total) under the tongue every 5 (five) minutes as needed for chest pain.   rosuvastatin (CRESTOR) 20 MG tablet Take 20 mg by mouth daily.   vitamin E 1000 UNIT capsule Take 1,000 Units by mouth daily.     Allergies:   Adhesive [tape]   Social History   Socioeconomic History   Marital status: Widowed    Spouse name: Not on file   Number of children: Not on file   Years of education: Not on file   Highest education level: Not on file  Occupational History   Not on file  Tobacco Use   Smoking status: Never   Smokeless tobacco: Never  Vaping Use   Vaping status: Never Used  Substance and Sexual Activity   Alcohol use: No   Drug use: No   Sexual activity: Not on file  Other Topics Concern   Not on file  Social History Narrative   Not on file   Social Determinants of Health   Financial Resource Strain: Not on file  Food Insecurity: Low Risk  (07/04/2022)   Received from [facility]   Food vital sign    Within the past 12 months, you worried that your food would run out before you got money to buy more: Never true    Within the past 12 months, the food you bought just didn't last and you didn't have money to get more. : Never true  Transportation Needs: Not on file (07/04/2022)  Physical Activity: Not on file  Stress: Not on file  Social Connections: Not on file     Family History: The patient's family history includes Diabetes in her maternal grandfather; Heart disease in her brother, maternal grandfather, and mother; Hypertension in her brother and mother; Kidney failure in her father; Stroke in her mother. ROS:   Please see the history of present illness.    All 14 point review of systems negative except as described per history of present illness  EKGs/Labs/Other Studies Reviewed:    EKG  Interpretation Date/Time:  Wednesday September 04 2022 14:51:51 EDT Ventricular Rate:  73 PR Interval:  208 QRS Duration:  160 QT Interval:  438 QTC Calculation: 482 R Axis:   -77  Text Interpretation: Atrial-sensed ventricular-paced rhythm Abnormal ECG When compared with ECG of 21-Oct-2016 06:51, Vent. rate has decreased BY  10 BPM Confirmed by Gypsy Balsam [phone]) on 09/04/2022 3:21:17 PM    Recent Labs: No results found for requested labs within last 365 days.  Recent Lipid Panel No results found for: "CHOL", "TRIG", "HDL", "CHOLHDL", "VLDL", "LDLCALC", "LDLDIRECT"  Physical Exam:    VS:  BP 108/66 (BP Location: Left Arm, Patient Position: Sitting)   Pulse 73   Ht 5\' 2"  (1.575 m)   Wt 137 lb 12.8 oz (62.5 kg)   SpO2 92%   BMI 25.20 kg/m     Wt Readings  from Last 3 Encounters:  09/04/22 137 lb 12.8 oz (62.5 kg)  10/22/21 153 lb (69.4 kg)  10/22/21 153 lb (69.4 kg)     GEN:  Well nourished, well developed in no acute distress HEENT: Normal NECK: No JVD; No carotid bruits LYMPHATICS: No lymphadenopathy CARDIAC: RRR, systolic ejection murmur grade 1/6 to 2/6 pressure right upper portion of the sternum, no rubs, no gallops RESPIRATORY:  Clear to auscultation without rales, wheezing or rhonchi  ABDOMEN: Soft, non-tender, non-distended MUSCULOSKELETAL:  No edema; No deformity  SKIN: Warm and dry LOWER EXTREMITIES: no swelling NEUROLOGIC:  Alert and oriented x 3 PSYCHIATRIC:  Normal affect   ASSESSMENT:    1. Complete AV block (HCC)   2. Carotid stenosis, bilateral   3. CHB (complete heart block) (HCC)   4. Essential hypertension   5. S/P TAVR (transcatheter aortic valve replacement)    PLAN:    In order of problems listed above:  Complete heart block.  Pacemaker present normal function continue monitoring. Cortical renal disease.  Will schedule have carotic ultrasound. Essential hypertension blood pressure well-controlled continue present management. Status  post TAVI.  Will schedule her to have echocardiogram   Medication Adjustments/Labs and Tests Ordered: Current medicines are reviewed at length with the patient today.  Concerns regarding medicines are outlined above.  Orders Placed This Encounter  Procedures   EKG 12-Lead   Medication changes: No orders of the defined types were placed in this encounter.   Signed, Georgeanna Lea, MD, [facility] 09/04/2022 3:21 PM    [facility]

## 2022-09-23 ENCOUNTER — Ambulatory Visit (INDEPENDENT_AMBULATORY_CARE_PROVIDER_SITE_OTHER): Payer: Medicare Other | Admitting: Podiatry

## 2022-09-23 DIAGNOSIS — Z91199 Patient's noncompliance with other medical treatment and regimen due to unspecified reason: Secondary | ICD-10-CM

## 2022-09-23 NOTE — Progress Notes (Signed)
Patient absent for apointment

## 2022-10-07 ENCOUNTER — Ambulatory Visit: Payer: Medicare Other

## 2022-11-01 ENCOUNTER — Ambulatory Visit (INDEPENDENT_AMBULATORY_CARE_PROVIDER_SITE_OTHER): Payer: Medicare Other

## 2022-11-01 ENCOUNTER — Ambulatory Visit: Payer: Medicare Other | Attending: Cardiology

## 2022-11-01 DIAGNOSIS — R0609 Other forms of dyspnea: Secondary | ICD-10-CM | POA: Diagnosis not present

## 2022-11-01 DIAGNOSIS — I6523 Occlusion and stenosis of bilateral carotid arteries: Secondary | ICD-10-CM | POA: Diagnosis not present

## 2022-11-01 DIAGNOSIS — I442 Atrioventricular block, complete: Secondary | ICD-10-CM

## 2022-11-01 LAB — ECHOCARDIOGRAM COMPLETE
AR max vel: 1.28 cm2
AV Peak grad: 6.8 mm[Hg]
Ao pk vel: 1.3 m/s
Calc EF: 67.4 %
MV VTI: 0.86 cm2
S' Lateral: 2 cm
Single Plane A2C EF: 72.2 %
Single Plane A4C EF: 61.8 %

## 2022-11-04 ENCOUNTER — Encounter: Payer: Self-pay | Admitting: Cardiology

## 2022-11-04 ENCOUNTER — Ambulatory Visit: Payer: Medicare Other | Attending: Cardiology | Admitting: Cardiology

## 2022-11-04 VITALS — BP 92/50 | HR 79 | Ht 62.0 in | Wt 134.2 lb

## 2022-11-04 DIAGNOSIS — I10 Essential (primary) hypertension: Secondary | ICD-10-CM

## 2022-11-04 DIAGNOSIS — I442 Atrioventricular block, complete: Secondary | ICD-10-CM

## 2022-11-04 LAB — CUP PACEART REMOTE DEVICE CHECK
Battery Remaining Longevity: 40 mo
Battery Remaining Percentage: 35 %
Battery Voltage: 2.96 V
Brady Statistic AP VP Percent: 46 %
Brady Statistic AP VS Percent: 1 %
Brady Statistic AS VP Percent: 54 %
Brady Statistic AS VS Percent: 1 %
Brady Statistic RA Percent Paced: 45 %
Brady Statistic RV Percent Paced: 99 %
Date Time Interrogation Session: 20241025020014
Implantable Lead Connection Status: 753985
Implantable Lead Connection Status: 753985
Implantable Lead Implant Date: 20181014
Implantable Lead Implant Date: 20181014
Implantable Lead Location: 753859
Implantable Lead Location: 753860
Implantable Pulse Generator Implant Date: 20181014
Lead Channel Impedance Value: 450 Ohm
Lead Channel Impedance Value: 490 Ohm
Lead Channel Pacing Threshold Amplitude: 0.5 V
Lead Channel Pacing Threshold Amplitude: 1.125 V
Lead Channel Pacing Threshold Pulse Width: 0.5 ms
Lead Channel Pacing Threshold Pulse Width: 0.5 ms
Lead Channel Sensing Intrinsic Amplitude: 12 mV
Lead Channel Sensing Intrinsic Amplitude: 2.6 mV
Lead Channel Setting Pacing Amplitude: 1.375
Lead Channel Setting Pacing Amplitude: 2 V
Lead Channel Setting Pacing Pulse Width: 0.5 ms
Lead Channel Setting Sensing Sensitivity: 4 mV
Pulse Gen Model: 2272
Pulse Gen Serial Number: 8951116

## 2022-11-04 NOTE — Patient Instructions (Signed)
Medication Instructions:  Your physician has recommended you make the following change in your medication:  STOP Lisinopril  *If you need a refill on your cardiac medications before your next appointment, please call your pharmacy*   Lab Work: None ordered   Testing/Procedures: None ordered   Follow-Up: At [facility], you and your health needs are our priority.  As part of our continuing mission to provide you with exceptional heart care, we have created designated Provider Care Teams.  These Care Teams include your primary Cardiologist (physician) and Advanced Practice Providers (APPs -  Physician Assistants and Nurse Practitioners) who all work together to provide you with the care you need, when you need it.  Remote monitoring is used to monitor your Pacemaker or ICD from home. This monitoring reduces the number of office visits required to check your device to one time per year. It allows [place] to keep an eye on the functioning of your device to ensure it is working properly. You are scheduled for a device check from home on 01/31/2023. You may send your transmission at any time that day. If you have a wireless device, the transmission will be sent automatically. After your physician reviews your transmission, you will receive a postcard with your next transmission date.  Your next appointment:   1 year(s)  The format for your next appointment:   In Person  Provider:   Loman Brooklyn, MD    Thank you for choosing [facility]!!   Dory Horn, RN [phone]

## 2022-11-04 NOTE — Progress Notes (Signed)
Electrophysiology Office Note:   Date:  11/04/2022  ID:  Robin Sutton, DOB September 02, 1935, MRN [id number]  Primary Cardiologist: Gypsy Balsam, MD Electrophysiologist: Regan Lemming, MD      History of Present Illness:   Robin Sutton is a 87 y.o. female with h/o hypertension, hyperlipidemia, aortic stenosis post TAVR, CVA, complete heart block seen today for routine electrophysiology followup.   Since last being seen in our clinic the patient reports doing overall well.  She has no acute complaints.  Her daughter does state that her blood pressure is low at times, and she feels weak, but otherwise no major issues.  she denies chest pain, palpitations, dyspnea, PND, orthopnea, nausea, vomiting, dizziness, syncope, edema, weight gain, or early satiety.   Review of systems complete and found to be negative unless listed in HPI.      EP Information / Studies Reviewed:    EKG is not ordered today. EKG from 09/04/22 reviewed which showed A sense, V pace      PPM Interrogation-  reviewed in detail today,  See PACEART report.  Device History: Abbott Dual Chamber PPM implanted 2018 for CHB  Risk Assessment/Calculations:              Physical Exam:   VS:  BP (!) 92/50   Pulse 79   Ht 5\' 2"  (1.575 m)   Wt 134 lb 3.2 oz (60.9 kg)   SpO2 98%   BMI 24.55 kg/m    Wt Readings from Last 3 Encounters:  11/04/22 134 lb 3.2 oz (60.9 kg)  09/04/22 137 lb 12.8 oz (62.5 kg)  10/22/21 153 lb (69.4 kg)     GEN: Well nourished, well developed in no acute distress NECK: No JVD; No carotid bruits CARDIAC: Regular rate and rhythm, no murmurs, rubs, gallops RESPIRATORY:  Clear to auscultation without rales, wheezing or rhonchi  ABDOMEN: Soft, non-tender, non-distended EXTREMITIES:  No edema; No deformity   ASSESSMENT AND PLAN:    CHB s/p Abbott PPM  Normal PPM function See Pace Art report No changes today  2.  Aortic stenosis: Post TAVR.  Plan per primary cardiology  3.   Hypertension: Low today.  Greenley Martone stop lisinopril.  Can be further managed by primary physician.  Disposition:   Follow up with Dr. Elberta Fortis in 12 months  Signed, Mandee Pluta Jorja Loa, MD

## 2022-11-09 LAB — CUP PACEART INCLINIC DEVICE CHECK
Battery Remaining Longevity: 42 mo
Battery Voltage: 2.96 V
Brady Statistic RA Percent Paced: 45 %
Brady Statistic RV Percent Paced: 99.96 %
Date Time Interrogation Session: 20241028145859
Implantable Lead Connection Status: 753985
Implantable Lead Connection Status: 753985
Implantable Lead Implant Date: 20181014
Implantable Lead Implant Date: 20181014
Implantable Lead Location: 753859
Implantable Lead Location: 753860
Implantable Pulse Generator Implant Date: 20181014
Lead Channel Impedance Value: 475 Ohm
Lead Channel Impedance Value: 575 Ohm
Lead Channel Pacing Threshold Amplitude: 0.5 V
Lead Channel Pacing Threshold Amplitude: 0.5 V
Lead Channel Pacing Threshold Amplitude: 1 V
Lead Channel Pacing Threshold Amplitude: 1 V
Lead Channel Pacing Threshold Pulse Width: 0.5 ms
Lead Channel Pacing Threshold Pulse Width: 0.5 ms
Lead Channel Pacing Threshold Pulse Width: 0.5 ms
Lead Channel Pacing Threshold Pulse Width: 0.5 ms
Lead Channel Sensing Intrinsic Amplitude: 12 mV
Lead Channel Sensing Intrinsic Amplitude: 3 mV
Lead Channel Setting Pacing Amplitude: 1.125
Lead Channel Setting Pacing Amplitude: 2 V
Lead Channel Setting Pacing Pulse Width: 0.5 ms
Lead Channel Setting Sensing Sensitivity: 4 mV
Pulse Gen Model: 2272
Pulse Gen Serial Number: 8951116

## 2022-11-19 NOTE — Progress Notes (Signed)
Remote pacemaker transmission.   

## 2022-11-21 ENCOUNTER — Telehealth: Payer: Self-pay

## 2022-11-21 NOTE — Telephone Encounter (Signed)
-----   Message from Gypsy Balsam sent at 11/05/2022  9:03 AM EDT ----- Echocardiogram showed preserved left ventricular ejection fraction, mild mitral valve regurgitation, mild mitral valve stenosis, aortic valve prosthesis functioning properly

## 2022-11-21 NOTE — Telephone Encounter (Signed)
Spoke with Stacey Drain, notifies of results.

## 2022-11-21 NOTE — Telephone Encounter (Signed)
-----   Message from Gypsy Balsam sent at 11/01/2022  3:44 PM EDT ----- Cortical show up to 39% bilaterally, medical therapy is only mild disease

## 2022-11-26 DIAGNOSIS — Z7902 Long term (current) use of antithrombotics/antiplatelets: Secondary | ICD-10-CM | POA: Diagnosis not present

## 2022-11-26 DIAGNOSIS — Z8673 Personal history of transient ischemic attack (TIA), and cerebral infarction without residual deficits: Secondary | ICD-10-CM | POA: Diagnosis not present

## 2022-11-26 DIAGNOSIS — R2689 Other abnormalities of gait and mobility: Secondary | ICD-10-CM | POA: Diagnosis not present

## 2022-11-26 DIAGNOSIS — Z79899 Other long term (current) drug therapy: Secondary | ICD-10-CM | POA: Diagnosis not present

## 2022-11-26 DIAGNOSIS — R4781 Slurred speech: Secondary | ICD-10-CM | POA: Diagnosis not present

## 2022-11-26 DIAGNOSIS — G9341 Metabolic encephalopathy: Secondary | ICD-10-CM | POA: Diagnosis not present

## 2022-11-26 DIAGNOSIS — R531 Weakness: Secondary | ICD-10-CM | POA: Diagnosis not present

## 2022-11-26 DIAGNOSIS — R21 Rash and other nonspecific skin eruption: Secondary | ICD-10-CM | POA: Diagnosis not present

## 2022-11-26 DIAGNOSIS — D649 Anemia, unspecified: Secondary | ICD-10-CM | POA: Diagnosis not present

## 2022-11-26 DIAGNOSIS — Z9989 Dependence on other enabling machines and devices: Secondary | ICD-10-CM | POA: Diagnosis not present

## 2022-11-26 DIAGNOSIS — Z7982 Long term (current) use of aspirin: Secondary | ICD-10-CM | POA: Diagnosis not present

## 2022-11-26 DIAGNOSIS — Z23 Encounter for immunization: Secondary | ICD-10-CM | POA: Diagnosis not present

## 2022-11-26 DIAGNOSIS — G9389 Other specified disorders of brain: Secondary | ICD-10-CM | POA: Diagnosis not present

## 2022-11-26 DIAGNOSIS — F03B0 Unspecified dementia, moderate, without behavioral disturbance, psychotic disturbance, mood disturbance, and anxiety: Secondary | ICD-10-CM | POA: Diagnosis not present

## 2022-11-30 DIAGNOSIS — I361 Nonrheumatic tricuspid (valve) insufficiency: Secondary | ICD-10-CM | POA: Diagnosis not present

## 2022-12-01 DIAGNOSIS — Z7401 Bed confinement status: Secondary | ICD-10-CM | POA: Diagnosis not present

## 2022-12-01 DIAGNOSIS — R531 Weakness: Secondary | ICD-10-CM | POA: Diagnosis not present

## 2023-01-31 ENCOUNTER — Ambulatory Visit (INDEPENDENT_AMBULATORY_CARE_PROVIDER_SITE_OTHER)

## 2023-01-31 DIAGNOSIS — I442 Atrioventricular block, complete: Secondary | ICD-10-CM

## 2023-01-31 LAB — CUP PACEART REMOTE DEVICE CHECK
Battery Remaining Longevity: 36 mo
Battery Remaining Percentage: 32 %
Battery Voltage: 2.95 V
Brady Statistic AP VP Percent: 17 %
Brady Statistic AP VS Percent: 1 %
Brady Statistic AS VP Percent: 83 %
Brady Statistic AS VS Percent: 1 %
Brady Statistic RA Percent Paced: 16 %
Brady Statistic RV Percent Paced: 99 %
Date Time Interrogation Session: 20250124020015
Implantable Lead Connection Status: 753985
Implantable Lead Connection Status: 753985
Implantable Lead Implant Date: 20181014
Implantable Lead Implant Date: 20181014
Implantable Lead Location: 753859
Implantable Lead Location: 753860
Implantable Pulse Generator Implant Date: 20181014
Lead Channel Impedance Value: 440 Ohm
Lead Channel Impedance Value: 480 Ohm
Lead Channel Pacing Threshold Amplitude: 0.5 V
Lead Channel Pacing Threshold Amplitude: 1.25 V
Lead Channel Pacing Threshold Pulse Width: 0.5 ms
Lead Channel Pacing Threshold Pulse Width: 0.5 ms
Lead Channel Sensing Intrinsic Amplitude: 12 mV
Lead Channel Sensing Intrinsic Amplitude: 2.1 mV
Lead Channel Setting Pacing Amplitude: 1.5 V
Lead Channel Setting Pacing Amplitude: 2 V
Lead Channel Setting Pacing Pulse Width: 0.5 ms
Lead Channel Setting Sensing Sensitivity: 4 mV
Pulse Gen Model: 2272
Pulse Gen Serial Number: 8951116

## 2023-03-12 NOTE — Progress Notes (Signed)
 Remote pacemaker transmission.

## 2023-05-02 ENCOUNTER — Ambulatory Visit (INDEPENDENT_AMBULATORY_CARE_PROVIDER_SITE_OTHER)

## 2023-05-02 DIAGNOSIS — I442 Atrioventricular block, complete: Secondary | ICD-10-CM | POA: Diagnosis not present

## 2023-05-02 LAB — CUP PACEART REMOTE DEVICE CHECK
Battery Remaining Longevity: 35 mo
Battery Remaining Percentage: 30 %
Battery Voltage: 2.95 V
Brady Statistic AP VP Percent: 11 %
Brady Statistic AP VS Percent: 1 %
Brady Statistic AS VP Percent: 89 %
Brady Statistic AS VS Percent: 1 %
Brady Statistic RA Percent Paced: 10 %
Brady Statistic RV Percent Paced: 99 %
Date Time Interrogation Session: 20250425020740
Implantable Lead Connection Status: 753985
Implantable Lead Connection Status: 753985
Implantable Lead Implant Date: 20181014
Implantable Lead Implant Date: 20181014
Implantable Lead Location: 753859
Implantable Lead Location: 753860
Implantable Pulse Generator Implant Date: 20181014
Lead Channel Impedance Value: 460 Ohm
Lead Channel Impedance Value: 530 Ohm
Lead Channel Pacing Threshold Amplitude: 0.5 V
Lead Channel Pacing Threshold Amplitude: 1 V
Lead Channel Pacing Threshold Pulse Width: 0.5 ms
Lead Channel Pacing Threshold Pulse Width: 0.5 ms
Lead Channel Sensing Intrinsic Amplitude: 10.2 mV
Lead Channel Sensing Intrinsic Amplitude: 2.1 mV
Lead Channel Setting Pacing Amplitude: 1.25 V
Lead Channel Setting Pacing Amplitude: 2 V
Lead Channel Setting Pacing Pulse Width: 0.5 ms
Lead Channel Setting Sensing Sensitivity: 4 mV
Pulse Gen Model: 2272
Pulse Gen Serial Number: 8951116

## 2023-06-09 NOTE — Progress Notes (Signed)
 Remote pacemaker transmission.

## 2023-08-01 ENCOUNTER — Ambulatory Visit (INDEPENDENT_AMBULATORY_CARE_PROVIDER_SITE_OTHER)

## 2023-08-01 DIAGNOSIS — I442 Atrioventricular block, complete: Secondary | ICD-10-CM | POA: Diagnosis not present

## 2023-08-01 LAB — CUP PACEART REMOTE DEVICE CHECK
Battery Remaining Longevity: 31 mo
Battery Remaining Percentage: 27 %
Battery Voltage: 2.93 V
Brady Statistic AP VP Percent: 8.5 %
Brady Statistic AP VS Percent: 1 %
Brady Statistic AS VP Percent: 91 %
Brady Statistic AS VS Percent: 1 %
Brady Statistic RA Percent Paced: 8 %
Brady Statistic RV Percent Paced: 99 %
Date Time Interrogation Session: 20250725062215
Implantable Lead Connection Status: 753985
Implantable Lead Connection Status: 753985
Implantable Lead Implant Date: 20181014
Implantable Lead Implant Date: 20181014
Implantable Lead Location: 753859
Implantable Lead Location: 753860
Implantable Pulse Generator Implant Date: 20181014
Lead Channel Impedance Value: 480 Ohm
Lead Channel Impedance Value: 480 Ohm
Lead Channel Pacing Threshold Amplitude: 0.5 V
Lead Channel Pacing Threshold Amplitude: 1 V
Lead Channel Pacing Threshold Pulse Width: 0.5 ms
Lead Channel Pacing Threshold Pulse Width: 0.5 ms
Lead Channel Sensing Intrinsic Amplitude: 2.4 mV
Lead Channel Sensing Intrinsic Amplitude: 7.5 mV
Lead Channel Setting Pacing Amplitude: 1.25 V
Lead Channel Setting Pacing Amplitude: 2 V
Lead Channel Setting Pacing Pulse Width: 0.5 ms
Lead Channel Setting Sensing Sensitivity: 4 mV
Pulse Gen Model: 2272
Pulse Gen Serial Number: 8951116

## 2023-08-04 ENCOUNTER — Ambulatory Visit: Payer: Self-pay | Admitting: Cardiology

## 2023-10-09 NOTE — Progress Notes (Signed)
 Remote PPM Transmission

## 2023-10-31 ENCOUNTER — Ambulatory Visit (INDEPENDENT_AMBULATORY_CARE_PROVIDER_SITE_OTHER)

## 2023-10-31 DIAGNOSIS — I442 Atrioventricular block, complete: Secondary | ICD-10-CM | POA: Diagnosis not present

## 2023-11-01 LAB — CUP PACEART REMOTE DEVICE CHECK
Battery Remaining Longevity: 28 mo
Battery Remaining Percentage: 25 %
Battery Voltage: 2.93 V
Brady Statistic AP VP Percent: 8 %
Brady Statistic AP VS Percent: 1 %
Brady Statistic AS VP Percent: 92 %
Brady Statistic AS VS Percent: 1 %
Brady Statistic RA Percent Paced: 7.5 %
Brady Statistic RV Percent Paced: 99 %
Date Time Interrogation Session: 20251024020017
Implantable Lead Connection Status: 753985
Implantable Lead Connection Status: 753985
Implantable Lead Implant Date: 20181014
Implantable Lead Implant Date: 20181014
Implantable Lead Location: 753859
Implantable Lead Location: 753860
Implantable Pulse Generator Implant Date: 20181014
Lead Channel Impedance Value: 430 Ohm
Lead Channel Impedance Value: 460 Ohm
Lead Channel Pacing Threshold Amplitude: 0.5 V
Lead Channel Pacing Threshold Amplitude: 1 V
Lead Channel Pacing Threshold Pulse Width: 0.5 ms
Lead Channel Pacing Threshold Pulse Width: 0.5 ms
Lead Channel Sensing Intrinsic Amplitude: 1.6 mV
Lead Channel Sensing Intrinsic Amplitude: 9 mV
Lead Channel Setting Pacing Amplitude: 1.25 V
Lead Channel Setting Pacing Amplitude: 2 V
Lead Channel Setting Pacing Pulse Width: 0.5 ms
Lead Channel Setting Sensing Sensitivity: 4 mV
Pulse Gen Model: 2272
Pulse Gen Serial Number: 8951116

## 2023-11-04 ENCOUNTER — Ambulatory Visit: Payer: Self-pay | Admitting: Cardiology

## 2023-11-05 NOTE — Progress Notes (Signed)
 Remote PPM Transmission

## 2024-01-30 ENCOUNTER — Ambulatory Visit: Attending: Cardiology

## 2024-01-30 DIAGNOSIS — I442 Atrioventricular block, complete: Secondary | ICD-10-CM | POA: Diagnosis not present

## 2024-02-02 LAB — CUP PACEART REMOTE DEVICE CHECK
Battery Remaining Longevity: 25 mo
Battery Remaining Percentage: 22 %
Battery Voltage: 2.92 V
Brady Statistic AP VP Percent: 12 %
Brady Statistic AP VS Percent: 1 %
Brady Statistic AS VP Percent: 88 %
Brady Statistic AS VS Percent: 1 %
Brady Statistic RA Percent Paced: 11 %
Brady Statistic RV Percent Paced: 99 %
Date Time Interrogation Session: 20260123020034
Implantable Lead Connection Status: 753985
Implantable Lead Connection Status: 753985
Implantable Lead Implant Date: 20181014
Implantable Lead Implant Date: 20181014
Implantable Lead Location: 753859
Implantable Lead Location: 753860
Implantable Pulse Generator Implant Date: 20181014
Lead Channel Impedance Value: 430 Ohm
Lead Channel Impedance Value: 430 Ohm
Lead Channel Pacing Threshold Amplitude: 0.5 V
Lead Channel Pacing Threshold Amplitude: 1 V
Lead Channel Pacing Threshold Pulse Width: 0.5 ms
Lead Channel Pacing Threshold Pulse Width: 0.5 ms
Lead Channel Sensing Intrinsic Amplitude: 0.8 mV
Lead Channel Sensing Intrinsic Amplitude: 9.6 mV
Lead Channel Setting Pacing Amplitude: 1.25 V
Lead Channel Setting Pacing Amplitude: 2 V
Lead Channel Setting Pacing Pulse Width: 0.5 ms
Lead Channel Setting Sensing Sensitivity: 4 mV
Pulse Gen Model: 2272
Pulse Gen Serial Number: 8951116

## 2024-02-03 ENCOUNTER — Ambulatory Visit: Payer: Self-pay | Admitting: Cardiology

## 2024-02-04 NOTE — Progress Notes (Signed)
 Remote PPM Transmission

## 2024-04-30 ENCOUNTER — Ambulatory Visit

## 2024-07-30 ENCOUNTER — Ambulatory Visit

## 2024-10-29 ENCOUNTER — Ambulatory Visit

## 2025-01-28 ENCOUNTER — Ambulatory Visit
